# Patient Record
Sex: Female | Born: 1950 | Race: White | Hispanic: No | Marital: Married | Smoking: Never smoker
Health system: Southern US, Community
[De-identification: ages and names within clinical notes are randomized; demographics above are authoritative.]

## PROBLEM LIST (undated history)

## (undated) DIAGNOSIS — Z85828 Personal history of other malignant neoplasm of skin: Secondary | ICD-10-CM

## (undated) DIAGNOSIS — B019 Varicella without complication: Secondary | ICD-10-CM

## (undated) DIAGNOSIS — C4491 Basal cell carcinoma of skin, unspecified: Secondary | ICD-10-CM

## (undated) DIAGNOSIS — K219 Gastro-esophageal reflux disease without esophagitis: Secondary | ICD-10-CM

## (undated) DIAGNOSIS — B029 Zoster without complications: Secondary | ICD-10-CM

## (undated) DIAGNOSIS — M1990 Unspecified osteoarthritis, unspecified site: Secondary | ICD-10-CM

## (undated) HISTORY — PX: DILATION AND CURETTAGE OF UTERUS: SHX78

## (undated) HISTORY — DX: Unspecified osteoarthritis, unspecified site: M19.90

## (undated) HISTORY — DX: Gastro-esophageal reflux disease without esophagitis: K21.9

## (undated) HISTORY — DX: Basal cell carcinoma of skin, unspecified: C44.91

## (undated) HISTORY — PX: TONSILECTOMY/ADENOIDECTOMY WITH MYRINGOTOMY: SHX6125

## (undated) HISTORY — DX: Varicella without complication: B01.9

## (undated) HISTORY — DX: Zoster without complications: B02.9

## (undated) HISTORY — PX: BASAL CELL CARCINOMA EXCISION: SHX1214

---
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
## 2004-09-23 ENCOUNTER — Ambulatory Visit: Payer: Self-pay | Admitting: Obstetrics and Gynecology

## 2005-10-10 ENCOUNTER — Ambulatory Visit: Payer: Self-pay | Admitting: Obstetrics and Gynecology

## 2006-08-17 ENCOUNTER — Ambulatory Visit: Payer: Self-pay | Admitting: Obstetrics and Gynecology

## 2007-10-29 ENCOUNTER — Ambulatory Visit: Payer: Self-pay | Admitting: Obstetrics and Gynecology

## 2008-01-09 DIAGNOSIS — B029 Zoster without complications: Secondary | ICD-10-CM

## 2008-01-09 DIAGNOSIS — B019 Varicella without complication: Secondary | ICD-10-CM

## 2008-01-09 HISTORY — DX: Zoster without complications: B02.9

## 2008-01-09 HISTORY — DX: Varicella without complication: B01.9

## 2008-10-29 ENCOUNTER — Ambulatory Visit: Payer: Self-pay | Admitting: Unknown Physician Specialty

## 2009-11-02 ENCOUNTER — Ambulatory Visit: Payer: Self-pay | Admitting: Unknown Physician Specialty

## 2009-12-06 ENCOUNTER — Ambulatory Visit: Payer: Self-pay | Admitting: Unknown Physician Specialty

## 2010-10-26 ENCOUNTER — Other Ambulatory Visit: Payer: Self-pay | Admitting: Unknown Physician Specialty

## 2010-11-11 ENCOUNTER — Other Ambulatory Visit: Payer: Self-pay | Admitting: Unknown Physician Specialty

## 2010-11-24 ENCOUNTER — Ambulatory Visit: Payer: Self-pay | Admitting: Unknown Physician Specialty

## 2011-12-04 ENCOUNTER — Ambulatory Visit: Payer: Self-pay | Admitting: Internal Medicine

## 2012-03-29 ENCOUNTER — Encounter: Payer: Self-pay | Admitting: Internal Medicine

## 2012-03-29 ENCOUNTER — Ambulatory Visit (INDEPENDENT_AMBULATORY_CARE_PROVIDER_SITE_OTHER): Payer: PRIVATE HEALTH INSURANCE | Admitting: Internal Medicine

## 2012-03-29 ENCOUNTER — Other Ambulatory Visit (HOSPITAL_COMMUNITY)
Admission: RE | Admit: 2012-03-29 | Discharge: 2012-03-29 | Disposition: A | Discharge status: Home / Self Care | Payer: PRIVATE HEALTH INSURANCE | Source: Ambulatory Visit | Attending: Internal Medicine | Admitting: Internal Medicine

## 2012-03-29 VITALS — BP 125/80 | HR 54 | Temp 97.8°F | Ht 72.0 in | Wt 152.0 lb

## 2012-03-29 DIAGNOSIS — Z1151 Encounter for screening for human papillomavirus (HPV): Secondary | ICD-10-CM | POA: Insufficient documentation

## 2012-03-29 DIAGNOSIS — M1990 Unspecified osteoarthritis, unspecified site: Secondary | ICD-10-CM | POA: Insufficient documentation

## 2012-03-29 DIAGNOSIS — Z139 Encounter for screening, unspecified: Secondary | ICD-10-CM

## 2012-03-29 DIAGNOSIS — N951 Menopausal and female climacteric states: Secondary | ICD-10-CM | POA: Insufficient documentation

## 2012-03-29 DIAGNOSIS — K219 Gastro-esophageal reflux disease without esophagitis: Secondary | ICD-10-CM | POA: Insufficient documentation

## 2012-03-29 DIAGNOSIS — Z01419 Encounter for gynecological examination (general) (routine) without abnormal findings: Secondary | ICD-10-CM | POA: Insufficient documentation

## 2012-03-29 MED ORDER — CONJ ESTROG-MEDROXYPROGEST ACE 0.3-1.5 MG PO TABS: ORAL_TABLET | ORAL | Status: DC

## 2012-03-29 MED ORDER — OMEPRAZOLE 20 MG PO CPDR: 20.0000 mg | DELAYED_RELEASE_CAPSULE | Freq: Every day | ORAL | Status: DC

## 2012-03-29 NOTE — Patient Instructions (Signed)
It was nice meeting you today.  I am glad you have been doing well.  Let me know if you need anything.  

## 2012-03-30 ENCOUNTER — Encounter: Payer: Self-pay | Admitting: Internal Medicine

## 2012-03-30 NOTE — Progress Notes (Signed)
Subjective:    Patient ID: Sarah Perry, female    DOB: 06/09/1950, 61 y.o.   MRN: [id number]  HPI 61 year old female with past history of degenerative arthritis, menopausal syndrome and GERD.  She comes in today to follow up on these issues as well as for a complete physical exam.  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  Mostly involves the lateral aspect.  Walking and squatting aggravates.  Started over the summer.  Taking glucosamine.  Has intermittently taken Mobic.  She has also had problems with hemorrhoids.  No bleeding.  Some pain.  No significant constipation.  Had her colonoscopy 2012.  Had labs at work.  States cholesterol ok. On omeprazole.  Controls upper symptoms.  Discussed prempro.  Trying to taper.  Taking every third day now and having some hot flashes and it is affecting her sleep.    Past Medical History  Diagnosis Date  . Degenerative arthritis     hips, knees  . Varicella zoster 10/09  . GERD (gastroesophageal reflux disease)   . Basal cell carcinoma     Current Outpatient Prescriptions on File Prior to Visit  Medication Sig Dispense Refill  . calcium-vitamin D (CALCIUM 500+D) 500-400 MG-UNIT per tablet Take 1 tablet by mouth 2 (two) times daily.      Marland Kitchen estrogen, conjugated,-medroxyprogesterone (PREMPRO) 0.3-1.5 MG per tablet Take one tablet every other day  90 tablet  1  . omeprazole (PRILOSEC) 20 MG capsule Take 1 capsule (20 mg total) by mouth daily.  90 capsule  3    Review of Systems Patient denies any headache, lightheadedness or dizziness.  No sinus or allergy symptoms.  No chest pain, tightness or palpitations.  No increased shortness of breath, cough or congestion.  No nausea or vomiting.  Acid reflux controlled on prilosec.  No abdominal pain or cramping.  No bowel change, such as diarrhea, constipation, BRBPR or melana.  Some problems with hemorrhoids.  No urine change.   No vaginal symptoms.  Some hot  flashes.  Affecting her sleep.      Objective:   Physical Exam Filed Vitals:   03/29/12 0812  BP: 125/80  Pulse: 54  Temp: 97.8 F (88.94 C)   61 year old female in no acute distress.   HEENT:  Nares- clear.  Oropharynx - without lesions. NECK:  Supple.  Nontender.  No audible bruit.  HEART:  Appears to be regular. LUNGS:  No crackles or wheezing audible.  Respirations even and unlabored.  RADIAL PULSE:  Equal bilaterally.    BREASTS:  No nipple discharge or nipple retraction present.  Could not appreciate any distinct nodules or axillary adenopathy.  ABDOMEN:  Soft, nontender.  Bowel sounds present and normal.  No audible abdominal bruit.  GU:  Normal external genitalia.  Vaginal vault without lesions.  Cervix identified.  Pap performed. Could not appreciate any adnexal masses or tenderness.   RECTAL:  Heme negative.   EXTREMITIES:  No increased edema present.  DP pulses palpable and equal bilaterally.      MSK.  Some increased pain with resistance against flexion - right knee.  No instability of the knee joint.      Assessment & Plan:  KNEE PAIN.  Has taken mobic.  Will xray.  May need referral.  Limiting her walking.    HEMORRHOIDS.  Annusol HC suppositories.  Follow.   ESTROGEN USAGE. Discussed estrogen.  Will restart  prempro qod.  Follow.  Will try to taper off.   HEALTH MAINTENANCE.  Physical today.  Schedule mammogram.  Up to date with colonoscopy.  Had in 2012 (?).  Up to date with bone density.

## 2012-03-30 NOTE — Assessment & Plan Note (Signed)
On Prempro.  Taper off as outlined.  Follow.

## 2012-03-30 NOTE — Assessment & Plan Note (Signed)
Reflux controlled on Prilosec.  Follow.   

## 2012-03-30 NOTE — Assessment & Plan Note (Signed)
Hip is better.  Has the knee pain as outlined.  Check xray.

## 2012-04-01 ENCOUNTER — Ambulatory Visit: Payer: Self-pay | Admitting: Internal Medicine

## 2012-04-05 ENCOUNTER — Telehealth: Payer: Self-pay | Admitting: Internal Medicine

## 2012-04-05 NOTE — Telephone Encounter (Signed)
Left detailed message on patient's cell.

## 2012-04-05 NOTE — Telephone Encounter (Signed)
If she has increased pain with walking and it is limiting her walking now - I would recommend to go ahead with physical therapy.  Let me know and I can place order.  If she decides to hold off, we will follow.

## 2012-04-05 NOTE — Telephone Encounter (Signed)
Notify pt knee xray revealed no acute fracture.  Normal alignment.  No acute abnormality.  Given persistent pain, would like to refer to physical therapy for evaluation and treatment.

## 2012-04-05 NOTE — Telephone Encounter (Signed)
Called patient with results. Patient stated that walking a lot on knee hurts, however patient would like to wait until weather is warmer and she begins her regular exercise to see if she is still having pain. She wanted me to ask you if you thought it was a mistake to wait or should she go ahead and seek therapist now instead of holding off? Please advise

## 2012-04-14 ENCOUNTER — Telehealth: Payer: Self-pay | Admitting: Internal Medicine

## 2012-04-14 NOTE — Telephone Encounter (Signed)
Sarah Perry needs a follow up appt with me in 4 months.  Apparently she left without scheduling.  Thanks.

## 2012-04-23 NOTE — Telephone Encounter (Signed)
Scheduled

## 2012-04-29 ENCOUNTER — Encounter: Payer: Self-pay | Admitting: Internal Medicine

## 2012-05-09 ENCOUNTER — Ambulatory Visit: Payer: Self-pay | Admitting: Internal Medicine

## 2012-05-21 ENCOUNTER — Encounter: Payer: Self-pay | Admitting: Internal Medicine

## 2012-05-25 ENCOUNTER — Other Ambulatory Visit: Payer: Self-pay

## 2012-06-23 ENCOUNTER — Encounter: Payer: Self-pay | Admitting: Internal Medicine

## 2012-06-25 ENCOUNTER — Telehealth: Payer: Self-pay | Admitting: Internal Medicine

## 2012-06-25 DIAGNOSIS — M25561 Pain in right knee: Secondary | ICD-10-CM

## 2012-06-25 NOTE — Telephone Encounter (Signed)
Left detailed message on patient's home voicemail.  

## 2012-06-25 NOTE — Telephone Encounter (Signed)
Notify pt I have placed an order for referral to physical therapy (through the hospital) - for persistent knee pain.  Amber should be calling with an appt.

## 2012-07-02 ENCOUNTER — Encounter: Payer: Self-pay | Admitting: Internal Medicine

## 2012-07-09 ENCOUNTER — Encounter: Payer: Self-pay | Admitting: Internal Medicine

## 2012-08-26 ENCOUNTER — Ambulatory Visit (INDEPENDENT_AMBULATORY_CARE_PROVIDER_SITE_OTHER): Payer: 59 | Admitting: Internal Medicine

## 2012-08-26 ENCOUNTER — Encounter: Payer: Self-pay | Admitting: Internal Medicine

## 2012-08-26 ENCOUNTER — Encounter: Payer: Self-pay | Admitting: Emergency Medicine

## 2012-08-26 VITALS — BP 120/80 | HR 58 | Temp 98.3°F | Ht 72.0 in | Wt 155.5 lb

## 2012-08-26 DIAGNOSIS — N951 Menopausal and female climacteric states: Secondary | ICD-10-CM

## 2012-08-26 DIAGNOSIS — M1990 Unspecified osteoarthritis, unspecified site: Secondary | ICD-10-CM

## 2012-08-26 DIAGNOSIS — K219 Gastro-esophageal reflux disease without esophagitis: Secondary | ICD-10-CM

## 2012-08-26 DIAGNOSIS — N898 Other specified noninflammatory disorders of vagina: Secondary | ICD-10-CM

## 2012-08-26 MED ORDER — MELOXICAM 7.5 MG PO TABS: 7.5000 mg | ORAL_TABLET | Freq: Every day | ORAL | Status: DC | PRN

## 2012-08-26 NOTE — Progress Notes (Signed)
Subjective:    Patient ID: Sarah Perry, female    DOB: [birth year], 62 y.o.   MRN: [id number]  HPI 62 year old female with past history of degenerative arthritis, menopausal syndrome and GERD.  She comes in today for a scheduled follow up.   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  See last note for details.  Walking and squatting aggravates.   Intermittently takes Mobic.  Had xray.  Went to therapy.  She is doing the exercises as instructed.  Had her colonoscopy 2012.  On omeprazole.  Controls upper symptoms.  Discussed prempro.  Was trying to taper.  Was taking every third day and was having some hot flashes.  Affecting her sleep.  She is now taking it qod and doing well with this.  States she has a persistent vaginal discharge.  If she skips her prempro - discharge is darker.  No cramping.      Past Medical History  Diagnosis Date  . Degenerative arthritis     hips, knees  . Varicella zoster 10/09  . GERD (gastroesophageal reflux disease)   . Basal cell carcinoma     Current Outpatient Prescriptions on File Prior to Visit  Medication Sig Dispense Refill  . calcium-vitamin D (CALCIUM 500+D) 500-400 MG-UNIT per tablet Take 1 tablet by mouth 2 (two) times daily.      Marland Kitchen estrogen, conjugated,-medroxyprogesterone (PREMPRO) 0.3-1.5 MG per tablet Take one tablet every other day  90 tablet  1  . glucosamine-chondroitin 500-400 MG tablet Take 1 tablet by mouth daily.      . Multiple Vitamin (MULTIVITAMIN) tablet Take 1 tablet by mouth daily.      Marland Kitchen omeprazole (PRILOSEC) 20 MG capsule Take 1 capsule (20 mg total) by mouth daily.  90 capsule  3   No current facility-administered medications on file prior to visit.    Review of Systems Patient denies any headache, lightheadedness or dizziness.  No sinus or allergy symptoms.  No chest pain, tightness or palpitations.  No increased shortness of breath, cough or congestion.  No nausea or vomiting.   Acid reflux controlled on prilosec.  No abdominal pain or cramping.  No bowel change, such as diarrhea, constipation, BRBPR or melana.  No urine change.   Vaginal discharge as outlined.        Objective:   Physical Exam  Filed Vitals:   08/26/12 0807  BP: 120/80  Pulse: 58  Temp: 98.3 F (59.24 C)   63 year old female in no acute distress.   HEENT:  Nares- clear.  Oropharynx - without lesions. NECK:  Supple.  Nontender.  No audible bruit.  HEART:  Appears to be regular. LUNGS:  No crackles or wheezing audible.  Respirations even and unlabored.  RADIAL PULSE:  Equal bilaterally.  ABDOMEN:  Soft, nontender.  Bowel sounds present and normal.  No audible abdominal bruit.  EXTREMITIES:  No increased edema present.  DP pulses palpable and equal bilaterally.      Assessment & Plan:  KNEE PAIN.  Has taken mobic.  Refilled.  Had xray.  Referred to therapy.  Will notify me if she feels she needs something more.     HEMORRHOIDS.  Did not report as an issue today.  Follow.   GYN.  On prempro qod. Hot flashes resolved.  With the discharge as outlined.  Refer to gyn for evaluation and question of need for further w/up (ie pelvic  US, endometrial bx, etc).  Will refer to Dr Greggory Keen.    HEALTH MAINTENANCE.  Physical last visit.  Mammogram 05/09/12 - Birads II.   Up to date with colonoscopy.  Had in 2012.   Up to date with bone density.

## 2012-09-02 ENCOUNTER — Encounter: Payer: Self-pay | Admitting: Internal Medicine

## 2012-09-02 NOTE — Assessment & Plan Note (Signed)
Hip is better.  Knee pain as outlined. Xray as outlined. Has been to physical therapy.  Doing the exercises.  Follow.  Will notify me if she desires any further intervention.

## 2012-09-02 NOTE — Assessment & Plan Note (Signed)
On Prempro.  Refer to gyn as outlined.  Vaginal discharge.

## 2012-09-02 NOTE — Assessment & Plan Note (Signed)
Reflux controlled on Prilosec.  Follow.   

## 2013-02-13 ENCOUNTER — Other Ambulatory Visit: Payer: Self-pay

## 2013-03-03 ENCOUNTER — Other Ambulatory Visit: Payer: Self-pay | Admitting: *Deleted

## 2013-03-03 ENCOUNTER — Encounter: Payer: Self-pay | Admitting: Internal Medicine

## 2013-03-03 MED ORDER — CONJ ESTROG-MEDROXYPROGEST ACE 0.3-1.5 MG PO TABS: ORAL_TABLET | ORAL | Status: DC

## 2013-03-28 ENCOUNTER — Encounter: Payer: 59 | Admitting: Internal Medicine

## 2013-03-31 ENCOUNTER — Ambulatory Visit (INDEPENDENT_AMBULATORY_CARE_PROVIDER_SITE_OTHER): Payer: 59 | Admitting: Internal Medicine

## 2013-03-31 ENCOUNTER — Encounter: Payer: Self-pay | Admitting: Internal Medicine

## 2013-03-31 VITALS — BP 110/70 | HR 61 | Temp 98.4°F | Ht 71.0 in | Wt 157.5 lb

## 2013-03-31 DIAGNOSIS — Z1239 Encounter for other screening for malignant neoplasm of breast: Secondary | ICD-10-CM

## 2013-03-31 DIAGNOSIS — Z13220 Encounter for screening for lipoid disorders: Secondary | ICD-10-CM

## 2013-03-31 DIAGNOSIS — N951 Menopausal and female climacteric states: Secondary | ICD-10-CM

## 2013-03-31 DIAGNOSIS — M1990 Unspecified osteoarthritis, unspecified site: Secondary | ICD-10-CM

## 2013-03-31 DIAGNOSIS — K219 Gastro-esophageal reflux disease without esophagitis: Secondary | ICD-10-CM

## 2013-03-31 MED ORDER — OMEPRAZOLE 20 MG PO CPDR: 20.0000 mg | DELAYED_RELEASE_CAPSULE | Freq: Every day | ORAL | Status: DC

## 2013-03-31 MED ORDER — CONJ ESTROG-MEDROXYPROGEST ACE 0.3-1.5 MG PO TABS: ORAL_TABLET | ORAL | Status: DC

## 2013-03-31 NOTE — Progress Notes (Signed)
Pre-visit discussion using our clinic review tool. No additional management support is needed unless otherwise documented below in the visit note.  

## 2013-03-31 NOTE — Progress Notes (Signed)
Subjective:    Patient ID: Sarah Perry, female    DOB: 04-Jan-1951, 62 y.o.   MRN: [id number]  HPI 62 year old female with past history of degenerative arthritis, menopausal syndrome and GERD.  She comes in today to follow up on these issues as well as for a complete physical exam.   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  See last note for details.  Walking and squatting aggravates.   Intermittently takes Mobic.  Had xray.  Went to therapy.  She is doing the exercises as instructed.  Had her colonoscopy 2012.  On omeprazole.  Controls upper symptoms.  Discussed prempro.  Was trying to taper.  Was taking every third day and was having some hot flashes.  Affecting her sleep.  She is now taking it qod and doing well with this.  Controls her symptoms.  Had gyn evaluation for her vaginal discharge.  States everything checked out fine.  Discharge resolved.  No further w/up felt warranted.   Overall she feels good.      Past Medical History  Diagnosis Date  . Degenerative arthritis     hips, knees  . Varicella zoster 10/09  . GERD (gastroesophageal reflux disease)   . Basal cell carcinoma     Current Outpatient Prescriptions on File Prior to Visit  Medication Sig Dispense Refill  . calcium-vitamin D (CALCIUM 500+D) 500-400 MG-UNIT per tablet Take 1 tablet by mouth 2 (two) times daily.      Marland Kitchen estrogen, conjugated,-medroxyprogesterone (PREMPRO) 0.3-1.5 MG per tablet Take one tablet every other day  15 tablet  0  . glucosamine-chondroitin 500-400 MG tablet Take 1 tablet by mouth daily.      . meloxicam (MOBIC) 7.5 MG tablet Take 1 tablet (7.5 mg total) by mouth daily as needed for pain.  30 tablet  1  . Multiple Vitamin (MULTIVITAMIN) tablet Take 1 tablet by mouth daily.      Marland Kitchen omeprazole (PRILOSEC) 20 MG capsule Take 1 capsule (20 mg total) by mouth daily.  90 capsule  3   No current facility-administered medications on file prior to visit.     Review of Systems Patient denies any headache, lightheadedness or dizziness.  No sinus or allergy symptoms.  No chest pain, tightness or palpitations.  No increased shortness of breath, cough or congestion.  No nausea or vomiting.  Acid reflux controlled on prilosec.  No abdominal pain or cramping.  No bowel change, such as diarrhea, constipation, BRBPR or melana.  No urine change.   Vaginal discharge resolved.  Saw gyn  Felt no further w/up warranted.  Overall she feels she is doing well.       Objective:   Physical Exam  Filed Vitals:   03/31/13 0807  BP: 110/70  Pulse: 61  Temp: 98.4 F (11.74 C)   62 year old female in no acute distress.   HEENT:  Nares- clear.  Oropharynx - without lesions. NECK:  Supple.  Nontender.  No audible bruit.  HEART:  Appears to be regular. LUNGS:  No crackles or wheezing audible.  Respirations even and unlabored.  RADIAL PULSE:  Equal bilaterally.    BREASTS:  No nipple discharge or nipple retraction present.  Could not appreciate any distinct nodules or axillary adenopathy.  ABDOMEN:  Soft, nontender.  Bowel sounds present and normal.  No audible abdominal bruit.  GU:  Not performed.    EXTREMITIES:  No increased  edema present.  DP pulses palpable and equal bilaterally.          Assessment & Plan:  KNEE PAIN.  Mobic prn.  Had xray.  Referred to therapy.  Will notify me if she feels she needs something more.     GYN.  On prempro.  Wants to remain on the prempro.  No further vaginal discharge.  Saw gyn.  Felt no further w/up warranted.     HEALTH MAINTENANCE.  Physical today.   Mammogram 05/09/12 - Birads II.   Schedule a f/u mammogram when due.  Up to date with colonoscopy.  Had in 2012.   Up to date with bone density.

## 2013-04-04 ENCOUNTER — Encounter: Payer: Self-pay | Admitting: Internal Medicine

## 2013-04-04 NOTE — Assessment & Plan Note (Signed)
Reflux controlled on Prilosec.  Follow.   

## 2013-04-04 NOTE — Assessment & Plan Note (Signed)
On Prempro.  Refer to gyn as outlined.  Discharged resolved.  No further w/up felt warranted.

## 2013-04-04 NOTE — Assessment & Plan Note (Signed)
Hip is better.  Knee pain as outlined. Xray as outlined. Has been to physical therapy.  Doing the exercises.  Follow.  Will notify me if she desires any further intervention.  Stable.

## 2013-04-09 ENCOUNTER — Other Ambulatory Visit (INDEPENDENT_AMBULATORY_CARE_PROVIDER_SITE_OTHER): Payer: 59

## 2013-04-09 DIAGNOSIS — K219 Gastro-esophageal reflux disease without esophagitis: Secondary | ICD-10-CM

## 2013-04-09 DIAGNOSIS — Z13220 Encounter for screening for lipoid disorders: Secondary | ICD-10-CM

## 2013-04-09 LAB — LIPID PANEL
Cholesterol: 200 mg/dL (ref 0–200)
Triglycerides: 37 mg/dL (ref 0.0–149.0)

## 2013-04-09 LAB — COMPREHENSIVE METABOLIC PANEL
BUN: 18 mg/dL (ref 6–23)
CO2: 32 mEq/L (ref 19–32)
GFR: 78.82 mL/min (ref >60.00)
Glucose, Bld: 91 mg/dL (ref 70–99)
Sodium: 140 mEq/L (ref 135–145)
Total Bilirubin: 0.7 mg/dL (ref 0.3–1.2)
Total Protein: 6.6 g/dL (ref 6.0–8.3)

## 2013-04-09 LAB — CBC WITH DIFFERENTIAL/PLATELET
Basophils Relative: 0.7 % (ref 0.0–3.0)
Eosinophils Relative: 2.5 % (ref 0.0–5.0)
HCT: 37.3 % (ref 36.0–46.0)
Hemoglobin: 12.7 g/dL (ref 12.0–15.0)
Lymphs Abs: 1.4 10*3/uL (ref 0.7–4.0)
Monocytes Relative: 8.1 % (ref 3.0–12.0)
Neutro Abs: 2.7 10*3/uL (ref 1.4–7.7)
RBC: 4.09 Mil/uL (ref 3.87–5.11)
WBC: 4.6 10*3/uL (ref 4.5–10.5)

## 2013-04-09 LAB — TSH: TSH: 3 u[IU]/mL (ref 0.35–5.50)

## 2013-04-10 ENCOUNTER — Encounter: Payer: Self-pay | Admitting: Internal Medicine

## 2013-05-12 ENCOUNTER — Encounter: Payer: Self-pay | Admitting: Internal Medicine

## 2013-05-12 ENCOUNTER — Ambulatory Visit: Payer: Self-pay | Admitting: Internal Medicine

## 2013-05-12 LAB — HM MAMMOGRAPHY: HM MAMMO: NEGATIVE

## 2013-08-18 ENCOUNTER — Other Ambulatory Visit: Payer: Self-pay | Admitting: Internal Medicine

## 2013-10-01 ENCOUNTER — Ambulatory Visit (INDEPENDENT_AMBULATORY_CARE_PROVIDER_SITE_OTHER): Payer: 59 | Admitting: Internal Medicine

## 2013-10-01 ENCOUNTER — Encounter: Payer: Self-pay | Admitting: Internal Medicine

## 2013-10-01 VITALS — BP 110/70 | HR 63 | Temp 98.2°F | Ht 71.0 in | Wt 152.5 lb

## 2013-10-01 DIAGNOSIS — L989 Disorder of the skin and subcutaneous tissue, unspecified: Secondary | ICD-10-CM

## 2013-10-01 DIAGNOSIS — K219 Gastro-esophageal reflux disease without esophagitis: Secondary | ICD-10-CM

## 2013-10-01 DIAGNOSIS — N951 Menopausal and female climacteric states: Secondary | ICD-10-CM

## 2013-10-01 DIAGNOSIS — M25569 Pain in unspecified knee: Secondary | ICD-10-CM

## 2013-10-01 NOTE — Progress Notes (Signed)
Pre visit review using our clinic review tool, if applicable. No additional management support is needed unless otherwise documented below in the visit note. 

## 2013-10-05 ENCOUNTER — Encounter: Payer: Self-pay | Admitting: Internal Medicine

## 2013-10-05 DIAGNOSIS — M25569 Pain in unspecified knee: Secondary | ICD-10-CM | POA: Insufficient documentation

## 2013-10-05 DIAGNOSIS — L989 Disorder of the skin and subcutaneous tissue, unspecified: Secondary | ICD-10-CM | POA: Insufficient documentation

## 2013-10-05 NOTE — Assessment & Plan Note (Signed)
Had xray and went to therapy.  Takes ibuprofen prn.  Stable.  Desires no further intervention.  Follow.

## 2013-10-05 NOTE — Assessment & Plan Note (Signed)
Reflux controlled on Prilosec.  Follow.

## 2013-10-05 NOTE — Assessment & Plan Note (Signed)
On Prempro.  Referred to gyn as outlined.  Discharged resolved.  No further w/up felt warranted.

## 2013-10-05 NOTE — Progress Notes (Signed)
Subjective:    Patient ID: Sarah Perry, female    DOB: [birth year], 63 y.o.   MRN: [id number]  HPI 63 year old female with past history of degenerative arthritis, menopausal syndrome and GERD.  She comes in today for a scheduled follow up.   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  Works at the hospital.   Some intermittent knee pain.  Had xray.  Went to therapy.  She is doing the exercises as instructed. Desires no further intervention.   Had her colonoscopy 2012.  On omeprazole.  Controls upper symptoms.  Discussed prempro.  Was trying to taper.  Was taking every third day and was having some hot flashes.  Affecting her sleep.  She is now taking it qod and doing well with this.  Controls her symptoms.  Had gyn evaluation for her vaginal discharge.  States everything checked out fine.  Discharge resolved.  No further w/up felt warranted.   Overall she feels good.      Past Medical History  Diagnosis Date  . Degenerative arthritis     hips, knees  . Varicella zoster 10/09  . GERD (gastroesophageal reflux disease)   . Basal cell carcinoma     Current Outpatient Prescriptions on File Prior to Visit  Medication Sig Dispense Refill  . calcium-vitamin D (CALCIUM 500+D) 500-400 MG-UNIT per tablet Take 1 tablet by mouth 2 (two) times daily.      Marland Kitchen glucosamine-chondroitin 500-400 MG tablet Take 1 tablet by mouth daily.      . Multiple Vitamin (MULTIVITAMIN) tablet Take 1 tablet by mouth daily.      Marland Kitchen omeprazole (PRILOSEC) 20 MG capsule Take 1 capsule (20 mg total) by mouth daily.  90 capsule  3  . PREMPRO 0.3-1.5 MG per tablet TAKE ONE TABLET EVERY OTHER DAY  28 tablet  5   No current facility-administered medications on file prior to visit.    Review of Systems Patient denies any headache, lightheadedness or dizziness.  No sinus or allergy symptoms.  No chest pain, tightness or palpitations.  No increased shortness of breath, cough or congestion.  No nausea or vomiting.  Acid reflux controlled on  prilosec.  No abdominal pain or cramping.  No bowel change, such as diarrhea, constipation, BRBPR or melana.  No urine change.   Vaginal discharge resolved.  Saw gyn  Felt no further w/up warranted.  Overall she feels she is doing well.       Objective:   Physical Exam  Filed Vitals:   10/01/13 0755  BP: 110/70  Pulse: 63  Temp: 98.2 F (51.31 C)   63 year old female in no acute distress.   HEENT:  Nares- clear.  Oropharynx - without lesions. NECK:  Supple.  Nontender.  No audible bruit.  HEART:  Appears to be regular. LUNGS:  No crackles or wheezing audible.  Respirations even and unlabored.  RADIAL PULSE:  Equal bilaterally.   ABDOMEN:  Soft, nontender.  Bowel sounds present and normal.  No audible abdominal bruit.   EXTREMITIES:  No increased edema present.  DP pulses palpable and equal bilaterally.          Assessment & Plan:  GYN.  On prempro.  Wants to remain on the prempro.  No further vaginal discharge.  Saw gyn.  Felt no further w/up warranted.     HEALTH MAINTENANCE.  Physical 03/31/13.   Mammogram 05/12/13 - Birads I.   Up to date with colonoscopy.  Had in 2012.  Up to date with bone density.

## 2013-10-05 NOTE — Assessment & Plan Note (Signed)
Persistent right knee lesion and leg lesion.  Refer back to dermatology.

## 2013-10-27 ENCOUNTER — Encounter: Payer: Self-pay | Admitting: Internal Medicine

## 2014-03-27 ENCOUNTER — Encounter: Payer: Self-pay | Admitting: Internal Medicine

## 2014-03-27 ENCOUNTER — Ambulatory Visit (INDEPENDENT_AMBULATORY_CARE_PROVIDER_SITE_OTHER): Payer: 59 | Admitting: Internal Medicine

## 2014-03-27 VITALS — BP 110/70 | HR 68 | Temp 98.5°F | Ht 70.5 in | Wt 156.0 lb

## 2014-03-27 DIAGNOSIS — Z13220 Encounter for screening for lipoid disorders: Secondary | ICD-10-CM

## 2014-03-27 DIAGNOSIS — N951 Menopausal and female climacteric states: Secondary | ICD-10-CM

## 2014-03-27 DIAGNOSIS — K219 Gastro-esophageal reflux disease without esophagitis: Secondary | ICD-10-CM

## 2014-03-27 DIAGNOSIS — L989 Disorder of the skin and subcutaneous tissue, unspecified: Secondary | ICD-10-CM

## 2014-03-27 DIAGNOSIS — Z1239 Encounter for other screening for malignant neoplasm of breast: Secondary | ICD-10-CM

## 2014-03-27 DIAGNOSIS — M25569 Pain in unspecified knee: Secondary | ICD-10-CM

## 2014-03-27 NOTE — Progress Notes (Signed)
Pre visit review using our clinic review tool, if applicable. No additional management support is needed unless otherwise documented below in the visit note. 

## 2014-03-27 NOTE — Progress Notes (Signed)
Subjective:    Patient ID: Sarah Perry, female    DOB: 06-Apr-1951, 63 y.o.   MRN: [id number]  HPI 63 year old female with past history of degenerative arthritis, menopausal syndrome and GERD.  She comes in today to follow up on these issues as well as for a complete physical exam.  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  Works at the hospital.   Some intermittent knee pain.  Had xray.  Went to therapy.  She is doing the exercises as instructed. Desires no further intervention.   Had her colonoscopy 2012.  On omeprazole.  Controls upper symptoms.  Discussed prempro.  Was trying to taper.  Was taking every third day and was having some hot flashes.  Affecting her sleep.  She is now taking it qod and doing well with this.  We again disussed a slow taper.  Will try.   Had gyn evaluation for her vaginal discharge.  States everything checked out fine.  Discharge resolved.  No further w/up felt warranted.   Overall she feels good.      Past Medical History  Diagnosis Date  . Degenerative arthritis     hips, knees  . Varicella zoster 10/09  . GERD (gastroesophageal reflux disease)   . Basal cell carcinoma     Current Outpatient Prescriptions on File Prior to Visit  Medication Sig Dispense Refill  . calcium-vitamin D (CALCIUM 500+D) 500-400 MG-UNIT per tablet Take 1 tablet by mouth daily.     Marland Kitchen glucosamine-chondroitin 500-400 MG tablet Take 2 tablets by mouth daily.     Marland Kitchen ibuprofen (ADVIL,MOTRIN) 200 MG tablet Take 200 mg by mouth daily.    . Multiple Vitamin (MULTIVITAMIN) tablet Take 1 tablet by mouth daily.    Marland Kitchen omeprazole (PRILOSEC) 20 MG capsule Take 1 capsule (20 mg total) by mouth daily. 90 capsule 3  . PREMPRO 0.3-1.5 MG per tablet TAKE ONE TABLET EVERY OTHER DAY 28 tablet 5   No current facility-administered medications on file prior to visit.    Review of Systems Patient denies any headache, lightheadedness or dizziness.  No sinus or allergy symptoms.  No chest pain, tightness or  palpitations. No increased shortness of breath, cough or congestion.  No nausea or vomiting.  Acid reflux controlled on prilosec.  No abdominal pain or cramping.  No bowel change, such as diarrhea, constipation, BRBPR or melana.  No urine change.   Vaginal discharge resolved.  Saw gyn  Felt no further w/up warranted.  Overall she feels she is doing well.  Stays active.  Knee ok.       Objective:   Physical Exam  Filed Vitals:   03/27/14 0834  BP: 110/70  Pulse: 68  Temp: 98.5 F (36.9 C)   Blood pressure recheck 30/89  63 year old female in no acute distress.   HEENT:  Nares- clear.  Oropharynx - without lesions. NECK:  Supple.  Nontender.  No audible bruit.  HEART:  Appears to be regular. LUNGS:  No crackles or wheezing audible.  Respirations even and unlabored.  RADIAL PULSE:  Equal bilaterally.    BREASTS:  No nipple discharge or nipple retraction present.  Could not appreciate any distinct nodules or axillary adenopathy.  ABDOMEN:  Soft, nontender.  Bowel sounds present and normal.  No audible abdominal bruit.  GU:  Not performed.    EXTREMITIES:  No increased edema present.  DP pulses palpable and equal bilaterally.          Assessment &  Plan:  1. Breast cancer screening - MM DIGITAL SCREENING BILATERAL; Future  2. Gastroesophageal reflux disease without esophagitis Symptoms controlled.  On omeprazole.    - CBC with Differential; Future - Comprehensive metabolic panel; Future  3. Menopausal syndrome See above for slow taper instructions.  Symptoms controlled on qod dosing.   - TSH; Future  4. Skin lesions Saw Willeen Niece.  See her note for details.  Continue to f/u with dermatology.    5. Knee pain, unspecified laterality Stable.  Better.  Continue exercise.  Follow.    6. Screening cholesterol level Low cholesterol diet.   - Lipid panel; Future  7. GYN.  On prempro.  We discussed tapering the prempro gradually.  She will again attempt a slow taper.  No  further vaginal discharge.  Saw gyn.  Felt no further w/up warranted.     HEALTH MAINTENANCE.  Physical today.   Mammogram 05/12/13 - Birads I.   Schedule f/u mammogram.  Up to date with colonoscopy. Had in 2012.   States due f/u in 10 years.  Up to date with bone density.   I spent 25 minutes with the patient and more than 50% of the time was spent in consultation regarding the above.

## 2014-04-05 ENCOUNTER — Encounter: Payer: Self-pay | Admitting: Internal Medicine

## 2014-04-22 ENCOUNTER — Other Ambulatory Visit (INDEPENDENT_AMBULATORY_CARE_PROVIDER_SITE_OTHER): Payer: 59

## 2014-04-22 DIAGNOSIS — N951 Menopausal and female climacteric states: Secondary | ICD-10-CM

## 2014-04-22 DIAGNOSIS — Z13220 Encounter for screening for lipoid disorders: Secondary | ICD-10-CM

## 2014-04-22 DIAGNOSIS — K219 Gastro-esophageal reflux disease without esophagitis: Secondary | ICD-10-CM

## 2014-04-22 LAB — CBC WITH DIFFERENTIAL/PLATELET
Basophils Absolute: 0 10*3/uL (ref 0.0–0.1)
Basophils Relative: 0.9 % (ref 0.0–3.0)
Eosinophils Absolute: 0.1 10*3/uL (ref 0.0–0.7)
Eosinophils Relative: 2.2 % (ref 0.0–5.0)
HCT: 38.3 % (ref 36.0–46.0)
HEMOGLOBIN: 12.7 g/dL (ref 12.0–15.0)
LYMPHS PCT: 34.8 % (ref 12.0–46.0)
Lymphs Abs: 1.7 10*3/uL (ref 0.7–4.0)
MCHC: 33.1 g/dL (ref 30.0–36.0)
MCV: 91 fl (ref 78.0–100.0)
MONOS PCT: 8.5 % (ref 3.0–12.0)
Monocytes Absolute: 0.4 10*3/uL (ref 0.1–1.0)
NEUTROS ABS: 2.5 10*3/uL (ref 1.4–7.7)
Neutrophils Relative %: 53.6 % (ref 43.0–77.0)
PLATELETS: 231 10*3/uL (ref 150.0–400.0)
RBC: 4.21 Mil/uL (ref 3.87–5.11)
RDW: 12.4 % (ref 11.5–15.5)
WBC: 4.8 10*3/uL (ref 4.0–10.5)

## 2014-04-22 LAB — COMPREHENSIVE METABOLIC PANEL
ALT: 20 U/L (ref 0–35)
AST: 29 U/L (ref 0–37)
Albumin: 4.3 g/dL (ref 3.5–5.2)
Alkaline Phosphatase: 97 U/L (ref 39–117)
BILIRUBIN TOTAL: 0.4 mg/dL (ref 0.2–1.2)
BUN: 11 mg/dL (ref 6–23)
CO2: 29 mEq/L (ref 19–32)
Calcium: 9.6 mg/dL (ref 8.4–10.5)
Chloride: 103 mEq/L (ref 96–112)
Creatinine, Ser: 0.76 mg/dL (ref 0.40–1.20)
GFR: 82.14 mL/min (ref >60.00)
Glucose, Bld: 98 mg/dL (ref 70–99)
Potassium: 4.5 mEq/L (ref 3.5–5.1)
Sodium: 140 mEq/L (ref 135–145)
TOTAL PROTEIN: 7 g/dL (ref 6.0–8.3)

## 2014-04-22 LAB — LIPID PANEL
Cholesterol: 205 mg/dL — ABNORMAL HIGH (ref 0–200)
HDL: 91 mg/dL (ref >39.00)
LDL Cholesterol: 103 mg/dL — ABNORMAL HIGH (ref 0–99)
NONHDL: 114
Total CHOL/HDL Ratio: 2
Triglycerides: 56 mg/dL (ref 0.0–149.0)
VLDL: 11.2 mg/dL (ref 0.0–40.0)

## 2014-04-22 LAB — TSH: TSH: 3.2 u[IU]/mL (ref 0.35–4.50)

## 2014-04-23 ENCOUNTER — Encounter: Payer: Self-pay | Admitting: Internal Medicine

## 2014-04-27 NOTE — Telephone Encounter (Signed)
unread mychart message mailed to patient

## 2014-05-05 ENCOUNTER — Other Ambulatory Visit: Payer: Self-pay | Admitting: Internal Medicine

## 2014-05-13 ENCOUNTER — Ambulatory Visit: Payer: Self-pay | Admitting: Internal Medicine

## 2014-05-13 LAB — HM MAMMOGRAPHY: HM Mammogram: NEGATIVE

## 2014-09-21 ENCOUNTER — Other Ambulatory Visit: Payer: Self-pay | Admitting: Internal Medicine

## 2014-09-21 NOTE — Telephone Encounter (Signed)
ok'd refill prempro #[id number] with no refills.

## 2014-09-21 NOTE — Telephone Encounter (Signed)
Please advise refill.  03/2014 CPE appt you discussed tapering

## 2014-09-23 ENCOUNTER — Encounter: Payer: Self-pay | Admitting: Internal Medicine

## 2014-10-05 ENCOUNTER — Ambulatory Visit: Payer: 59 | Admitting: Internal Medicine

## 2014-11-29 ENCOUNTER — Other Ambulatory Visit: Payer: Self-pay | Admitting: Internal Medicine

## 2014-11-30 ENCOUNTER — Other Ambulatory Visit: Payer: Self-pay | Admitting: *Deleted

## 2014-11-30 ENCOUNTER — Other Ambulatory Visit: Payer: Self-pay | Admitting: Internal Medicine

## 2014-11-30 MED ORDER — CONJ ESTROG-MEDROXYPROGEST ACE 0.3-1.5 MG PO TABS: ORAL_TABLET | ORAL | Status: DC

## 2014-11-30 NOTE — Progress Notes (Signed)
Refilled prempro #28 with no refills.

## 2014-12-08 DIAGNOSIS — C4491 Basal cell carcinoma of skin, unspecified: Secondary | ICD-10-CM

## 2014-12-08 HISTORY — DX: Basal cell carcinoma of skin, unspecified: C44.91

## 2014-12-10 ENCOUNTER — Encounter: Payer: Self-pay | Admitting: Internal Medicine

## 2014-12-10 ENCOUNTER — Ambulatory Visit (INDEPENDENT_AMBULATORY_CARE_PROVIDER_SITE_OTHER): Payer: 59 | Admitting: Internal Medicine

## 2014-12-10 VITALS — BP 110/70 | HR 59 | Temp 98.7°F | Ht 70.5 in | Wt 157.4 lb

## 2014-12-10 DIAGNOSIS — M179 Osteoarthritis of knee, unspecified: Secondary | ICD-10-CM | POA: Diagnosis not present

## 2014-12-10 DIAGNOSIS — K219 Gastro-esophageal reflux disease without esophagitis: Secondary | ICD-10-CM

## 2014-12-10 DIAGNOSIS — R42 Dizziness and giddiness: Secondary | ICD-10-CM | POA: Diagnosis not present

## 2014-12-10 DIAGNOSIS — M1710 Unilateral primary osteoarthritis, unspecified knee: Secondary | ICD-10-CM

## 2014-12-10 NOTE — Progress Notes (Signed)
Pre-visit discussion using our clinic review tool. No additional management support is needed unless otherwise documented below in the visit note.  

## 2014-12-10 NOTE — Progress Notes (Signed)
Patient ID: Sarah Perry, female   DOB: February 12, 1951, 64 y.o.   MRN: [id number]   Subjective:    Patient ID: Sarah Perry, female    DOB: Aug 13, 1950, 64 y.o.   MRN: [id number]  HPI  Patient here for a scheduled follow up.  States she had an episode in 06/2014 - woke up - room spinning.  This is the third time in her life has occurred. Has been years since has happened previously.  Took meclizine previously.  This resolved and she has not had reoccurrence.  No headache.  No dizziness.  Stays active.  No cardiac symptoms with increased activity or exertion.  No sob.  No acid reflux.  No abdominal pain or cramping.  Bowels stable.     Past Medical History  Diagnosis Date  . Degenerative arthritis     hips, knees  . Varicella zoster 10/09  . GERD (gastroesophageal reflux disease)   . Basal cell carcinoma    Past Surgical History  Procedure Laterality Date  . Tonsilectomy/adenoidectomy with myringotomy    . Dilation and curettage of uterus    . Basal cell carcinoma excision     Family History  Problem Relation Age of Onset  . Ovarian cancer Mother   . Dementia Father   . Breast cancer Paternal Aunt   . Prostate cancer Father   . Hypercholesterolemia Mother   . Pancreatic cancer Maternal Aunt    Social History   Social History  . Marital Status: Married    Spouse Name: N/A  . Number of Children: 2  . Years of Education: N/A   Social History Main Topics  . Smoking status: Never Smoker   . Smokeless tobacco: Never Used  . Alcohol Use: 0.0 oz/week    0 Standard drinks or equivalent per week     Comment: glass of wine daily  . Drug Use: No  . Sexual Activity: Not Asked   Other Topics Concern  . None   Social History Narrative    Outpatient Encounter Prescriptions as of 12/10/2014  Medication Sig  . calcium-vitamin D (CALCIUM 500+D) 500-400 MG-UNIT per tablet Take 1 tablet by mouth daily.   Marland Kitchen estrogen, conjugated,-medroxyprogesterone (PREMPRO) 0.3-1.5 MG per tablet TAKE ONE  TABLET EVERY OTHER DAY  . glucosamine-chondroitin 500-400 MG tablet Take 2 tablets by mouth daily.   Marland Kitchen ibuprofen (ADVIL,MOTRIN) 200 MG tablet Take 200 mg by mouth daily.  . Multiple Vitamin (MULTIVITAMIN) tablet Take 1 tablet by mouth daily.  Marland Kitchen omeprazole (PRILOSEC) 20 MG capsule TAKE 1 CAPSULE BY MOUTH DAILY.   No facility-administered encounter medications on file as of 12/10/2014.    Review of Systems  Constitutional: Negative for appetite change and unexpected weight change.  HENT: Negative for congestion and sinus pressure.   Eyes: Negative for discharge and visual disturbance.  Respiratory: Negative for cough, chest tightness and shortness of breath.   Cardiovascular: Negative for chest pain, palpitations and leg swelling.  Gastrointestinal: Negative for nausea, vomiting, abdominal pain and diarrhea.  Genitourinary: Negative for dysuria and difficulty urinating.  Musculoskeletal: Negative for back pain and joint swelling.  Skin: Negative for color change and rash.  Neurological: Negative for dizziness (has resolved. ), light-headedness and headaches.  Hematological: Negative for adenopathy. Does not bruise/bleed easily.  Psychiatric/Behavioral: Negative for dysphoric mood and agitation.       Objective:    Physical Exam  Constitutional: She appears well-developed and well-nourished. No distress.  HENT:  Nose: Nose normal.  Mouth/Throat: Oropharynx is  clear and moist.  Eyes: Conjunctivae are normal. Right eye exhibits no discharge. Left eye exhibits no discharge.  Neck: Neck supple. No thyromegaly present.  Cardiovascular: Normal rate and regular rhythm.   Pulmonary/Chest: Breath sounds normal. No respiratory distress. She has no wheezes.  Abdominal: Soft. Bowel sounds are normal. There is no tenderness.  Musculoskeletal: She exhibits no edema or tenderness.  Lymphadenopathy:    She has no cervical adenopathy.  Skin: No rash noted. No erythema.  Psychiatric: She has a  normal mood and affect. Her behavior is normal.    BP 110/70 mmHg  Pulse 59  Temp(Src) 98.7 F (37.1 C) (Oral)  Ht 5' 10.5" (1.791 m)  Wt 157 lb 6 oz (71.385 kg)  BMI 22.25 kg/m2  SpO2 99% Wt Readings from Last 3 Encounters:  12/10/14 157 lb 6 oz (71.385 kg)  03/27/14 156 lb (70.761 kg)  10/01/13 152 lb 8 oz (69.174 kg)     Lab Results  Component Value Date   WBC 4.8 04/22/2014   HGB 12.7 04/22/2014   HCT 38.3 04/22/2014   PLT 231.0 04/22/2014   GLUCOSE 98 04/22/2014   CHOL 205* 04/22/2014   TRIG 56.0 04/22/2014   HDL 91.00 04/22/2014   LDLCALC 103* 04/22/2014   ALT 20 04/22/2014   AST 29 04/22/2014   NA 140 04/22/2014   K 4.5 04/22/2014   CL 103 04/22/2014   CREATININE 0.76 04/22/2014   BUN 11 04/22/2014   CO2 29 04/22/2014   TSH 3.20 04/22/2014       Assessment & Plan:   Problem List Items Addressed This Visit    Degenerative arthritis    Knee pain is stable.  Not limiting her exercise.  Follow.  She does not feel she needs any further intervention.        Dizziness    Appears to be c/w BPPV.  Resolved.  No reoccurrence.  Discussed with her at length today.  Follow.  Hold on further evaluation.        GERD (gastroesophageal reflux disease) - Primary    Symptoms controlled.  On omeprazole.  Follow.            Dale Selma, MD

## 2014-12-14 ENCOUNTER — Encounter: Payer: Self-pay | Admitting: Internal Medicine

## 2014-12-14 DIAGNOSIS — R42 Dizziness and giddiness: Secondary | ICD-10-CM | POA: Insufficient documentation

## 2014-12-14 NOTE — Assessment & Plan Note (Signed)
Knee pain is stable.  Not limiting her exercise.  Follow.  She does not feel she needs any further intervention.

## 2014-12-14 NOTE — Assessment & Plan Note (Signed)
Symptoms controlled.  On omeprazole.  Follow.

## 2014-12-14 NOTE — Assessment & Plan Note (Signed)
Appears to be c/w BPPV.  Resolved.  No reoccurrence.  Discussed with her at length today.  Follow.  Hold on further evaluation.

## 2015-03-30 ENCOUNTER — Other Ambulatory Visit (HOSPITAL_COMMUNITY)
Admission: RE | Admit: 2015-03-30 | Discharge: 2015-03-30 | Disposition: A | Discharge status: Home / Self Care | Payer: 59 | Source: Ambulatory Visit | Attending: Internal Medicine | Admitting: Internal Medicine

## 2015-03-30 ENCOUNTER — Ambulatory Visit (INDEPENDENT_AMBULATORY_CARE_PROVIDER_SITE_OTHER): Payer: 59 | Admitting: Internal Medicine

## 2015-03-30 ENCOUNTER — Encounter: Payer: Self-pay | Admitting: Internal Medicine

## 2015-03-30 VITALS — BP 128/80 | HR 64 | Temp 97.8°F | Resp 18 | Ht 70.5 in | Wt 162.4 lb

## 2015-03-30 DIAGNOSIS — K219 Gastro-esophageal reflux disease without esophagitis: Secondary | ICD-10-CM | POA: Diagnosis not present

## 2015-03-30 DIAGNOSIS — Z1239 Encounter for other screening for malignant neoplasm of breast: Secondary | ICD-10-CM

## 2015-03-30 DIAGNOSIS — Z01419 Encounter for gynecological examination (general) (routine) without abnormal findings: Secondary | ICD-10-CM | POA: Insufficient documentation

## 2015-03-30 DIAGNOSIS — N951 Menopausal and female climacteric states: Secondary | ICD-10-CM

## 2015-03-30 DIAGNOSIS — Z0000 Encounter for general adult medical examination without abnormal findings: Secondary | ICD-10-CM

## 2015-03-30 DIAGNOSIS — Z1151 Encounter for screening for human papillomavirus (HPV): Secondary | ICD-10-CM | POA: Diagnosis not present

## 2015-03-30 DIAGNOSIS — Z124 Encounter for screening for malignant neoplasm of cervix: Secondary | ICD-10-CM

## 2015-03-30 MED ORDER — CONJ ESTROG-MEDROXYPROGEST ACE 0.3-1.5 MG PO TABS: ORAL_TABLET | ORAL | Status: DC

## 2015-03-30 MED ORDER — OMEPRAZOLE 20 MG PO CPDR: 20.0000 mg | DELAYED_RELEASE_CAPSULE | Freq: Every day | ORAL | Status: DC

## 2015-03-30 NOTE — Assessment & Plan Note (Signed)
Physical today 03/30/15.  PAP today 03/30/15.  Scheduled for mammogram 05/17/15.  Colonoscopy 2012.  Recommended f/u colonoscopy in 10 years (per pt).

## 2015-03-30 NOTE — Assessment & Plan Note (Signed)
On prempro.  Taking qod.  Has tried to decrease to every third day.  Affected her sleeping.  Wants to continue current dose for now.  Will try to taper in the future.

## 2015-03-30 NOTE — Assessment & Plan Note (Signed)
Controlled on omeprazole.   

## 2015-03-30 NOTE — Progress Notes (Signed)
Patient ID: Sarah Perry, female   DOB: 08-21-1950, 64 y.o.   MRN: [id number]   Subjective:    Patient ID: Sarah Perry, female    DOB: 1950/05/30, 64 y.o.   MRN: [id number]  HPI  Patient with past history of GERD and menopausal syndrome who comes in today to follow up on these issues as well as for a complete physical exam.  States she is doing well.  Stays active.  No cardiac symptoms with increased activity or exertion.  No sob.  No acid reflux.  No abdominal pain or cramping.  Bowels stable.  Overall she feels she is doing well.  No discharge.    Past Medical History  Diagnosis Date  . Degenerative arthritis     hips, knees  . Varicella zoster 10/09  . GERD (gastroesophageal reflux disease)   . Basal cell carcinoma    Past Surgical History  Procedure Laterality Date  . Tonsilectomy/adenoidectomy with myringotomy    . Dilation and curettage of uterus    . Basal cell carcinoma excision     Family History  Problem Relation Age of Onset  . Ovarian cancer Mother   . Dementia Father   . Breast cancer Paternal Aunt   . Prostate cancer Father   . Hypercholesterolemia Mother   . Pancreatic cancer Maternal Aunt    Social History   Social History  . Marital Status: Married    Spouse Name: N/A  . Number of Children: 2  . Years of Education: N/A   Social History Main Topics  . Smoking status: Never Smoker   . Smokeless tobacco: Never Used  . Alcohol Use: 0.0 oz/week    0 Standard drinks or equivalent per week     Comment: glass of wine daily  . Drug Use: No  . Sexual Activity: Not Asked   Other Topics Concern  . None   Social History Narrative    Outpatient Encounter Prescriptions as of 03/30/2015  Medication Sig  . calcium-vitamin D (CALCIUM 500+D) 500-400 MG-UNIT per tablet Take 1 tablet by mouth daily.   Marland Kitchen estrogen, conjugated,-medroxyprogesterone (PREMPRO) 0.3-1.5 MG tablet TAKE ONE TABLET EVERY OTHER DAY  . glucosamine-chondroitin 500-400 MG tablet Take 2  tablets by mouth daily.   Marland Kitchen ibuprofen (ADVIL,MOTRIN) 200 MG tablet Take 200 mg by mouth daily.  . Multiple Vitamin (MULTIVITAMIN) tablet Take 1 tablet by mouth daily.  Marland Kitchen omeprazole (PRILOSEC) 20 MG capsule Take 1 capsule (20 mg total) by mouth daily.  . [DISCONTINUED] estrogen, conjugated,-medroxyprogesterone (PREMPRO) 0.3-1.5 MG per tablet TAKE ONE TABLET EVERY OTHER DAY  . [DISCONTINUED] omeprazole (PRILOSEC) 20 MG capsule TAKE 1 CAPSULE BY MOUTH DAILY.   No facility-administered encounter medications on file as of 03/30/2015.    Review of Systems  Constitutional: Negative for appetite change and unexpected weight change.  HENT: Negative for congestion and sinus pressure.   Eyes: Negative for pain and visual disturbance.  Respiratory: Negative for cough, chest tightness and shortness of breath.   Cardiovascular: Negative for chest pain, palpitations and leg swelling.  Gastrointestinal: Negative for nausea, vomiting, abdominal pain and diarrhea.  Genitourinary: Negative for dysuria and difficulty urinating.  Musculoskeletal: Negative for back pain and joint swelling.  Skin: Negative for color change and rash.  Neurological: Negative for dizziness and headaches.  Hematological: Negative for adenopathy. Does not bruise/bleed easily.  Psychiatric/Behavioral: Negative for dysphoric mood and agitation.       Objective:    Physical Exam  Constitutional: She is oriented to  person, place, and time. She appears well-developed and well-nourished. No distress.  HENT:  Nose: Nose normal.  Mouth/Throat: Oropharynx is clear and moist.  Eyes: Right eye exhibits no discharge. Left eye exhibits no discharge. No scleral icterus.  Neck: Neck supple. No thyromegaly present.  Cardiovascular: Normal rate and regular rhythm.   Pulmonary/Chest: Breath sounds normal. No accessory muscle usage. No tachypnea. No respiratory distress. She has no decreased breath sounds. She has no wheezes. She has no  rhonchi. Right breast exhibits no inverted nipple, no mass, no nipple discharge and no tenderness (no axillary adenopathy). Left breast exhibits no inverted nipple, no mass, no nipple discharge and no tenderness (no axilarry adenopathy).  Abdominal: Soft. Bowel sounds are normal. There is no tenderness.  Genitourinary:  Normal external genitalia.  Vaginal vault without lesions.  Cervix identified.  Pap smear performed.  Could not appreciate any adnexal masses or tenderness.    Musculoskeletal: She exhibits no edema or tenderness.  Lymphadenopathy:    She has no cervical adenopathy.  Neurological: She is alert and oriented to person, place, and time.  Skin: Skin is warm. No rash noted. No erythema.  Psychiatric: She has a normal mood and affect. Her behavior is normal.    BP 128/80 mmHg  Pulse 64  Temp(Src) 97.8 F (36.6 C) (Oral)  Resp 18  Ht 5' 10.5" (1.791 m)  Wt 162 lb 6 oz (73.653 kg)  BMI 22.96 kg/m2  SpO2 97% Wt Readings from Last 3 Encounters:  03/30/15 162 lb 6 oz (73.653 kg)  12/10/14 157 lb 6 oz (71.385 kg)  03/27/14 156 lb (70.761 kg)     Lab Results  Component Value Date   WBC 4.8 04/22/2014   HGB 12.7 04/22/2014   HCT 38.3 04/22/2014   PLT 231.0 04/22/2014   GLUCOSE 98 04/22/2014   CHOL 205* 04/22/2014   TRIG 56.0 04/22/2014   HDL 91.00 04/22/2014   LDLCALC 103* 04/22/2014   ALT 20 04/22/2014   AST 29 04/22/2014   NA 140 04/22/2014   K 4.5 04/22/2014   CL 103 04/22/2014   CREATININE 0.76 04/22/2014   BUN 11 04/22/2014   CO2 29 04/22/2014   TSH 3.20 04/22/2014       Assessment & Plan:   Problem List Items Addressed This Visit    GERD (gastroesophageal reflux disease)    Controlled on omeprazole.        Relevant Medications   omeprazole (PRILOSEC) 20 MG capsule   Health care maintenance    Physical today 03/30/15.  PAP today 03/30/15.  Scheduled for mammogram 05/17/15.  Colonoscopy 2012.  Recommended f/u colonoscopy in 10 years (per pt).         Menopausal syndrome    On prempro.  Taking qod.  Has tried to decrease to every third day.  Affected her sleeping.  Wants to continue current dose for now.  Will try to taper in the future.        Relevant Orders   CBC with Differential/Platelet   TSH   Comprehensive metabolic panel    Other Visit Diagnoses    Routine general medical examination at a health care facility    -  Primary    Relevant Orders    Lipid panel    Screening breast examination        Relevant Orders    MM DIGITAL SCREENING BILATERAL    Pap smear for cervical cancer screening        Relevant Orders  Cytology - PAP (Completed)        Dale Morrow, MD

## 2015-03-30 NOTE — Progress Notes (Signed)
Pre-visit discussion using our clinic review tool. No additional management support is needed unless otherwise documented below in the visit note.  

## 2015-03-30 NOTE — Patient Instructions (Signed)
nasacort nasal spray - 2 sprays each nostril one time per day.  Do this in the evening.    Saline nasal spray - flush nose at least 2-3x/day 

## 2015-04-01 ENCOUNTER — Encounter: Payer: Self-pay | Admitting: Internal Medicine

## 2015-04-01 LAB — CYTOLOGY - PAP

## 2015-04-05 ENCOUNTER — Encounter: Payer: Self-pay | Admitting: Internal Medicine

## 2015-04-28 ENCOUNTER — Other Ambulatory Visit (INDEPENDENT_AMBULATORY_CARE_PROVIDER_SITE_OTHER): Payer: 59

## 2015-04-28 DIAGNOSIS — N951 Menopausal and female climacteric states: Secondary | ICD-10-CM | POA: Diagnosis not present

## 2015-04-28 DIAGNOSIS — Z0000 Encounter for general adult medical examination without abnormal findings: Secondary | ICD-10-CM | POA: Diagnosis not present

## 2015-04-28 LAB — CBC WITH DIFFERENTIAL/PLATELET
BASOS ABS: 0.1 10*3/uL (ref 0.0–0.1)
BASOS PCT: 1.2 % (ref 0.0–3.0)
EOS ABS: 0.1 10*3/uL (ref 0.0–0.7)
Eosinophils Relative: 2 % (ref 0.0–5.0)
HCT: 37.3 % (ref 36.0–46.0)
Hemoglobin: 12.6 g/dL (ref 12.0–15.0)
LYMPHS ABS: 1.3 10*3/uL (ref 0.7–4.0)
LYMPHS PCT: 27.3 % (ref 12.0–46.0)
MCHC: 33.8 g/dL (ref 30.0–36.0)
MCV: 90.9 fl (ref 78.0–100.0)
MONO ABS: 0.5 10*3/uL (ref 0.1–1.0)
Monocytes Relative: 11.8 % (ref 3.0–12.0)
NEUTROS ABS: 2.7 10*3/uL (ref 1.4–7.7)
NEUTROS PCT: 57.7 % (ref 43.0–77.0)
PLATELETS: 228 10*3/uL (ref 150.0–400.0)
RBC: 4.11 Mil/uL (ref 3.87–5.11)
RDW: 12.6 % (ref 11.5–15.5)
WBC: 4.7 10*3/uL (ref 4.0–10.5)

## 2015-04-28 LAB — LIPID PANEL
CHOL/HDL RATIO: 2
CHOLESTEROL: 193 mg/dL (ref 0–200)
HDL: 88.4 mg/dL (ref >39.00)
LDL CALC: 93 mg/dL (ref 0–99)
NonHDL: 105
Triglycerides: 58 mg/dL (ref 0.0–149.0)
VLDL: 11.6 mg/dL (ref 0.0–40.0)

## 2015-04-28 LAB — COMPREHENSIVE METABOLIC PANEL
ALT: 21 U/L (ref 0–35)
AST: 30 U/L (ref 0–37)
Albumin: 4.3 g/dL (ref 3.5–5.2)
Alkaline Phosphatase: 81 U/L (ref 39–117)
BUN: 18 mg/dL (ref 6–23)
CHLORIDE: 103 meq/L (ref 96–112)
CO2: 30 mEq/L (ref 19–32)
CREATININE: 0.91 mg/dL (ref 0.40–1.20)
Calcium: 9.7 mg/dL (ref 8.4–10.5)
GFR: 66.5 mL/min (ref >60.00)
Glucose, Bld: 97 mg/dL (ref 70–99)
POTASSIUM: 4.6 meq/L (ref 3.5–5.1)
SODIUM: 141 meq/L (ref 135–145)
Total Bilirubin: 0.5 mg/dL (ref 0.2–1.2)
Total Protein: 6.9 g/dL (ref 6.0–8.3)

## 2015-04-28 LAB — TSH: TSH: 3.63 u[IU]/mL (ref 0.35–4.50)

## 2015-04-29 ENCOUNTER — Encounter: Payer: Self-pay | Admitting: Internal Medicine

## 2015-05-17 ENCOUNTER — Telehealth: Payer: 59 | Admitting: Family

## 2015-05-17 ENCOUNTER — Other Ambulatory Visit: Payer: Self-pay | Admitting: Internal Medicine

## 2015-05-17 ENCOUNTER — Ambulatory Visit
Admission: RE | Admit: 2015-05-17 | Discharge: 2015-05-17 | Disposition: A | Discharge status: Home / Self Care | Payer: 59 | Source: Ambulatory Visit | Attending: Internal Medicine | Admitting: Internal Medicine

## 2015-05-17 DIAGNOSIS — Z1239 Encounter for other screening for malignant neoplasm of breast: Secondary | ICD-10-CM

## 2015-05-17 DIAGNOSIS — B3731 Acute candidiasis of vulva and vagina: Secondary | ICD-10-CM

## 2015-05-17 DIAGNOSIS — B373 Candidiasis of vulva and vagina: Secondary | ICD-10-CM

## 2015-05-17 DIAGNOSIS — Z1231 Encounter for screening mammogram for malignant neoplasm of breast: Secondary | ICD-10-CM | POA: Diagnosis not present

## 2015-05-17 DIAGNOSIS — N3000 Acute cystitis without hematuria: Secondary | ICD-10-CM

## 2015-05-17 MED ORDER — SULFAMETHOXAZOLE-TRIMETHOPRIM 800-160 MG PO TABS: 1.0000 | ORAL_TABLET | Freq: Two times a day (BID) | ORAL | Status: DC

## 2015-05-17 MED ORDER — FLUCONAZOLE 150 MG PO TABS: 150.0000 mg | ORAL_TABLET | Freq: Once | ORAL | Status: DC

## 2015-05-17 NOTE — Progress Notes (Signed)
We are sorry that you are not feeling well.  Here is how we plan to help!  Based on what you shared with me it looks like you most likely have a simple urinary tract infection.  A UTI (Urinary Tract Infection) is a bacterial infection of the bladder.  Most cases of urinary tract infections are simple to treat but a key part of your care is to encourage you to drink plenty of fluids and watch your symptoms carefully.  I have prescribed Bactrim, a sulfa antibiotic, twice a day for 5 days.  Your symptoms should gradually improve. Call us if the burning in your urine worsens, you develop worsening fever, back pain or pelvic pain or if your symptoms do not resolve after completing the antibiotic. A single Diflucan (fluconazole) 150mg  tablet once.  I have electronically sent this prescription into the pharmacy that you have chosen.  Urinary tract infections can be prevented by drinking plenty of water to keep your body hydrated.  Also be sure when you wipe, wipe from front to back and don't hold it in!  If possible, empty your bladder every 4 hours.  Your e-visit answers were reviewed by a board certified advanced clinical practitioner to complete your personal care plan.  Depending on the condition, your plan could have included both over the counter or prescription medications.  If there is a problem please reply  once you have received a response from your provider.  Your safety is important to [place].  If you have drug allergies check your prescription carefully.    You can use MyChart to ask questions about today's visit, request a non-urgent call back, or ask for a work or school excuse for 24 hours related to this e-Visit. If it has been greater than 24 hours you will need to follow up with your provider, or enter a new e-Visit to address those concerns.   You will get an e-mail in the next two days asking about your experience.  I hope that your e-visit has been valuable and will speed your  recovery. Thank you for using e-visits.

## 2015-06-08 DIAGNOSIS — L718 Other rosacea: Secondary | ICD-10-CM | POA: Diagnosis not present

## 2015-06-08 DIAGNOSIS — L853 Xerosis cutis: Secondary | ICD-10-CM | POA: Diagnosis not present

## 2015-06-08 DIAGNOSIS — L814 Other melanin hyperpigmentation: Secondary | ICD-10-CM | POA: Diagnosis not present

## 2015-06-08 DIAGNOSIS — Z85828 Personal history of other malignant neoplasm of skin: Secondary | ICD-10-CM | POA: Diagnosis not present

## 2015-06-08 DIAGNOSIS — L905 Scar conditions and fibrosis of skin: Secondary | ICD-10-CM | POA: Diagnosis not present

## 2015-08-04 DIAGNOSIS — H40053 Ocular hypertension, bilateral: Secondary | ICD-10-CM | POA: Diagnosis not present

## 2015-10-05 ENCOUNTER — Encounter: Payer: Self-pay | Admitting: Internal Medicine

## 2015-10-06 MED ORDER — CONJ ESTROG-MEDROXYPROGEST ACE 0.3-1.5 MG PO TABS: ORAL_TABLET | ORAL | Status: DC

## 2015-10-06 NOTE — Telephone Encounter (Signed)
rx sent in for prempro #30 with 5 refills.

## 2015-10-18 ENCOUNTER — Ambulatory Visit: Payer: 59 | Admitting: Internal Medicine

## 2015-11-26 DIAGNOSIS — H40053 Ocular hypertension, bilateral: Secondary | ICD-10-CM | POA: Diagnosis not present

## 2015-12-07 DIAGNOSIS — Z85828 Personal history of other malignant neoplasm of skin: Secondary | ICD-10-CM | POA: Diagnosis not present

## 2015-12-07 DIAGNOSIS — L820 Inflamed seborrheic keratosis: Secondary | ICD-10-CM | POA: Diagnosis not present

## 2015-12-07 DIAGNOSIS — L578 Other skin changes due to chronic exposure to nonionizing radiation: Secondary | ICD-10-CM | POA: Diagnosis not present

## 2015-12-07 DIAGNOSIS — H40053 Ocular hypertension, bilateral: Secondary | ICD-10-CM | POA: Diagnosis not present

## 2015-12-07 DIAGNOSIS — D692 Other nonthrombocytopenic purpura: Secondary | ICD-10-CM | POA: Diagnosis not present

## 2015-12-07 DIAGNOSIS — Z1283 Encounter for screening for malignant neoplasm of skin: Secondary | ICD-10-CM | POA: Diagnosis not present

## 2015-12-07 DIAGNOSIS — L821 Other seborrheic keratosis: Secondary | ICD-10-CM | POA: Diagnosis not present

## 2015-12-07 DIAGNOSIS — L570 Actinic keratosis: Secondary | ICD-10-CM | POA: Diagnosis not present

## 2015-12-07 DIAGNOSIS — D1800 Hemangioma unspecified site: Secondary | ICD-10-CM | POA: Diagnosis not present

## 2015-12-07 DIAGNOSIS — L718 Other rosacea: Secondary | ICD-10-CM | POA: Diagnosis not present

## 2016-03-23 ENCOUNTER — Encounter: Payer: Self-pay | Admitting: Internal Medicine

## 2016-03-30 ENCOUNTER — Encounter: Payer: Self-pay | Admitting: Internal Medicine

## 2016-03-30 ENCOUNTER — Ambulatory Visit (INDEPENDENT_AMBULATORY_CARE_PROVIDER_SITE_OTHER): Payer: 59 | Admitting: Internal Medicine

## 2016-03-30 VITALS — BP 136/78 | HR 66 | Temp 98.3°F | Ht 71.0 in | Wt 159.2 lb

## 2016-03-30 DIAGNOSIS — Z13220 Encounter for screening for lipoid disorders: Secondary | ICD-10-CM | POA: Diagnosis not present

## 2016-03-30 DIAGNOSIS — Z0000 Encounter for general adult medical examination without abnormal findings: Secondary | ICD-10-CM | POA: Diagnosis not present

## 2016-03-30 DIAGNOSIS — K219 Gastro-esophageal reflux disease without esophagitis: Secondary | ICD-10-CM | POA: Diagnosis not present

## 2016-03-30 DIAGNOSIS — N951 Menopausal and female climacteric states: Secondary | ICD-10-CM

## 2016-03-30 LAB — CBC WITH DIFFERENTIAL/PLATELET
Basophils Absolute: 0 K/uL (ref 0.0–0.1)
Basophils Relative: 0.6 % (ref 0.0–3.0)
Eosinophils Absolute: 0.1 K/uL (ref 0.0–0.7)
Eosinophils Relative: 2.5 % (ref 0.0–5.0)
HCT: 36.8 % (ref 36.0–46.0)
Hemoglobin: 12.8 g/dL (ref 12.0–15.0)
Lymphocytes Relative: 29.8 % (ref 12.0–46.0)
Lymphs Abs: 1.4 K/uL (ref 0.7–4.0)
MCHC: 34.7 g/dL (ref 30.0–36.0)
MCV: 90.7 fl (ref 78.0–100.0)
Monocytes Absolute: 0.4 K/uL (ref 0.1–1.0)
Monocytes Relative: 9.3 % (ref 3.0–12.0)
Neutro Abs: 2.7 K/uL (ref 1.4–7.7)
Neutrophils Relative %: 57.8 % (ref 43.0–77.0)
Platelets: 232 K/uL (ref 150.0–400.0)
RBC: 4.05 Mil/uL (ref 3.87–5.11)
RDW: 12.9 % (ref 11.5–15.5)
WBC: 4.7 K/uL (ref 4.0–10.5)

## 2016-03-30 LAB — COMPREHENSIVE METABOLIC PANEL WITH GFR
ALT: 19 U/L (ref 0–35)
AST: 26 U/L (ref 0–37)
Albumin: 4.6 g/dL (ref 3.5–5.2)
Alkaline Phosphatase: 75 U/L (ref 39–117)
BUN: 17 mg/dL (ref 6–23)
CO2: 34 meq/L — ABNORMAL HIGH (ref 19–32)
Calcium: 9.8 mg/dL (ref 8.4–10.5)
Chloride: 102 meq/L (ref 96–112)
Creatinine, Ser: 0.8 mg/dL (ref 0.40–1.20)
GFR: 76.93 mL/min
Glucose, Bld: 109 mg/dL — ABNORMAL HIGH (ref 70–99)
Potassium: 4.7 meq/L (ref 3.5–5.1)
Sodium: 142 meq/L (ref 135–145)
Total Bilirubin: 0.6 mg/dL (ref 0.2–1.2)
Total Protein: 7.1 g/dL (ref 6.0–8.3)

## 2016-03-30 LAB — LIPID PANEL
Cholesterol: 208 mg/dL — ABNORMAL HIGH (ref 0–200)
HDL: 87.9 mg/dL
LDL Cholesterol: 103 mg/dL — ABNORMAL HIGH (ref 0–99)
NonHDL: 120.34
Total CHOL/HDL Ratio: 2
Triglycerides: 85 mg/dL (ref 0.0–149.0)
VLDL: 17 mg/dL (ref 0.0–40.0)

## 2016-03-30 LAB — TSH: TSH: 3.1 u[IU]/mL (ref 0.35–4.50)

## 2016-03-30 MED ORDER — OMEPRAZOLE 20 MG PO CPDR: 20.0000 mg | DELAYED_RELEASE_CAPSULE | Freq: Every day | ORAL | 3 refills | Status: DC

## 2016-03-30 NOTE — Assessment & Plan Note (Addendum)
Physical 03/30/16.  PAP 03/30/15 negative with negative HPV.  Mammogram 05/17/15 - Birads I.  Her insurance is changing in January.  Will call us when wants to schedule.  Colonoscopy 2012.  Per pt, f/u colonoscopy due 05/2020.

## 2016-03-30 NOTE — Assessment & Plan Note (Signed)
Controlled on omeprazole.   

## 2016-03-30 NOTE — Progress Notes (Signed)
Patient ID: Sarah Perry, female   DOB: 21-May-1950, 65 y.o.   MRN: [id number]

## 2016-03-30 NOTE — Progress Notes (Signed)
Patient ID: Sarah Perry, female   DOB: [birth year], 65 y.o.   MRN: [id number]   Subjective:    Patient ID: Sarah Perry, female    DOB: 1950-07-24, 65 y.o.   MRN: [id number]  HPI   Patient with past history of GERD who comes in today to follow up on this as well as her physical exam.  On omeprazole.  This controls reflux symptoms.  No chest pain.  No sob.  No acid reflux.  No abdominal pain or cramping.  Bowels stable.  Feels good.  Stays active.     Past Medical History:  Diagnosis Date  . Basal cell carcinoma   . Degenerative arthritis    hips, knees  . GERD (gastroesophageal reflux disease)   . Varicella zoster 10/09   Past Surgical History:  Procedure Laterality Date  . BASAL CELL CARCINOMA EXCISION    . DILATION AND CURETTAGE OF UTERUS    . TONSILECTOMY/ADENOIDECTOMY WITH MYRINGOTOMY     Family History  Problem Relation Age of Onset  . Ovarian cancer Mother   . Hypercholesterolemia Mother   . Dementia Father   . Prostate cancer Father   . Breast cancer Paternal Aunt   . Pancreatic cancer Maternal Aunt    Social History   Social History  . Marital status: Married    Spouse name: N/A  . Number of children: 2  . Years of education: N/A   Social History Main Topics  . Smoking status: Never Smoker  . Smokeless tobacco: Never Used  . Alcohol use 0.0 oz/week     Comment: glass of wine daily  . Drug use: No  . Sexual activity: Not Asked   Other Topics Concern  . None   Social History Narrative  . None    Outpatient Encounter Prescriptions as of 03/30/2016  Medication Sig  . calcium-vitamin D (CALCIUM 500+D) 500-400 MG-UNIT per tablet Take 1 tablet by mouth daily.   Marland Kitchen estrogen, conjugated,-medroxyprogesterone (PREMPRO) 0.3-1.5 MG tablet TAKE ONE TABLET Every day  . glucosamine-chondroitin 500-400 MG tablet Take 2 tablets by mouth daily.   Marland Kitchen ibuprofen (ADVIL,MOTRIN) 200 MG tablet Take 200 mg by mouth daily.  . Multiple Vitamin (MULTIVITAMIN) tablet Take 1  tablet by mouth daily.  Marland Kitchen omeprazole (PRILOSEC) 20 MG capsule Take 1 capsule (20 mg total) by mouth daily.  . [DISCONTINUED] fluconazole (DIFLUCAN) 150 MG tablet Take 1 tablet (150 mg total) by mouth once.  . [DISCONTINUED] omeprazole (PRILOSEC) 20 MG capsule Take 1 capsule (20 mg total) by mouth daily.  . [DISCONTINUED] sulfamethoxazole-trimethoprim (BACTRIM DS) 800-160 MG tablet Take 1 tablet by mouth 2 (two) times daily.   No facility-administered encounter medications on file as of 03/30/2016.     Review of Systems  Constitutional: Negative for appetite change and unexpected weight change.  HENT: Negative for congestion and sinus pressure.   Eyes: Negative for pain and visual disturbance.  Respiratory: Negative for cough, chest tightness and shortness of breath.   Cardiovascular: Negative for chest pain, palpitations and leg swelling.  Gastrointestinal: Negative for abdominal pain, diarrhea, nausea and vomiting.  Genitourinary: Negative for difficulty urinating and dysuria.  Musculoskeletal: Negative for back pain and joint swelling.  Skin: Negative for color change and rash.  Neurological: Negative for dizziness, light-headedness and headaches.  Hematological: Negative for adenopathy. Does not bruise/bleed easily.  Psychiatric/Behavioral: Negative for agitation and dysphoric mood.       Objective:     Blood pressure rechecked by me:  122/78  Physical Exam  Constitutional: She is oriented to person, place, and time. She appears well-developed and well-nourished. No distress.  HENT:  Nose: Nose normal.  Mouth/Throat: Oropharynx is clear and moist.  Eyes: Right eye exhibits no discharge. Left eye exhibits no discharge. No scleral icterus.  Neck: Neck supple. No thyromegaly present.  Cardiovascular: Normal rate and regular rhythm.   Pulmonary/Chest: Breath sounds normal. No accessory muscle usage. No tachypnea. No respiratory distress. She has no decreased breath sounds. She has  no wheezes. She has no rhonchi. Right breast exhibits no inverted nipple, no mass, no nipple discharge and no tenderness (no axillary adenopathy). Left breast exhibits no inverted nipple, no mass, no nipple discharge and no tenderness (no axilarry adenopathy).  Abdominal: Soft. Bowel sounds are normal. There is no tenderness.  Musculoskeletal: She exhibits no edema or tenderness.  Lymphadenopathy:    She has no cervical adenopathy.  Neurological: She is alert and oriented to person, place, and time.  Skin: Skin is warm. No rash noted. No erythema.  Psychiatric: She has a normal mood and affect. Her behavior is normal.    BP 136/78   Pulse 66   Temp 98.3 F (36.8 C) (Oral)   Ht 5\' 11"  (1.803 m)   Wt 159 lb 3.2 oz (72.2 kg)   SpO2 98%   BMI 22.20 kg/m  Wt Readings from Last 3 Encounters:  03/30/16 159 lb 3.2 oz (72.2 kg)  03/30/15 162 lb 6 oz (73.7 kg)  12/10/14 157 lb 6 oz (71.4 kg)     Lab Results  Component Value Date   WBC 4.7 03/30/2016   HGB 12.8 03/30/2016   HCT 36.8 03/30/2016   PLT 232.0 03/30/2016   GLUCOSE 109 (H) 03/30/2016   CHOL 208 (H) 03/30/2016   TRIG 85.0 03/30/2016   HDL 87.90 03/30/2016   LDLCALC 103 (H) 03/30/2016   ALT 19 03/30/2016   AST 26 03/30/2016   NA 142 03/30/2016   K 4.7 03/30/2016   CL 102 03/30/2016   CREATININE 0.80 03/30/2016   BUN 17 03/30/2016   CO2 34 (H) 03/30/2016   TSH 3.10 03/30/2016    Mm Screening Breast Tomo Bilateral  Result Date: 05/17/2015 CLINICAL DATA:  Screening. EXAM: DIGITAL SCREENING BILATERAL MAMMOGRAM WITH 3D TOMO WITH CAD COMPARISON:  Previous exam(s). ACR Breast Density Category c: The breast tissue is heterogeneously dense, which may obscure small masses. FINDINGS: There are no findings suspicious for malignancy. Images were processed with CAD. IMPRESSION: No mammographic evidence of malignancy. A result letter of this screening mammogram will be mailed directly to the patient. RECOMMENDATION: Screening  mammogram in one year. (Code:SM-B-01Y) BI-RADS CATEGORY  1: Negative. Electronically Signed   By: Ted Mcalpine M.D.   On: 05/17/2015 10:23       Assessment & Plan:   Problem List Items Addressed This Visit    GERD (gastroesophageal reflux disease)    Controlled on omeprazole.        Relevant Medications   omeprazole (PRILOSEC) 20 MG capsule   Other Relevant Orders   CBC with Differential/Platelet (Completed)   Comprehensive metabolic panel (Completed)   TSH (Completed)   Health care maintenance    Physical 03/30/16.  PAP 03/30/15 negative with negative HPV.  Mammogram 05/17/15 - [facility] I.  Her insurance is changing in January.  Will call us when wants to schedule.  Colonoscopy 2012.  Per pt, f/u colonoscopy due 05/2020.        Menopausal syndrome    On  prempro.  Wants to remain on the medication.  Follow.        Other Visit Diagnoses    Screening cholesterol level    -  Primary   Relevant Orders   Lipid panel (Completed)       Dale Hanover, MD

## 2016-03-30 NOTE — Assessment & Plan Note (Signed)
On prempro.  Wants to remain on the medication.  Follow.

## 2016-03-30 NOTE — Progress Notes (Signed)
Pre visit review using our clinic review tool, if applicable. No additional management support is needed unless otherwise documented below in the visit note. 

## 2016-03-31 ENCOUNTER — Other Ambulatory Visit: Payer: Self-pay | Admitting: Internal Medicine

## 2016-03-31 DIAGNOSIS — R739 Hyperglycemia, unspecified: Secondary | ICD-10-CM

## 2016-03-31 NOTE — Progress Notes (Signed)
Order placed for f/u fasting glucose and a1c labs.

## 2016-04-04 ENCOUNTER — Telehealth: Payer: Self-pay | Admitting: Internal Medicine

## 2016-04-04 NOTE — Telephone Encounter (Signed)
see result note

## 2016-04-04 NOTE — Telephone Encounter (Signed)
Pt called returning your call in regards to labs. Thank you!  Call pt @ [phone]

## 2016-04-05 ENCOUNTER — Encounter: Payer: Self-pay | Admitting: Internal Medicine

## 2016-04-25 ENCOUNTER — Encounter: Payer: Self-pay | Admitting: Internal Medicine

## 2016-04-25 ENCOUNTER — Other Ambulatory Visit: Payer: Self-pay | Admitting: Surgical

## 2016-04-25 DIAGNOSIS — Z1231 Encounter for screening mammogram for malignant neoplasm of breast: Secondary | ICD-10-CM

## 2016-04-25 DIAGNOSIS — Z1159 Encounter for screening for other viral diseases: Secondary | ICD-10-CM

## 2016-04-25 NOTE — Telephone Encounter (Signed)
Order placed for hepatitis C testing.

## 2016-05-02 ENCOUNTER — Other Ambulatory Visit (INDEPENDENT_AMBULATORY_CARE_PROVIDER_SITE_OTHER): Payer: BLUE CROSS/BLUE SHIELD

## 2016-05-02 DIAGNOSIS — R739 Hyperglycemia, unspecified: Secondary | ICD-10-CM | POA: Diagnosis not present

## 2016-05-02 DIAGNOSIS — Z1159 Encounter for screening for other viral diseases: Secondary | ICD-10-CM

## 2016-05-02 LAB — HEMOGLOBIN A1C: Hgb A1c MFr Bld: 5.7 % (ref 4.6–6.5)

## 2016-05-03 LAB — HEPATITIS C ANTIBODY: HCV Ab: NEGATIVE

## 2016-05-03 NOTE — Addendum Note (Signed)
Addended by: Leeanne Rio on: 05/03/2016 11:07 AM   Modules accepted: Orders

## 2016-05-04 ENCOUNTER — Encounter: Payer: Self-pay | Admitting: Internal Medicine

## 2016-05-06 LAB — GLUCOSE, FASTING: GLUCOSE, PLASMA: 87 mg/dL (ref 65–99)

## 2016-05-17 ENCOUNTER — Encounter: Payer: Self-pay | Admitting: Internal Medicine

## 2016-05-17 NOTE — Telephone Encounter (Signed)
If persistent increased cough and fever, she does need to be seen.  Need to confirm etiology and then best treatment.

## 2016-05-17 NOTE — Telephone Encounter (Signed)
Spoke to pt she was transferred to reception to make app.

## 2016-05-18 ENCOUNTER — Ambulatory Visit (INDEPENDENT_AMBULATORY_CARE_PROVIDER_SITE_OTHER): Payer: BLUE CROSS/BLUE SHIELD | Admitting: Family Medicine

## 2016-05-18 ENCOUNTER — Encounter: Payer: Self-pay | Admitting: Family Medicine

## 2016-05-18 ENCOUNTER — Ambulatory Visit (INDEPENDENT_AMBULATORY_CARE_PROVIDER_SITE_OTHER): Payer: BLUE CROSS/BLUE SHIELD

## 2016-05-18 VITALS — BP 109/71 | HR 73 | Temp 98.6°F | Wt 160.6 lb

## 2016-05-18 DIAGNOSIS — R059 Cough, unspecified: Secondary | ICD-10-CM

## 2016-05-18 DIAGNOSIS — R05 Cough: Secondary | ICD-10-CM | POA: Diagnosis not present

## 2016-05-18 DIAGNOSIS — U071 COVID-19: Secondary | ICD-10-CM | POA: Insufficient documentation

## 2016-05-18 MED ORDER — HYDROCOD POLST-CPM POLST ER 10-8 MG/5ML PO SUER: 5.0000 mL | Freq: Two times a day (BID) | ORAL | 0 refills | Status: DC | PRN

## 2016-05-18 NOTE — Progress Notes (Signed)
Pre visit review using our clinic review tool, if applicable. No additional management support is needed unless otherwise documented below in the visit note. 

## 2016-05-18 NOTE — Progress Notes (Signed)
Subjective:  Patient ID: Sarah Perry, female    DOB: 09-29-1950  Age: 66 y.o. MRN: [id number]  CC: Cough, fever  HPI:  66 year old female presents with the above complaints.  Patient suffered a syncopal episode Thursday night. She was seen in the ER. Found to be orthostatic. Was given IV fluids with improvement. She had a negative workup. She was discharged home in stable condition.  Patient states that on Friday she developed a low-grade fever and associated cough. Her fever has not been over 100. She's been taking Tylenol, Mucinex, DayQuil, and some expired cough syrup for her symptoms. Cough is her predominant symptom is he keeps her up at night. She's also had some associated sore throat. No associated shortness of breath. No chest pain. No other complaints or concerns at this time.  Social Hx   Social History   Social History  . Marital status: Married    Spouse name: N/A  . Number of children: 2  . Years of education: N/A   Social History Main Topics  . Smoking status: Never Smoker  . Smokeless tobacco: Never Used  . Alcohol use 0.0 oz/week     Comment: glass of wine daily  . Drug use: No  . Sexual activity: Not Asked   Other Topics Concern  . None   Social History Narrative  . None    Review of Systems  Constitutional: Positive for fever.  HENT: Positive for sore throat.   Respiratory: Positive for cough.   Neurological: Positive for syncope.   Objective:  BP 109/71   Pulse 73   Temp 98.6 F (37 C) (Oral)   Wt 160 lb 9.6 oz (72.8 kg)   SpO2 99%   BMI 22.40 kg/m   BP/Weight 05/18/2016 03/30/2016 03/30/2015  Systolic BP 109 136 128  Diastolic BP 71 78 80  Wt. (Lbs) 160.6 159.2 162.38  BMI 22.4 22.2 22.96   Physical Exam  Constitutional: She is oriented to person, place, and time.  Well-appearing female in no acute distress.  HENT:  Mouth/Throat: Oropharynx is clear and moist.  Cardiovascular: Normal rate and regular rhythm.   Pulmonary/Chest:  Effort normal and breath sounds normal.  Neurological: She is alert and oriented to person, place, and time.  Psychiatric: She has a normal mood and affect.  Vitals reviewed.   Lab Results  Component Value Date   WBC 4.7 03/30/2016   HGB 12.8 03/30/2016   HCT 36.8 03/30/2016   PLT 232.0 03/30/2016   GLUCOSE 109 (H) 03/30/2016   CHOL 208 (H) 03/30/2016   TRIG 85.0 03/30/2016   HDL 87.90 03/30/2016   LDLCALC 103 (H) 03/30/2016   ALT 19 03/30/2016   AST 26 03/30/2016   NA 142 03/30/2016   K 4.7 03/30/2016   CL 102 03/30/2016   CREATININE 0.80 03/30/2016   BUN 17 03/30/2016   CO2 34 (H) 03/30/2016   TSH 3.10 03/30/2016   HGBA1C 5.7 05/02/2016    Assessment & Plan:   Problem List Items Addressed This Visit    Cough - Primary    New problem. Recent ER visit reviewed and summarized in history of present illness.  Given associated elevated temperature as well as recent syncopal episode, obtaining further workup withchest x-ray. Treating with Tussionex.       Relevant Orders   DG Chest 2 View     Meds ordered this encounter  Medications  . chlorpheniramine-HYDROcodone (TUSSIONEX PENNKINETIC ER) 10-8 MG/5ML SUER    Sig: Take 5  mLs by mouth every 12 (twelve) hours as needed.    Dispense:  115 mL    Refill:  0   Follow-up: PRN  Everlene Other DO [facility]

## 2016-05-18 NOTE — Assessment & Plan Note (Signed)
New problem. Recent ER visit reviewed and summarized in history of present illness.  Given associated elevated temperature as well as recent syncopal episode, obtaining further workup withchest x-ray. Treating with Tussionex.

## 2016-05-18 NOTE — Patient Instructions (Signed)
This is likely viral.  Use the cough syrup as needed, particularly at night.  We will call with your chest x-ray results.  Feel better  Dr. Lacinda Axon

## 2016-05-30 ENCOUNTER — Other Ambulatory Visit: Payer: Self-pay | Admitting: Internal Medicine

## 2016-05-30 ENCOUNTER — Ambulatory Visit
Admission: RE | Admit: 2016-05-30 | Discharge: 2016-05-30 | Disposition: A | Discharge status: Home / Self Care | Payer: BLUE CROSS/BLUE SHIELD | Source: Ambulatory Visit | Attending: Internal Medicine | Admitting: Internal Medicine

## 2016-05-30 DIAGNOSIS — Z1231 Encounter for screening mammogram for malignant neoplasm of breast: Secondary | ICD-10-CM | POA: Diagnosis not present

## 2016-09-15 ENCOUNTER — Encounter: Payer: Self-pay | Admitting: Internal Medicine

## 2016-09-15 NOTE — Telephone Encounter (Signed)
If having pain in ear, I would recommend evaluating pt prior to applying anything in the ear.  Needs evaluation today if 6/10 pain.

## 2016-09-18 ENCOUNTER — Ambulatory Visit: Payer: BLUE CROSS/BLUE SHIELD | Admitting: Family Medicine

## 2016-09-29 ENCOUNTER — Ambulatory Visit: Payer: 59 | Admitting: Internal Medicine

## 2016-10-03 ENCOUNTER — Ambulatory Visit (INDEPENDENT_AMBULATORY_CARE_PROVIDER_SITE_OTHER): Payer: BLUE CROSS/BLUE SHIELD | Admitting: Internal Medicine

## 2016-10-03 ENCOUNTER — Encounter: Payer: Self-pay | Admitting: Internal Medicine

## 2016-10-03 VITALS — BP 110/68 | HR 67 | Temp 98.5°F | Resp 12 | Ht 71.0 in | Wt 160.4 lb

## 2016-10-03 DIAGNOSIS — K219 Gastro-esophageal reflux disease without esophagitis: Secondary | ICD-10-CM | POA: Diagnosis not present

## 2016-10-03 DIAGNOSIS — N951 Menopausal and female climacteric states: Secondary | ICD-10-CM | POA: Diagnosis not present

## 2016-10-03 DIAGNOSIS — Z87898 Personal history of other specified conditions: Secondary | ICD-10-CM

## 2016-10-03 DIAGNOSIS — D649 Anemia, unspecified: Secondary | ICD-10-CM

## 2016-10-03 LAB — CBC WITH DIFFERENTIAL/PLATELET
BASOS ABS: 0.1 10*3/uL (ref 0.0–0.1)
BASOS PCT: 1 % (ref 0.0–3.0)
EOS ABS: 0.1 10*3/uL (ref 0.0–0.7)
Eosinophils Relative: 1.5 % (ref 0.0–5.0)
HEMATOCRIT: 37.9 % (ref 36.0–46.0)
Hemoglobin: 12.8 g/dL (ref 12.0–15.0)
LYMPHS PCT: 22 % (ref 12.0–46.0)
Lymphs Abs: 1.1 10*3/uL (ref 0.7–4.0)
MCHC: 33.9 g/dL (ref 30.0–36.0)
MCV: 90.8 fl (ref 78.0–100.0)
MONO ABS: 0.4 10*3/uL (ref 0.1–1.0)
Monocytes Relative: 8.1 % (ref 3.0–12.0)
NEUTROS ABS: 3.5 10*3/uL (ref 1.4–7.7)
Neutrophils Relative %: 67.4 % (ref 43.0–77.0)
PLATELETS: 229 10*3/uL (ref 150.0–400.0)
RBC: 4.17 Mil/uL (ref 3.87–5.11)
RDW: 13.4 % (ref 11.5–15.5)
WBC: 5.2 10*3/uL (ref 4.0–10.5)

## 2016-10-03 MED ORDER — CONJ ESTROG-MEDROXYPROGEST ACE 0.3-1.5 MG PO TABS: ORAL_TABLET | ORAL | 5 refills | Status: DC

## 2016-10-03 NOTE — Progress Notes (Signed)
Pre-visit discussion using our clinic review tool. No additional management support is needed unless otherwise documented below in the visit note.  

## 2016-10-03 NOTE — Progress Notes (Signed)
Patient ID: Sarah Perry, female   DOB: 11-28-1950, 66 y.o.   MRN: [id number]   Subjective:    Patient ID: Sarah Perry, female    DOB: Feb 01, 1951, 66 y.o.   MRN: [id number]  HPI  Patient here for scheduled follow up.  Back in 05/2016, she was seen in the ER after syncopal episode.  She had not slept much prior to the episode.  Was keeping her grandchild.  Felt feverish.  Went to a musical.  Did not eat prior.  Sat down and when got up to go into the show, passed out.  Lowered to ground.  No chest pain.  No sob.  EMS called.  To ER.  Initial blood pressure documented - 89/60.  Was given fluid bolus.  ER note reviewed.  Discharged home.  Was reevaluated her by Dr Adriana Simas on 05/18/16 with fever and cough.  cxr unrevealing.  Treated cough with tussionex.  Has had no reoccurring episodes.  Stays active.  No cough.  No sob.  No acid reflux.  No abdominal pain.  Bowels moving.  No urine change.  Feels good.     Past Medical History:  Diagnosis Date  . Basal cell carcinoma   . Degenerative arthritis    hips, knees  . GERD (gastroesophageal reflux disease)   . Varicella zoster 10/09   Past Surgical History:  Procedure Laterality Date  . BASAL CELL CARCINOMA EXCISION    . DILATION AND CURETTAGE OF UTERUS    . TONSILECTOMY/ADENOIDECTOMY WITH MYRINGOTOMY     Family History  Problem Relation Age of Onset  . Ovarian cancer Mother   . Hypercholesterolemia Mother   . Dementia Father   . Prostate cancer Father   . Breast cancer Paternal Aunt   . Pancreatic cancer Maternal Aunt    Social History   Social History  . Marital status: Married    Spouse name: N/A  . Number of children: 2  . Years of education: N/A   Social History Main Topics  . Smoking status: Never Smoker  . Smokeless tobacco: Never Used  . Alcohol use 0.0 oz/week     Comment: glass of wine daily  . Drug use: No  . Sexual activity: Not Asked   Other Topics Concern  . None   Social History Narrative  . None    Outpatient  Encounter Prescriptions as of 10/03/2016  Medication Sig  . calcium-vitamin D (CALCIUM 500+D) 500-400 MG-UNIT per tablet Take 1 tablet by mouth daily.   Marland Kitchen estrogen, conjugated,-medroxyprogesterone (PREMPRO) 0.3-1.5 MG tablet TAKE ONE TABLET Every day  . glucosamine-chondroitin 500-400 MG tablet Take 2 tablets by mouth daily.   Marland Kitchen ibuprofen (ADVIL,MOTRIN) 200 MG tablet Take 200 mg by mouth daily.  . Multiple Vitamin (MULTIVITAMIN) tablet Take 1 tablet by mouth daily.  Marland Kitchen omeprazole (PRILOSEC) 20 MG capsule Take 1 capsule (20 mg total) by mouth daily.  . [DISCONTINUED] estrogen, conjugated,-medroxyprogesterone (PREMPRO) 0.3-1.5 MG tablet TAKE ONE TABLET Every day  . latanoprost (XALATAN) 0.005 % ophthalmic solution   . [DISCONTINUED] chlorpheniramine-HYDROcodone (TUSSIONEX PENNKINETIC ER) 10-8 MG/5ML SUER Take 5 mLs by mouth every 12 (twelve) hours as needed.   No facility-administered encounter medications on file as of 10/03/2016.     Review of Systems  Constitutional: Negative for appetite change and unexpected weight change.  HENT: Negative for congestion and sinus pressure.   Respiratory: Negative for cough, chest tightness and shortness of breath.   Cardiovascular: Negative for chest pain, palpitations and leg swelling.  Gastrointestinal: Negative for abdominal pain, diarrhea, nausea and vomiting.  Genitourinary: Negative for difficulty urinating and dysuria.  Musculoskeletal: Negative for back pain and joint swelling.  Skin: Negative for color change and rash.  Neurological: Negative for dizziness, light-headedness and headaches.  Psychiatric/Behavioral: Negative for agitation and dysphoric mood.       Objective:    Physical Exam  Constitutional: She appears well-developed and well-nourished. No distress.  HENT:  Nose: Nose normal.  Mouth/Throat: Oropharynx is clear and moist.  Neck: Neck supple. No thyromegaly present.  Cardiovascular: Normal rate and regular rhythm.     Pulmonary/Chest: Breath sounds normal. No respiratory distress. She has no wheezes.  Abdominal: Soft. Bowel sounds are normal. There is no tenderness.  Musculoskeletal: She exhibits no edema or tenderness.  Lymphadenopathy:    She has no cervical adenopathy.  Skin: No rash noted. No erythema.  Psychiatric: She has a normal mood and affect. Her behavior is normal.    BP 110/68 (BP Location: Left Arm, Patient Position: Sitting, Cuff Size: Normal)   Pulse 67   Temp 98.5 F (36.9 C) (Oral)   Resp 12   Ht 5\' 11"  (1.803 m)   Wt 160 lb 6.4 oz (72.8 kg)   SpO2 98%   BMI 22.37 kg/m  Wt Readings from Last 3 Encounters:  10/03/16 160 lb 6.4 oz (72.8 kg)  05/18/16 160 lb 9.6 oz (72.8 kg)  03/30/16 159 lb 3.2 oz (72.2 kg)     Lab Results  Component Value Date   WBC 5.2 10/03/2016   HGB 12.8 10/03/2016   HCT 37.9 10/03/2016   PLT 229.0 10/03/2016   GLUCOSE 109 (H) 03/30/2016   CHOL 208 (H) 03/30/2016   TRIG 85.0 03/30/2016   HDL 87.90 03/30/2016   LDLCALC 103 (H) 03/30/2016   ALT 19 03/30/2016   AST 26 03/30/2016   NA 142 03/30/2016   K 4.7 03/30/2016   CL 102 03/30/2016   CREATININE 0.80 03/30/2016   BUN 17 03/30/2016   CO2 34 (H) 03/30/2016   TSH 3.10 03/30/2016   HGBA1C 5.7 05/02/2016    Mm Screening Breast Tomo Bilateral  Result Date: 05/30/2016 CLINICAL DATA:  Screening. EXAM: 2D DIGITAL SCREENING BILATERAL MAMMOGRAM WITH CAD AND ADJUNCT TOMO COMPARISON:  Previous exam(s). ACR Breast Density Category b: There are scattered areas of fibroglandular density. FINDINGS: There are no findings suspicious for malignancy. Images were processed with CAD. IMPRESSION: No mammographic evidence of malignancy. A result letter of this screening mammogram will be mailed directly to the patient. RECOMMENDATION: Screening mammogram in one year. (Code:SM-B-01Y) BI-RADS CATEGORY  1: Negative. Electronically Signed   By: Frederico Hamman M.D.   On: 05/30/2016 16:06       Assessment & Plan:    Problem List Items Addressed This Visit    GERD (gastroesophageal reflux disease)    Controlled on omeprazole.        History of syncope    Had a syncopal episode as outlined.  Evaluated in ER.  W/up and evaluation reviewed.  No reoccurring episodes.  Doing well.  Follow.        Menopausal syndrome    On prempro.  Discussed desire to get her off the medication.  With hot flashes.  Discussed other treatment options.  Wants to remain on for now.  Aware of risk.         Other Visit Diagnoses    Anemia, unspecified type    -  Primary   Found on ER labs.  recheck cbc today.    Relevant Orders   CBC with Differential/Platelet (Completed)       Dale [place], MD

## 2016-10-05 ENCOUNTER — Encounter: Payer: Self-pay | Admitting: Internal Medicine

## 2016-10-05 DIAGNOSIS — Z87898 Personal history of other specified conditions: Secondary | ICD-10-CM | POA: Insufficient documentation

## 2016-10-05 NOTE — Assessment & Plan Note (Signed)
On prempro.  Discussed desire to get her off the medication.  With hot flashes.  Discussed other treatment options.  Wants to remain on for now.  Aware of risk.

## 2016-10-05 NOTE — Assessment & Plan Note (Signed)
Controlled on omeprazole.   

## 2016-10-05 NOTE — Assessment & Plan Note (Signed)
Had a syncopal episode as outlined.  Evaluated in ER.  W/up and evaluation reviewed.  No reoccurring episodes.  Doing well.  Follow.

## 2017-04-09 ENCOUNTER — Encounter: Payer: BLUE CROSS/BLUE SHIELD | Admitting: Internal Medicine

## 2017-06-26 ENCOUNTER — Other Ambulatory Visit (HOSPITAL_COMMUNITY)
Admission: RE | Admit: 2017-06-26 | Discharge: 2017-06-26 | Disposition: A | Discharge status: Home / Self Care | Payer: BLUE CROSS/BLUE SHIELD | Source: Ambulatory Visit | Attending: Internal Medicine | Admitting: Internal Medicine

## 2017-06-26 ENCOUNTER — Ambulatory Visit (INDEPENDENT_AMBULATORY_CARE_PROVIDER_SITE_OTHER): Payer: BLUE CROSS/BLUE SHIELD | Admitting: Internal Medicine

## 2017-06-26 ENCOUNTER — Encounter: Payer: Self-pay | Admitting: Internal Medicine

## 2017-06-26 VITALS — BP 122/74 | HR 70 | Temp 98.8°F | Resp 18 | Wt 161.6 lb

## 2017-06-26 DIAGNOSIS — Z0000 Encounter for general adult medical examination without abnormal findings: Secondary | ICD-10-CM

## 2017-06-26 DIAGNOSIS — K219 Gastro-esophageal reflux disease without esophagitis: Secondary | ICD-10-CM | POA: Diagnosis not present

## 2017-06-26 DIAGNOSIS — Z124 Encounter for screening for malignant neoplasm of cervix: Secondary | ICD-10-CM | POA: Diagnosis not present

## 2017-06-26 DIAGNOSIS — J069 Acute upper respiratory infection, unspecified: Secondary | ICD-10-CM

## 2017-06-26 DIAGNOSIS — Z13220 Encounter for screening for lipoid disorders: Secondary | ICD-10-CM | POA: Diagnosis not present

## 2017-06-26 DIAGNOSIS — Z1231 Encounter for screening mammogram for malignant neoplasm of breast: Secondary | ICD-10-CM | POA: Diagnosis not present

## 2017-06-26 DIAGNOSIS — R739 Hyperglycemia, unspecified: Secondary | ICD-10-CM

## 2017-06-26 DIAGNOSIS — Z1239 Encounter for other screening for malignant neoplasm of breast: Secondary | ICD-10-CM

## 2017-06-26 MED ORDER — OMEPRAZOLE 20 MG PO CPDR: 20.0000 mg | DELAYED_RELEASE_CAPSULE | Freq: Every day | ORAL | 3 refills | Status: DC

## 2017-06-26 MED ORDER — ACYCLOVIR 5 % EX OINT: TOPICAL_OINTMENT | CUTANEOUS | 0 refills | Status: DC

## 2017-06-26 MED ORDER — BENZONATATE 100 MG PO CAPS: 100.0000 mg | ORAL_CAPSULE | Freq: Three times a day (TID) | ORAL | 0 refills | Status: DC | PRN

## 2017-06-26 NOTE — Progress Notes (Signed)
Patient ID: Sarah Perry, female   DOB: Nov 08, 1950, 67 y.o.   MRN: [id number]   Subjective:    Patient ID: Sarah Perry, female    DOB: May 26, 1950, 67 y.o.   MRN: [id number]  HPI  Patient here for her physical exam.  She reports she has been doing relatively well.  She has had previous sore throat and chest congestion.  Previous low grade temp - 99-100.  Started last weekend.  No sinus pressure.  Taking mucinex DM and nyquil.  She is better.  No headache.  No increased cough.  No chest pain or sob.  No acid reflux.  No abdominal pain.  Bowels moving.  No vaginal problem.     Past Medical History:  Diagnosis Date  . Basal cell carcinoma   . Degenerative arthritis    hips, knees  . GERD (gastroesophageal reflux disease)   . Varicella zoster 10/09   Past Surgical History:  Procedure Laterality Date  . BASAL CELL CARCINOMA EXCISION    . DILATION AND CURETTAGE OF UTERUS    . TONSILECTOMY/ADENOIDECTOMY WITH MYRINGOTOMY     Family History  Problem Relation Age of Onset  . Ovarian cancer Mother   . Hypercholesterolemia Mother   . Dementia Father   . Prostate cancer Father   . Breast cancer Paternal Aunt   . Pancreatic cancer Maternal Aunt    Social History   Socioeconomic History  . Marital status: Married    Spouse name: Not on file  . Number of children: 2  . Years of education: Not on file  . Highest education level: Not on file  Occupational History  . Not on file  Social Needs  . Financial resource strain: Not on file  . Food insecurity:    Worry: Not on file    Inability: Not on file  . Transportation needs:    Medical: Not on file    Non-medical: Not on file  Tobacco Use  . Smoking status: Never Smoker  . Smokeless tobacco: Never Used  Substance and Sexual Activity  . Alcohol use: Yes    Alcohol/week: 0.0 oz    Comment: glass of wine daily  . Drug use: No  . Sexual activity: Not on file  Lifestyle  . Physical activity:    Days per week: Not on file   Minutes per session: Not on file  . Stress: Not on file  Relationships  . Social connections:    Talks on phone: Not on file    Gets together: Not on file    Attends religious service: Not on file    Active member of club or organization: Not on file    Attends meetings of clubs or organizations: Not on file    Relationship status: Not on file  Other Topics Concern  . Not on file  Social History Narrative  . Not on file    Outpatient Encounter Medications as of 06/26/2017  Medication Sig  . acyclovir ointment (ZOVIRAX) 5 % Apply to affected area on lip qid.  . benzonatate (TESSALON) 100 MG capsule Take 1 capsule (100 mg total) by mouth 3 (three) times daily as needed for cough.  . calcium-vitamin D (CALCIUM 500+D) 500-400 MG-UNIT per tablet Take 1 tablet by mouth daily.   Marland Kitchen estrogen, conjugated,-medroxyprogesterone (PREMPRO) 0.3-1.5 MG tablet TAKE ONE TABLET Every day  . glucosamine-chondroitin 500-400 MG tablet Take 2 tablets by mouth daily.   Marland Kitchen ibuprofen (ADVIL,MOTRIN) 200 MG tablet Take 200 mg by mouth  daily.  . latanoprost (XALATAN) 0.005 % ophthalmic solution   . Multiple Vitamin (MULTIVITAMIN) tablet Take 1 tablet by mouth daily.  Marland Kitchen omeprazole (PRILOSEC) 20 MG capsule Take 1 capsule (20 mg total) by mouth daily.  . [DISCONTINUED] omeprazole (PRILOSEC) 20 MG capsule Take 1 capsule (20 mg total) by mouth daily.   No facility-administered encounter medications on file as of 06/26/2017.     Review of Systems  Constitutional: Negative for appetite change and unexpected weight change.  HENT: Positive for congestion and sore throat. Negative for sinus pressure.   Eyes: Negative for pain and visual disturbance.  Respiratory: Negative for chest tightness and shortness of breath.        Chest congestion.   Cardiovascular: Negative for chest pain, palpitations and leg swelling.  Gastrointestinal: Negative for abdominal pain, diarrhea, nausea and vomiting.  Genitourinary: Negative for  difficulty urinating and dysuria.  Musculoskeletal: Negative for joint swelling and myalgias.  Skin: Negative for color change and rash.  Neurological: Negative for dizziness, light-headedness and headaches.  Hematological: Negative for adenopathy. Does not bruise/bleed easily.  Psychiatric/Behavioral: Negative for agitation and dysphoric mood.       Objective:    Physical Exam  Constitutional: She is oriented to person, place, and time. She appears well-developed and well-nourished. No distress.  HENT:  Nose: Nose normal.  Mouth/Throat: Oropharynx is clear and moist.  Eyes: Right eye exhibits no discharge. Left eye exhibits no discharge. No scleral icterus.  Neck: Neck supple. No thyromegaly present.  Cardiovascular: Normal rate and regular rhythm.  Pulmonary/Chest: Breath sounds normal. No accessory muscle usage. No tachypnea. No respiratory distress. She has no decreased breath sounds. She has no wheezes. She has no rhonchi. Right breast exhibits no inverted nipple, no mass, no nipple discharge and no tenderness (no axillary adenopathy). Left breast exhibits no inverted nipple, no mass, no nipple discharge and no tenderness (no axilarry adenopathy).  Abdominal: Soft. Bowel sounds are normal. There is no tenderness.  Genitourinary:  Genitourinary Comments: Normal external genitalia.  Vaginal vault without lesions.  Cervix identified.  Pap smear performed.  Could not appreciate any adnexal masses or tenderness.    Musculoskeletal: She exhibits no edema or tenderness.  Lymphadenopathy:    She has no cervical adenopathy.  Neurological: She is alert and oriented to person, place, and time.  Skin: Skin is warm. No rash noted. No erythema.  Psychiatric: She has a normal mood and affect. Her behavior is normal.    BP 122/74 (BP Location: Left Arm, Patient Position: Sitting, Cuff Size: Normal)   Pulse 70   Temp 98.8 F (37.1 C) (Oral)   Resp 18   Wt 161 lb 9.6 oz (73.3 kg)   SpO2 98%    BMI 22.54 kg/m  Wt Readings from Last 3 Encounters:  06/26/17 161 lb 9.6 oz (73.3 kg)  10/03/16 160 lb 6.4 oz (72.8 kg)  05/18/16 160 lb 9.6 oz (72.8 kg)     Lab Results  Component Value Date   WBC 5.2 10/03/2016   HGB 12.8 10/03/2016   HCT 37.9 10/03/2016   PLT 229.0 10/03/2016   GLUCOSE 109 (H) 03/30/2016   CHOL 208 (H) 03/30/2016   TRIG 85.0 03/30/2016   HDL 87.90 03/30/2016   LDLCALC 103 (H) 03/30/2016   ALT 19 03/30/2016   AST 26 03/30/2016   NA 142 03/30/2016   K 4.7 03/30/2016   CL 102 03/30/2016   CREATININE 0.80 03/30/2016   BUN 17 03/30/2016   CO2 34 (  H) 03/30/2016   TSH 3.10 03/30/2016   HGBA1C 5.7 05/02/2016    Mm Screening Breast Tomo Bilateral  Result Date: 05/30/2016 CLINICAL DATA:  Screening. EXAM: 2D DIGITAL SCREENING BILATERAL MAMMOGRAM WITH CAD AND ADJUNCT TOMO COMPARISON:  Previous exam(s). ACR Breast Density Category b: There are scattered areas of fibroglandular density. FINDINGS: There are no findings suspicious for malignancy. Images were processed with CAD. IMPRESSION: No mammographic evidence of malignancy. A result letter of this screening mammogram will be mailed directly to the patient. RECOMMENDATION: Screening mammogram in one year. (Code:SM-B-01Y) BI-RADS CATEGORY  1: Negative. Electronically Signed   By: Frederico Hamman M.D.   On: 05/30/2016 16:06       Assessment & Plan:   Problem List Items Addressed This Visit    GERD (gastroesophageal reflux disease)    Controlled on omeprazole.        Relevant Medications   omeprazole (PRILOSEC) 20 MG capsule   Health care maintenance    Physical today 06/26/17.  PAP 06/26/17.  mammmogram 05/30/16 - [facility] I.  Schedule f/u mammogram.  Colonoscopy 2012.  Per pt, recommended f/u in 2022.        Hyperglycemia    Low carb diet and exercise.  Follow met b and a1c.        Relevant Orders   CBC with Differential/Platelet   Hemoglobin A1c   Hepatic function panel   TSH   Basic metabolic panel      Other Visit Diagnoses    Routine general medical examination at a health care facility    -  Primary   Breast cancer screening       Relevant Orders   MM Digital Screening   Screening for cervical cancer       Relevant Orders   Cytology - PAP (Completed)   Upper respiratory tract infection, unspecified type       Symptoms improved.  Mucinex DM and nasacort nasal spray as directed.  Notify me if symptoms worsen or do not resolve.    Relevant Medications   acyclovir ointment (ZOVIRAX) 5 %   Screening cholesterol level       Relevant Orders   Lipid panel       Dale Harper, MD

## 2017-06-26 NOTE — Patient Instructions (Signed)
nasacort nasal spray - 2 sprays each nostril one time per day.  Do this in the evening.    mucinex DM in the am and robitussin DM in the pm.

## 2017-06-26 NOTE — Assessment & Plan Note (Signed)
Physical today 06/26/17.  PAP 06/26/17.  mammmogram 05/30/16 - [facility] I.  Schedule f/u mammogram.  Colonoscopy 2012.  Per pt, recommended f/u in 2022.

## 2017-06-28 ENCOUNTER — Encounter: Payer: Self-pay | Admitting: Internal Medicine

## 2017-06-28 LAB — CYTOLOGY - PAP
Diagnosis: NEGATIVE
HPV: NOT DETECTED

## 2017-07-01 ENCOUNTER — Encounter: Payer: Self-pay | Admitting: Internal Medicine

## 2017-07-01 DIAGNOSIS — R739 Hyperglycemia, unspecified: Secondary | ICD-10-CM | POA: Insufficient documentation

## 2017-07-01 NOTE — Assessment & Plan Note (Signed)
Low carb diet and exercise.  Follow met b and a1c.   

## 2017-07-01 NOTE — Assessment & Plan Note (Signed)
Controlled on omeprazole.   

## 2017-07-04 ENCOUNTER — Other Ambulatory Visit (INDEPENDENT_AMBULATORY_CARE_PROVIDER_SITE_OTHER): Payer: BLUE CROSS/BLUE SHIELD

## 2017-07-04 DIAGNOSIS — R739 Hyperglycemia, unspecified: Secondary | ICD-10-CM | POA: Diagnosis not present

## 2017-07-04 DIAGNOSIS — Z13220 Encounter for screening for lipoid disorders: Secondary | ICD-10-CM

## 2017-07-04 LAB — CBC WITH DIFFERENTIAL/PLATELET
BASOS ABS: 0.1 10*3/uL (ref 0.0–0.1)
Basophils Relative: 1.5 % (ref 0.0–3.0)
EOS PCT: 1.9 % (ref 0.0–5.0)
Eosinophils Absolute: 0.1 10*3/uL (ref 0.0–0.7)
HCT: 33 % — ABNORMAL LOW (ref 36.0–46.0)
Hemoglobin: 11.4 g/dL — ABNORMAL LOW (ref 12.0–15.0)
LYMPHS ABS: 1.3 10*3/uL (ref 0.7–4.0)
LYMPHS PCT: 28.7 % (ref 12.0–46.0)
MCHC: 34.6 g/dL (ref 30.0–36.0)
MCV: 91.3 fl (ref 78.0–100.0)
MONOS PCT: 8.8 % (ref 3.0–12.0)
Monocytes Absolute: 0.4 10*3/uL (ref 0.1–1.0)
NEUTROS ABS: 2.6 10*3/uL (ref 1.4–7.7)
Neutrophils Relative %: 59.1 % (ref 43.0–77.0)
PLATELETS: 332 10*3/uL (ref 150.0–400.0)
RBC: 3.61 Mil/uL — ABNORMAL LOW (ref 3.87–5.11)
RDW: 12.9 % (ref 11.5–15.5)
WBC: 4.4 10*3/uL (ref 4.0–10.5)

## 2017-07-04 LAB — HEPATIC FUNCTION PANEL
ALK PHOS: 83 U/L (ref 39–117)
ALT: 15 U/L (ref 0–35)
AST: 22 U/L (ref 0–37)
Albumin: 4.1 g/dL (ref 3.5–5.2)
BILIRUBIN DIRECT: 0.1 mg/dL (ref 0.0–0.3)
BILIRUBIN TOTAL: 0.4 mg/dL (ref 0.2–1.2)
Total Protein: 7.1 g/dL (ref 6.0–8.3)

## 2017-07-04 LAB — LIPID PANEL
CHOL/HDL RATIO: 2
Cholesterol: 177 mg/dL (ref 0–200)
HDL: 76.7 mg/dL (ref >39.00)
LDL Cholesterol: 87 mg/dL (ref 0–99)
NONHDL: 100.59
Triglycerides: 69 mg/dL (ref 0.0–149.0)
VLDL: 13.8 mg/dL (ref 0.0–40.0)

## 2017-07-04 LAB — BASIC METABOLIC PANEL
BUN: 16 mg/dL (ref 6–23)
CALCIUM: 9.6 mg/dL (ref 8.4–10.5)
CO2: 31 mEq/L (ref 19–32)
Chloride: 100 mEq/L (ref 96–112)
Creatinine, Ser: 0.75 mg/dL (ref 0.40–1.20)
GFR: 82.55 mL/min (ref >60.00)
GLUCOSE: 110 mg/dL — AB (ref 70–99)
Potassium: 4.3 mEq/L (ref 3.5–5.1)
Sodium: 139 mEq/L (ref 135–145)

## 2017-07-04 LAB — HEMOGLOBIN A1C: Hgb A1c MFr Bld: 5.7 % (ref 4.6–6.5)

## 2017-07-04 LAB — TSH: TSH: 3.47 u[IU]/mL (ref 0.35–4.50)

## 2017-07-05 ENCOUNTER — Other Ambulatory Visit: Payer: Self-pay | Admitting: Internal Medicine

## 2017-07-05 DIAGNOSIS — D649 Anemia, unspecified: Secondary | ICD-10-CM

## 2017-07-05 NOTE — Progress Notes (Signed)
Order placed for f/u labs.  

## 2017-07-13 ENCOUNTER — Ambulatory Visit
Admission: RE | Admit: 2017-07-13 | Discharge: 2017-07-13 | Disposition: A | Discharge status: Home / Self Care | Payer: BLUE CROSS/BLUE SHIELD | Source: Ambulatory Visit | Attending: Internal Medicine | Admitting: Internal Medicine

## 2017-07-13 DIAGNOSIS — Z1239 Encounter for other screening for malignant neoplasm of breast: Secondary | ICD-10-CM

## 2017-07-13 DIAGNOSIS — Z1231 Encounter for screening mammogram for malignant neoplasm of breast: Secondary | ICD-10-CM | POA: Insufficient documentation

## 2017-07-17 ENCOUNTER — Other Ambulatory Visit (INDEPENDENT_AMBULATORY_CARE_PROVIDER_SITE_OTHER): Payer: BLUE CROSS/BLUE SHIELD

## 2017-07-17 DIAGNOSIS — D649 Anemia, unspecified: Secondary | ICD-10-CM

## 2017-07-17 LAB — IBC PANEL
IRON: 53 ug/dL (ref 42–145)
SATURATION RATIOS: 15.8 % — AB (ref 20.0–50.0)
TRANSFERRIN: 239 mg/dL (ref 212.0–360.0)

## 2017-07-17 LAB — CBC WITH DIFFERENTIAL/PLATELET
Basophils Absolute: 0.1 10*3/uL (ref 0.0–0.1)
Basophils Relative: 1.5 % (ref 0.0–3.0)
Eosinophils Absolute: 0.1 10*3/uL (ref 0.0–0.7)
Eosinophils Relative: 1.5 % (ref 0.0–5.0)
HCT: 33.1 % — ABNORMAL LOW (ref 36.0–46.0)
HEMOGLOBIN: 11.5 g/dL — AB (ref 12.0–15.0)
LYMPHS ABS: 1.5 10*3/uL (ref 0.7–4.0)
Lymphocytes Relative: 33.4 % (ref 12.0–46.0)
MCHC: 34.6 g/dL (ref 30.0–36.0)
MCV: 89.9 fl (ref 78.0–100.0)
MONO ABS: 0.5 10*3/uL (ref 0.1–1.0)
Monocytes Relative: 10.8 % (ref 3.0–12.0)
NEUTROS PCT: 52.8 % (ref 43.0–77.0)
Neutro Abs: 2.3 10*3/uL (ref 1.4–7.7)
Platelets: 279 10*3/uL (ref 150.0–400.0)
RBC: 3.68 Mil/uL — AB (ref 3.87–5.11)
RDW: 13.5 % (ref 11.5–15.5)
WBC: 4.4 10*3/uL (ref 4.0–10.5)

## 2017-07-17 LAB — VITAMIN B12: Vitamin B-12: 723 pg/mL (ref 211–911)

## 2017-07-17 LAB — FERRITIN: FERRITIN: 16.3 ng/mL (ref 10.0–291.0)

## 2017-07-18 ENCOUNTER — Other Ambulatory Visit: Payer: Self-pay | Admitting: Internal Medicine

## 2017-07-18 DIAGNOSIS — D649 Anemia, unspecified: Secondary | ICD-10-CM

## 2017-07-18 NOTE — Progress Notes (Signed)
Order placed for f/u lab.   

## 2017-07-19 ENCOUNTER — Other Ambulatory Visit: Payer: Self-pay | Admitting: Internal Medicine

## 2017-07-19 DIAGNOSIS — D509 Iron deficiency anemia, unspecified: Secondary | ICD-10-CM

## 2017-07-19 NOTE — Progress Notes (Signed)
Order placed for GI referral.   

## 2017-08-29 ENCOUNTER — Other Ambulatory Visit (INDEPENDENT_AMBULATORY_CARE_PROVIDER_SITE_OTHER): Payer: BLUE CROSS/BLUE SHIELD

## 2017-08-29 DIAGNOSIS — D649 Anemia, unspecified: Secondary | ICD-10-CM

## 2017-08-30 LAB — CBC WITH DIFFERENTIAL/PLATELET
Basophils Absolute: 0 10*3/uL (ref 0.0–0.1)
Basophils Relative: 0.5 % (ref 0.0–3.0)
EOS PCT: 1.2 % (ref 0.0–5.0)
Eosinophils Absolute: 0.1 10*3/uL (ref 0.0–0.7)
HCT: 35.2 % — ABNORMAL LOW (ref 36.0–46.0)
Hemoglobin: 11.8 g/dL — ABNORMAL LOW (ref 12.0–15.0)
LYMPHS ABS: 2 10*3/uL (ref 0.7–4.0)
Lymphocytes Relative: 28.4 % (ref 12.0–46.0)
MCHC: 33.5 g/dL (ref 30.0–36.0)
MCV: 89.1 fl (ref 78.0–100.0)
MONO ABS: 0.5 10*3/uL (ref 0.1–1.0)
Monocytes Relative: 7.8 % (ref 3.0–12.0)
NEUTROS ABS: 4.3 10*3/uL (ref 1.4–7.7)
NEUTROS PCT: 62.1 % (ref 43.0–77.0)
PLATELETS: 268 10*3/uL (ref 150.0–400.0)
RBC: 3.96 Mil/uL (ref 3.87–5.11)
RDW: 13.7 % (ref 11.5–15.5)
WBC: 7 10*3/uL (ref 4.0–10.5)

## 2017-08-30 LAB — FERRITIN: Ferritin: 22.4 ng/mL (ref 10.0–291.0)

## 2017-12-12 ENCOUNTER — Ambulatory Visit (INDEPENDENT_AMBULATORY_CARE_PROVIDER_SITE_OTHER): Payer: Medicare HMO | Admitting: *Deleted

## 2017-12-12 DIAGNOSIS — Z23 Encounter for immunization: Secondary | ICD-10-CM | POA: Diagnosis not present

## 2017-12-27 ENCOUNTER — Ambulatory Visit: Payer: BLUE CROSS/BLUE SHIELD | Admitting: Internal Medicine

## 2017-12-31 ENCOUNTER — Ambulatory Visit (INDEPENDENT_AMBULATORY_CARE_PROVIDER_SITE_OTHER): Payer: Medicare HMO | Admitting: Internal Medicine

## 2017-12-31 ENCOUNTER — Encounter: Payer: Self-pay | Admitting: Internal Medicine

## 2017-12-31 VITALS — BP 118/70 | HR 60 | Temp 98.1°F | Resp 18 | Wt 162.0 lb

## 2017-12-31 DIAGNOSIS — D509 Iron deficiency anemia, unspecified: Secondary | ICD-10-CM

## 2017-12-31 DIAGNOSIS — N951 Menopausal and female climacteric states: Secondary | ICD-10-CM | POA: Diagnosis not present

## 2017-12-31 DIAGNOSIS — K219 Gastro-esophageal reflux disease without esophagitis: Secondary | ICD-10-CM

## 2017-12-31 DIAGNOSIS — R739 Hyperglycemia, unspecified: Secondary | ICD-10-CM | POA: Diagnosis not present

## 2017-12-31 DIAGNOSIS — Z23 Encounter for immunization: Secondary | ICD-10-CM | POA: Diagnosis not present

## 2017-12-31 DIAGNOSIS — D649 Anemia, unspecified: Secondary | ICD-10-CM | POA: Insufficient documentation

## 2017-12-31 LAB — CBC WITH DIFFERENTIAL/PLATELET
BASOS PCT: 1.2 % (ref 0.0–3.0)
Basophils Absolute: 0.1 10*3/uL (ref 0.0–0.1)
EOS PCT: 1.6 % (ref 0.0–5.0)
Eosinophils Absolute: 0.1 10*3/uL (ref 0.0–0.7)
HCT: 35.4 % — ABNORMAL LOW (ref 36.0–46.0)
Hemoglobin: 12.2 g/dL (ref 12.0–15.0)
LYMPHS ABS: 1.2 10*3/uL (ref 0.7–4.0)
Lymphocytes Relative: 25.7 % (ref 12.0–46.0)
MCHC: 34.4 g/dL (ref 30.0–36.0)
MCV: 90.7 fl (ref 78.0–100.0)
MONOS PCT: 10 % (ref 3.0–12.0)
Monocytes Absolute: 0.5 10*3/uL (ref 0.1–1.0)
NEUTROS ABS: 3 10*3/uL (ref 1.4–7.7)
NEUTROS PCT: 61.5 % (ref 43.0–77.0)
PLATELETS: 234 10*3/uL (ref 150.0–400.0)
RBC: 3.9 Mil/uL (ref 3.87–5.11)
RDW: 13.9 % (ref 11.5–15.5)
WBC: 4.9 10*3/uL (ref 4.0–10.5)

## 2017-12-31 LAB — HEMOGLOBIN A1C: Hgb A1c MFr Bld: 6 % (ref 4.6–6.5)

## 2017-12-31 LAB — BASIC METABOLIC PANEL
BUN: 21 mg/dL (ref 6–23)
CALCIUM: 9.6 mg/dL (ref 8.4–10.5)
CO2: 31 mEq/L (ref 19–32)
Chloride: 102 mEq/L (ref 96–112)
Creatinine, Ser: 0.77 mg/dL (ref 0.40–1.20)
GFR: 79.95 mL/min (ref >60.00)
GLUCOSE: 97 mg/dL (ref 70–99)
Potassium: 4.9 mEq/L (ref 3.5–5.1)
Sodium: 139 mEq/L (ref 135–145)

## 2017-12-31 LAB — FERRITIN: Ferritin: 25.8 ng/mL (ref 10.0–291.0)

## 2017-12-31 MED ORDER — VENLAFAXINE HCL ER 37.5 MG PO CP24: 37.5000 mg | ORAL_CAPSULE | Freq: Every day | ORAL | 2 refills | Status: DC

## 2017-12-31 NOTE — Progress Notes (Signed)
Patient ID: Sarah Perry, female   DOB: [birth year], 67 y.o.   MRN: [id number]   Subjective:    Patient ID: Sarah Perry, female    DOB: 1950-08-18, 67 y.o.   MRN: [id number]  HPI  Patient here for a scheduled follow up.  She reports she is doing relatively well.  Does report increased problems with hot flashes.  Off estrogen.  Tapered off.  Affecting her sleep.  Discussed treatment options.  She is interested in starting effexor.  Tries to stay active.  No chest pain.  No sob.  No acid reflux.  No abdominal pain.  Bowels moving.  Had her flu shot.     Past Medical History:  Diagnosis Date  . Basal cell carcinoma   . Degenerative arthritis    hips, knees  . GERD (gastroesophageal reflux disease)   . Varicella zoster 10/09   Past Surgical History:  Procedure Laterality Date  . BASAL CELL CARCINOMA EXCISION    . DILATION AND CURETTAGE OF UTERUS    . TONSILECTOMY/ADENOIDECTOMY WITH MYRINGOTOMY     Family History  Problem Relation Age of Onset  . Ovarian cancer Mother   . Hypercholesterolemia Mother   . Dementia Father   . Prostate cancer Father   . Breast cancer Paternal Aunt   . Pancreatic cancer Maternal Aunt    Social History   Socioeconomic History  . Marital status: Married    Spouse name: Not on file  . Number of children: 2  . Years of education: Not on file  . Highest education level: Not on file  Occupational History  . Not on file  Social Needs  . Financial resource strain: Not on file  . Food insecurity:    Worry: Not on file    Inability: Not on file  . Transportation needs:    Medical: Not on file    Non-medical: Not on file  Tobacco Use  . Smoking status: Never Smoker  . Smokeless tobacco: Never Used  Substance and Sexual Activity  . Alcohol use: Yes    Alcohol/week: 0.0 standard drinks    Comment: glass of wine daily  . Drug use: No  . Sexual activity: Not on file  Lifestyle  . Physical activity:    Days per week: Not on file    Minutes per  session: Not on file  . Stress: Not on file  Relationships  . Social connections:    Talks on phone: Not on file    Gets together: Not on file    Attends religious service: Not on file    Active member of club or organization: Not on file    Attends meetings of clubs or organizations: Not on file    Relationship status: Not on file  Other Topics Concern  . Not on file  Social History Narrative  . Not on file    Outpatient Encounter Medications as of 12/31/2017  Medication Sig  . acyclovir ointment (ZOVIRAX) 5 % Apply to affected area on lip qid.  . calcium-vitamin D (CALCIUM 500+D) 500-400 MG-UNIT per tablet Take 1 tablet by mouth daily.   Marland Kitchen glucosamine-chondroitin 500-400 MG tablet Take 2 tablets by mouth daily.   Marland Kitchen latanoprost (XALATAN) 0.005 % ophthalmic solution   . Multiple Vitamin (MULTIVITAMIN) tablet Take 1 tablet by mouth daily.  Marland Kitchen omeprazole (PRILOSEC) 20 MG capsule Take 1 capsule (20 mg total) by mouth daily.  Marland Kitchen venlafaxine XR (EFFEXOR XR) 37.5 MG 24 hr capsule Take 1 capsule (  37.5 mg total) by mouth daily with breakfast.  . [DISCONTINUED] benzonatate (TESSALON) 100 MG capsule Take 1 capsule (100 mg total) by mouth 3 (three) times daily as needed for cough.  . [DISCONTINUED] estrogen, conjugated,-medroxyprogesterone (PREMPRO) 0.3-1.5 MG tablet TAKE ONE TABLET Every day  . [DISCONTINUED] ibuprofen (ADVIL,MOTRIN) 200 MG tablet Take 200 mg by mouth daily.   No facility-administered encounter medications on file as of 12/31/2017.     Review of Systems  Constitutional: Negative for appetite change and unexpected weight change.  HENT: Negative for congestion and sinus pressure.   Respiratory: Negative for cough, chest tightness and shortness of breath.   Cardiovascular: Negative for chest pain, palpitations and leg swelling.  Gastrointestinal: Negative for abdominal pain, diarrhea, nausea and vomiting.  Endocrine:       Hot flashes as outlined.    Genitourinary: Negative  for difficulty urinating and dysuria.  Musculoskeletal: Negative for joint swelling and myalgias.  Skin: Negative for color change and rash.  Neurological: Negative for dizziness, light-headedness and headaches.  Psychiatric/Behavioral: Negative for agitation and dysphoric mood.       Objective:     Blood pressure rechecked by me:  118/78  Physical Exam  Constitutional: She appears well-developed and well-nourished. No distress.  HENT:  Nose: Nose normal.  Mouth/Throat: Oropharynx is clear and moist.  Neck: Neck supple. No thyromegaly present.  Cardiovascular: Normal rate and regular rhythm.  Pulmonary/Chest: Breath sounds normal. No respiratory distress. She has no wheezes.  Abdominal: Soft. Bowel sounds are normal. There is no tenderness.  Musculoskeletal: She exhibits no edema or tenderness.  Lymphadenopathy:    She has no cervical adenopathy.  Skin: No rash noted. No erythema.  Psychiatric: She has a normal mood and affect. Her behavior is normal.    BP 118/70 (BP Location: Left Arm, Patient Position: Sitting, Cuff Size: Normal)   Pulse 60   Temp 98.1 F (36.7 C) (Oral)   Resp 18   Wt 162 lb (73.5 kg)   SpO2 97%   BMI 22.59 kg/m  Wt Readings from Last 3 Encounters:  12/31/17 162 lb (73.5 kg)  06/26/17 161 lb 9.6 oz (73.3 kg)  10/03/16 160 lb 6.4 oz (72.8 kg)     Lab Results  Component Value Date   WBC 4.9 12/31/2017   HGB 12.2 12/31/2017   HCT 35.4 (L) 12/31/2017   PLT 234.0 12/31/2017   GLUCOSE 97 12/31/2017   CHOL 177 07/04/2017   TRIG 69.0 07/04/2017   HDL 76.70 07/04/2017   LDLCALC 87 07/04/2017   ALT 15 07/04/2017   AST 22 07/04/2017   NA 139 12/31/2017   K 4.9 12/31/2017   CL 102 12/31/2017   CREATININE 0.77 12/31/2017   BUN 21 12/31/2017   CO2 31 12/31/2017   TSH 3.47 07/04/2017   HGBA1C 6.0 12/31/2017    Mm Screening Breast Tomo Bilateral  Result Date: 07/13/2017 CLINICAL DATA:  Screening. EXAM: DIGITAL SCREENING BILATERAL MAMMOGRAM WITH  TOMO AND CAD COMPARISON:  Previous exam(s). ACR Breast Density Category c: The breast tissue is heterogeneously dense, which may obscure small masses. FINDINGS: There are no findings suspicious for malignancy. Images were processed with CAD. IMPRESSION: No mammographic evidence of malignancy. A result letter of this screening mammogram will be mailed directly to the patient. RECOMMENDATION: Screening mammogram in one year. (Code:SM-B-01Y) BI-RADS CATEGORY  1: Negative. Electronically Signed   By: Sherian Rein M.D.   On: 07/13/2017 11:07       Assessment & Plan:  Problem List Items Addressed This Visit    Anemia    Saw GI.  Elected to wait for f/u cbc and iron check.  Note reviewed.  Recheck cbc and ferritin today.        Relevant Orders   CBC with Differential/Platelet (Completed)   Ferritin (Completed)   GERD (gastroesophageal reflux disease)    Controlled on current regimen.        Hyperglycemia - Primary    Low carb diet and exercise.  Follow met b and a1c.        Relevant Orders   Hemoglobin A1c (Completed)   Basic metabolic panel (Completed)   Menopausal syndrome    Previously on prempro.  Tapered off.  With increased hot flashes now.  Discussed treatment options.  Start effexor.  Follow.         Other Visit Diagnoses    Need for vaccination with 13-polyvalent pneumococcal conjugate vaccine       Relevant Orders   Pneumococcal conjugate vaccine 13-valent (Completed)       Dale [place], MD

## 2018-01-06 ENCOUNTER — Encounter: Payer: Self-pay | Admitting: Internal Medicine

## 2018-01-06 NOTE — Assessment & Plan Note (Signed)
Saw GI.  Elected to wait for f/u cbc and iron check.  Note reviewed.  Recheck cbc and ferritin today.

## 2018-01-06 NOTE — Assessment & Plan Note (Signed)
Low carb diet and exercise.  Follow met b and a1c.   

## 2018-01-06 NOTE — Assessment & Plan Note (Signed)
Previously on prempro.  Tapered off.  With increased hot flashes now.  Discussed treatment options.  Start effexor.  Follow.

## 2018-01-06 NOTE — Assessment & Plan Note (Signed)
Controlled on current regimen.   

## 2018-02-08 DIAGNOSIS — H40053 Ocular hypertension, bilateral: Secondary | ICD-10-CM | POA: Diagnosis not present

## 2018-02-11 DIAGNOSIS — Z1283 Encounter for screening for malignant neoplasm of skin: Secondary | ICD-10-CM | POA: Diagnosis not present

## 2018-02-11 DIAGNOSIS — L820 Inflamed seborrheic keratosis: Secondary | ICD-10-CM | POA: Diagnosis not present

## 2018-02-11 DIAGNOSIS — L812 Freckles: Secondary | ICD-10-CM | POA: Diagnosis not present

## 2018-02-11 DIAGNOSIS — C44719 Basal cell carcinoma of skin of left lower limb, including hip: Secondary | ICD-10-CM | POA: Diagnosis not present

## 2018-02-11 DIAGNOSIS — C44712 Basal cell carcinoma of skin of right lower limb, including hip: Secondary | ICD-10-CM | POA: Diagnosis not present

## 2018-02-11 DIAGNOSIS — D485 Neoplasm of uncertain behavior of skin: Secondary | ICD-10-CM | POA: Diagnosis not present

## 2018-02-11 DIAGNOSIS — D1800 Hemangioma unspecified site: Secondary | ICD-10-CM | POA: Diagnosis not present

## 2018-02-11 DIAGNOSIS — L578 Other skin changes due to chronic exposure to nonionizing radiation: Secondary | ICD-10-CM | POA: Diagnosis not present

## 2018-02-11 DIAGNOSIS — L821 Other seborrheic keratosis: Secondary | ICD-10-CM | POA: Diagnosis not present

## 2018-03-01 ENCOUNTER — Encounter: Payer: Self-pay | Admitting: Internal Medicine

## 2018-03-01 ENCOUNTER — Ambulatory Visit (INDEPENDENT_AMBULATORY_CARE_PROVIDER_SITE_OTHER): Payer: Medicare HMO | Admitting: Internal Medicine

## 2018-03-01 DIAGNOSIS — K219 Gastro-esophageal reflux disease without esophagitis: Secondary | ICD-10-CM | POA: Diagnosis not present

## 2018-03-01 DIAGNOSIS — D509 Iron deficiency anemia, unspecified: Secondary | ICD-10-CM

## 2018-03-01 DIAGNOSIS — N951 Menopausal and female climacteric states: Secondary | ICD-10-CM | POA: Diagnosis not present

## 2018-03-01 MED ORDER — VENLAFAXINE HCL ER 37.5 MG PO CP24: 37.5000 mg | ORAL_CAPSULE | Freq: Every day | ORAL | 5 refills | Status: DC

## 2018-03-01 NOTE — Progress Notes (Signed)
Patient ID: Sarah Perry, female   DOB: 12-May-1950, 67 y.o.   MRN: [id number]   Subjective:    Patient ID: Sarah Perry, female    DOB: Sep 14, 1950, 67 y.o.   MRN: [id number]  HPI  Patient here for a scheduled follow up.  Last visit, she was started on effexor for hot flashes.  On low dose.  Doing well on the medication.  Feels better.  Hot flashes better.  Stays active. No chest pain. No sob.  No acid reflux.  No abdominal pain. Bowels moving.  Overall feels good.     Past Medical History:  Diagnosis Date  . Basal cell carcinoma   . Degenerative arthritis    hips, knees  . GERD (gastroesophageal reflux disease)   . Varicella zoster 10/09   Past Surgical History:  Procedure Laterality Date  . BASAL CELL CARCINOMA EXCISION    . DILATION AND CURETTAGE OF UTERUS    . TONSILECTOMY/ADENOIDECTOMY WITH MYRINGOTOMY     Family History  Problem Relation Age of Onset  . Ovarian cancer Mother   . Hypercholesterolemia Mother   . Dementia Father   . Prostate cancer Father   . Breast cancer Paternal Aunt   . Pancreatic cancer Maternal Aunt    Social History   Socioeconomic History  . Marital status: Married    Spouse name: Not on file  . Number of children: 2  . Years of education: Not on file  . Highest education level: Not on file  Occupational History  . Not on file  Social Needs  . Financial resource strain: Not on file  . Food insecurity:    Worry: Not on file    Inability: Not on file  . Transportation needs:    Medical: Not on file    Non-medical: Not on file  Tobacco Use  . Smoking status: Never Smoker  . Smokeless tobacco: Never Used  Substance and Sexual Activity  . Alcohol use: Yes    Alcohol/week: 0.0 standard drinks    Comment: glass of wine daily  . Drug use: No  . Sexual activity: Not on file  Lifestyle  . Physical activity:    Days per week: Not on file    Minutes per session: Not on file  . Stress: Not on file  Relationships  . Social connections:    Talks on phone: Not on file    Gets together: Not on file    Attends religious service: Not on file    Active member of club or organization: Not on file    Attends meetings of clubs or organizations: Not on file    Relationship status: Not on file  Other Topics Concern  . Not on file  Social History Narrative  . Not on file    Outpatient Encounter Medications as of 03/01/2018  Medication Sig  . acyclovir ointment (ZOVIRAX) 5 % Apply to affected area on lip qid.  . calcium-vitamin D (CALCIUM 500+D) 500-400 MG-UNIT per tablet Take 1 tablet by mouth daily.   Marland Kitchen glucosamine-chondroitin 500-400 MG tablet Take 2 tablets by mouth daily.   Marland Kitchen latanoprost (XALATAN) 0.005 % ophthalmic solution   . Multiple Vitamin (MULTIVITAMIN) tablet Take 1 tablet by mouth daily.  Marland Kitchen omeprazole (PRILOSEC) 20 MG capsule Take 1 capsule (20 mg total) by mouth daily.  Marland Kitchen venlafaxine XR (EFFEXOR XR) 37.5 MG 24 hr capsule Take 1 capsule (37.5 mg total) by mouth daily with breakfast.  . [DISCONTINUED] venlafaxine XR (EFFEXOR XR) 37.5  MG 24 hr capsule Take 1 capsule (37.5 mg total) by mouth daily with breakfast.   No facility-administered encounter medications on file as of 03/01/2018.     Review of Systems  Constitutional: Negative for appetite change and unexpected weight change.  HENT: Negative for congestion and sinus pressure.   Respiratory: Negative for cough, chest tightness and shortness of breath.   Cardiovascular: Negative for chest pain, palpitations and leg swelling.  Gastrointestinal: Negative for abdominal pain, diarrhea, nausea and vomiting.  Genitourinary: Negative for difficulty urinating and dysuria.  Musculoskeletal: Negative for joint swelling and myalgias.  Skin: Negative for color change and rash.  Neurological: Negative for dizziness, light-headedness and headaches.  Psychiatric/Behavioral: Negative for agitation and dysphoric mood.       Objective:    Physical Exam  Constitutional:  She appears well-developed and well-nourished. No distress.  HENT:  Nose: Nose normal.  Mouth/Throat: Oropharynx is clear and moist.  Neck: Neck supple. No thyromegaly present.  Cardiovascular: Normal rate and regular rhythm.  Pulmonary/Chest: Breath sounds normal. No respiratory distress. She has no wheezes.  Abdominal: Soft. Bowel sounds are normal. There is no tenderness.  Musculoskeletal: She exhibits no edema or tenderness.  Lymphadenopathy:    She has no cervical adenopathy.  Skin: No rash noted. No erythema.  Psychiatric: She has a normal mood and affect. Her behavior is normal.    BP 118/76 (BP Location: Left Arm, Patient Position: Sitting, Cuff Size: Normal)   Pulse 65   Temp 98.2 F (36.8 C) (Oral)   Resp 18   Wt 157 lb 9.6 oz (71.5 kg)   SpO2 98%   BMI 21.98 kg/m  Wt Readings from Last 3 Encounters:  03/01/18 157 lb 9.6 oz (71.5 kg)  12/31/17 162 lb (73.5 kg)  06/26/17 161 lb 9.6 oz (73.3 kg)     Lab Results  Component Value Date   WBC 4.9 12/31/2017   HGB 12.2 12/31/2017   HCT 35.4 (L) 12/31/2017   PLT 234.0 12/31/2017   GLUCOSE 97 12/31/2017   CHOL 177 07/04/2017   TRIG 69.0 07/04/2017   HDL 76.70 07/04/2017   LDLCALC 87 07/04/2017   ALT 15 07/04/2017   AST 22 07/04/2017   NA 139 12/31/2017   K 4.9 12/31/2017   CL 102 12/31/2017   CREATININE 0.77 12/31/2017   BUN 21 12/31/2017   CO2 31 12/31/2017   TSH 3.47 07/04/2017   HGBA1C 6.0 12/31/2017    Mm Screening Breast Tomo Bilateral  Result Date: 07/13/2017 CLINICAL DATA:  Screening. EXAM: DIGITAL SCREENING BILATERAL MAMMOGRAM WITH TOMO AND CAD COMPARISON:  Previous exam(s). ACR Breast Density Category c: The breast tissue is heterogeneously dense, which may obscure small masses. FINDINGS: There are no findings suspicious for malignancy. Images were processed with CAD. IMPRESSION: No mammographic evidence of malignancy. A result letter of this screening mammogram will be mailed directly to the patient.  RECOMMENDATION: Screening mammogram in one year. (Code:SM-B-01Y) BI-RADS CATEGORY  1: Negative. Electronically Signed   By: Sherian Rein M.D.   On: 07/13/2017 11:07       Assessment & Plan:   Problem List Items Addressed This Visit    Anemia    Saw GI.  Most recent hgb wnl.  Spoke with GI.  Recommended to continue to follow.        GERD (gastroesophageal reflux disease)    Controlled on current regimen.  Follow.        Menopausal syndrome    Off prempro.  On  effexor now.  Doing well on low dose.  Hot flashes improved.  Follow.            Dale [place], MD

## 2018-03-10 ENCOUNTER — Encounter: Payer: Self-pay | Admitting: Internal Medicine

## 2018-03-10 NOTE — Assessment & Plan Note (Signed)
Controlled on current regimen.  Follow.  

## 2018-03-10 NOTE — Assessment & Plan Note (Signed)
Off prempro.  On effexor now.  Doing well on low dose.  Hot flashes improved.  Follow.

## 2018-03-10 NOTE — Assessment & Plan Note (Signed)
Saw GI.  Most recent hgb wnl.  Spoke with GI.  Recommended to continue to follow.

## 2018-03-15 DIAGNOSIS — C44712 Basal cell carcinoma of skin of right lower limb, including hip: Secondary | ICD-10-CM | POA: Diagnosis not present

## 2018-03-15 DIAGNOSIS — L578 Other skin changes due to chronic exposure to nonionizing radiation: Secondary | ICD-10-CM | POA: Diagnosis not present

## 2018-03-15 DIAGNOSIS — L820 Inflamed seborrheic keratosis: Secondary | ICD-10-CM | POA: Diagnosis not present

## 2018-03-15 DIAGNOSIS — C44719 Basal cell carcinoma of skin of left lower limb, including hip: Secondary | ICD-10-CM | POA: Diagnosis not present

## 2018-06-29 ENCOUNTER — Encounter: Payer: Self-pay | Admitting: Internal Medicine

## 2018-07-01 ENCOUNTER — Other Ambulatory Visit: Payer: Self-pay

## 2018-07-01 MED ORDER — OMEPRAZOLE 20 MG PO CPDR: 20.0000 mg | DELAYED_RELEASE_CAPSULE | Freq: Every day | ORAL | 3 refills | Status: DC

## 2018-08-28 ENCOUNTER — Other Ambulatory Visit: Payer: Self-pay | Admitting: Internal Medicine

## 2018-08-28 DIAGNOSIS — Z1231 Encounter for screening mammogram for malignant neoplasm of breast: Secondary | ICD-10-CM

## 2018-09-12 ENCOUNTER — Other Ambulatory Visit: Payer: Self-pay

## 2018-09-12 ENCOUNTER — Encounter: Payer: Self-pay | Admitting: Internal Medicine

## 2018-09-12 ENCOUNTER — Ambulatory Visit (INDEPENDENT_AMBULATORY_CARE_PROVIDER_SITE_OTHER): Payer: Medicare HMO | Admitting: Internal Medicine

## 2018-09-12 DIAGNOSIS — D509 Iron deficiency anemia, unspecified: Secondary | ICD-10-CM | POA: Diagnosis not present

## 2018-09-12 DIAGNOSIS — K219 Gastro-esophageal reflux disease without esophagitis: Secondary | ICD-10-CM | POA: Diagnosis not present

## 2018-09-12 DIAGNOSIS — H40053 Ocular hypertension, bilateral: Secondary | ICD-10-CM | POA: Diagnosis not present

## 2018-09-12 DIAGNOSIS — R739 Hyperglycemia, unspecified: Secondary | ICD-10-CM | POA: Diagnosis not present

## 2018-09-12 DIAGNOSIS — N951 Menopausal and female climacteric states: Secondary | ICD-10-CM | POA: Diagnosis not present

## 2018-09-12 MED ORDER — VENLAFAXINE HCL ER 37.5 MG PO CP24: 37.5000 mg | ORAL_CAPSULE | Freq: Every day | ORAL | 1 refills | Status: DC

## 2018-09-12 MED ORDER — VENLAFAXINE HCL ER 75 MG PO CP24: 75.0000 mg | ORAL_CAPSULE | Freq: Every day | ORAL | 1 refills | Status: DC

## 2018-09-12 NOTE — Progress Notes (Signed)
Patient ID: Sarah Perry, female   DOB: [birth year], 68 y.o.   MRN: [id number]   Virtual Visit via video Note  This visit type was conducted due to national recommendations for restrictions regarding the COVID-19 pandemic (e.g. social distancing).  This format is felt to be most appropriate for this patient at this time.  All issues noted in this document were discussed and addressed.  No physical exam was performed (except for noted visual exam findings with Video Visits).   I connected with Sarah Perry by a video enabled telemedicine application and verified that I am speaking with the correct person using two identifiers. Location patient: home Location provider: work  Persons participating in the virtual visit: patient, provider  I discussed the limitations, risks, security and privacy concerns of performing an evaluation and management service by video and the availability of in person appointments. The patient expressed understanding and agreed to proceed.   Reason for visit: scheduled follow up.   HPI: She reports she is doing relatively well.  On effexor.  Feel has helped, but still having hot flashes.  Would like to increase the dose.  Tries to stay active.  No chest pain.  No sob.  No acid reflux.  No abdominal pain.  Bowels moving.  Scheduled for mammogram tomorrow.  Was evaluated by GI for anemia.  Recommended following.  Last hgb wnl.     ROS: See pertinent positives and negatives per HPI.  Past Medical History:  Diagnosis Date  . Basal cell carcinoma   . Degenerative arthritis    hips, knees  . GERD (gastroesophageal reflux disease)   . History of basal cell carcinoma (BCC)    Multiple BCC's  . Varicella zoster 10/09    Past Surgical History:  Procedure Laterality Date  . BASAL CELL CARCINOMA EXCISION    . DILATION AND CURETTAGE OF UTERUS    . TONSILECTOMY/ADENOIDECTOMY WITH MYRINGOTOMY      Family History  Problem Relation Age of Onset  . Ovarian cancer Mother    . Hypercholesterolemia Mother   . Dementia Father   . Prostate cancer Father   . Breast cancer Paternal Aunt   . Pancreatic cancer Maternal Aunt     SOCIAL HX: reviewed.    Current Outpatient Medications:  .  acyclovir ointment (ZOVIRAX) 5 %, Apply to affected area on lip qid., Disp: 30 g, Rfl: 0 .  calcium-vitamin D (CALCIUM 500+D) 500-400 MG-UNIT per tablet, Take 1 tablet by mouth daily. , Disp: , Rfl:  .  latanoprost (XALATAN) 0.005 % ophthalmic solution, , Disp: , Rfl: 3 .  Multiple Vitamin (MULTIVITAMIN) tablet, Take 1 tablet by mouth daily., Disp: , Rfl:  .  omeprazole (PRILOSEC) 20 MG capsule, Take 1 capsule (20 mg total) by mouth daily., Disp: 90 capsule, Rfl: 3 .  venlafaxine XR (EFFEXOR XR) 75 MG 24 hr capsule, Take 1 capsule (75 mg total) by mouth daily., Disp: 90 capsule, Rfl: 1  EXAM:  GENERAL: alert, oriented, appears well and in no acute distress  HEENT: atraumatic, conjunttiva clear, no obvious abnormalities on inspection of external nose and ears  NECK: normal movements of the head and neck  LUNGS: on inspection no signs of respiratory distress, breathing rate appears normal, no obvious gross SOB, gasping or wheezing  CV: no obvious cyanosis  PSYCH/NEURO: pleasant and cooperative, no obvious depression or anxiety, speech and thought processing grossly intact  ASSESSMENT AND PLAN:  Discussed the following assessment and plan:  Iron deficiency anemia, unspecified  iron deficiency anemia type - Plan: Ferritin, CBC with Differential/Platelet  Gastroesophageal reflux disease without esophagitis  Hyperglycemia - Plan: Hemoglobin A1c, Lipid panel, Hepatic function panel, Basic metabolic panel  Menopausal syndrome - Plan: TSH  Anemia Saw GI.  Most recent hgb wnl.  Spoke with GI.  Recommended continuing to follow.    GERD (gastroesophageal reflux disease) Controlled on current regimen.  Follow.    Hyperglycemia Low carb diet and exercise.  Follow met b and  a1c.    Menopausal syndrome On effexor 37.5mg  q day.  Has helped with hot flashes.  Still with hot flashes.  Increase effexor to 75mg  q day.  Follow.      I discussed the assessment and treatment plan with the patient. The patient was provided an opportunity to ask questions and all were answered. The patient agreed with the plan and demonstrated an understanding of the instructions.   The patient was advised to call back or seek an in-person evaluation if the symptoms worsen or if the condition fails to improve as anticipated.    Dale [place], MD

## 2018-09-13 ENCOUNTER — Encounter: Payer: Self-pay | Admitting: Internal Medicine

## 2018-09-13 ENCOUNTER — Ambulatory Visit
Admission: RE | Admit: 2018-09-13 | Discharge: 2018-09-13 | Disposition: A | Discharge status: Home / Self Care | Payer: Medicare HMO | Source: Ambulatory Visit | Attending: Internal Medicine | Admitting: Internal Medicine

## 2018-09-13 DIAGNOSIS — Z1231 Encounter for screening mammogram for malignant neoplasm of breast: Secondary | ICD-10-CM | POA: Diagnosis not present

## 2018-09-15 ENCOUNTER — Encounter: Payer: Self-pay | Admitting: Internal Medicine

## 2018-09-15 NOTE — Assessment & Plan Note (Signed)
Controlled on current regimen.  Follow.  

## 2018-09-15 NOTE — Assessment & Plan Note (Signed)
Low carb diet and exercise.  Follow met b and a1c.   

## 2018-09-15 NOTE — Assessment & Plan Note (Signed)
Saw GI.  Most recent hgb wnl.  Spoke with GI.  Recommended continuing to follow.

## 2018-09-15 NOTE — Assessment & Plan Note (Signed)
On effexor 37.5mg  q day.  Has helped with hot flashes.  Still with hot flashes.  Increase effexor to 75mg  q day.  Follow.

## 2018-09-17 DIAGNOSIS — C44612 Basal cell carcinoma of skin of right upper limb, including shoulder: Secondary | ICD-10-CM | POA: Diagnosis not present

## 2018-09-17 DIAGNOSIS — Z85828 Personal history of other malignant neoplasm of skin: Secondary | ICD-10-CM | POA: Diagnosis not present

## 2018-09-17 DIAGNOSIS — C44719 Basal cell carcinoma of skin of left lower limb, including hip: Secondary | ICD-10-CM | POA: Diagnosis not present

## 2018-10-14 DIAGNOSIS — R69 Illness, unspecified: Secondary | ICD-10-CM | POA: Diagnosis not present

## 2018-10-24 ENCOUNTER — Emergency Department: Payer: Medicare HMO

## 2018-10-24 ENCOUNTER — Emergency Department
Admission: EM | Admit: 2018-10-24 | Discharge: 2018-10-24 | Disposition: A | Discharge status: Home / Self Care | Payer: Medicare HMO | Attending: Emergency Medicine | Admitting: Emergency Medicine

## 2018-10-24 ENCOUNTER — Other Ambulatory Visit: Payer: Self-pay

## 2018-10-24 ENCOUNTER — Encounter: Payer: Self-pay | Admitting: Internal Medicine

## 2018-10-24 ENCOUNTER — Encounter: Payer: Self-pay | Admitting: Emergency Medicine

## 2018-10-24 DIAGNOSIS — R29810 Facial weakness: Secondary | ICD-10-CM | POA: Diagnosis not present

## 2018-10-24 DIAGNOSIS — Z85828 Personal history of other malignant neoplasm of skin: Secondary | ICD-10-CM | POA: Insufficient documentation

## 2018-10-24 DIAGNOSIS — Z79899 Other long term (current) drug therapy: Secondary | ICD-10-CM | POA: Insufficient documentation

## 2018-10-24 DIAGNOSIS — G9389 Other specified disorders of brain: Secondary | ICD-10-CM | POA: Diagnosis not present

## 2018-10-24 DIAGNOSIS — R51 Headache: Secondary | ICD-10-CM | POA: Diagnosis not present

## 2018-10-24 DIAGNOSIS — G510 Bell's palsy: Secondary | ICD-10-CM | POA: Diagnosis not present

## 2018-10-24 DIAGNOSIS — R531 Weakness: Secondary | ICD-10-CM | POA: Diagnosis not present

## 2018-10-24 LAB — DIFFERENTIAL
Abs Immature Granulocytes: 0.01 10*3/uL (ref 0.00–0.07)
Basophils Absolute: 0 10*3/uL (ref 0.0–0.1)
Basophils Relative: 1 %
Eosinophils Absolute: 0.1 10*3/uL (ref 0.0–0.5)
Eosinophils Relative: 2 %
Immature Granulocytes: 0 %
Lymphocytes Relative: 25 %
Lymphs Abs: 1.4 10*3/uL (ref 0.7–4.0)
Monocytes Absolute: 0.4 10*3/uL (ref 0.1–1.0)
Monocytes Relative: 7 %
Neutro Abs: 3.8 10*3/uL (ref 1.7–7.7)
Neutrophils Relative %: 65 %

## 2018-10-24 LAB — CBC
HCT: 38 % (ref 36.0–46.0)
Hemoglobin: 12.6 g/dL (ref 12.0–15.0)
MCH: 30.7 pg (ref 26.0–34.0)
MCHC: 33.2 g/dL (ref 30.0–36.0)
MCV: 92.7 fL (ref 80.0–100.0)
Platelets: 231 10*3/uL (ref 150–400)
RBC: 4.1 MIL/uL (ref 3.87–5.11)
RDW: 12.4 % (ref 11.5–15.5)
WBC: 5.8 10*3/uL (ref 4.0–10.5)
nRBC: 0 % (ref 0.0–0.2)

## 2018-10-24 LAB — COMPREHENSIVE METABOLIC PANEL
ALT: 32 U/L (ref 0–44)
AST: 38 U/L (ref 15–41)
Albumin: 4.6 g/dL (ref 3.5–5.0)
Alkaline Phosphatase: 110 U/L (ref 38–126)
Anion gap: 10 (ref 5–15)
BUN: 18 mg/dL (ref 8–23)
CO2: 29 mmol/L (ref 22–32)
Calcium: 9.7 mg/dL (ref 8.9–10.3)
Chloride: 98 mmol/L (ref 98–111)
Creatinine, Ser: 0.81 mg/dL (ref 0.44–1.00)
GFR calc Af Amer: >60 mL/min (ref >60)
GFR calc non Af Amer: >60 mL/min (ref >60)
Glucose, Bld: 108 mg/dL — ABNORMAL HIGH (ref 70–99)
Potassium: 4 mmol/L (ref 3.5–5.1)
Sodium: 137 mmol/L (ref 135–145)
Total Bilirubin: 0.6 mg/dL (ref 0.3–1.2)
Total Protein: 7.4 g/dL (ref 6.5–8.1)

## 2018-10-24 LAB — PROTIME-INR
INR: 0.9 (ref 0.8–1.2)
Prothrombin Time: 12.3 seconds (ref 11.4–15.2)

## 2018-10-24 LAB — GLUCOSE, CAPILLARY: Glucose-Capillary: 96 mg/dL (ref 70–99)

## 2018-10-24 LAB — APTT: aPTT: 27 seconds (ref 24–36)

## 2018-10-24 MED ORDER — PREDNISONE 20 MG PO TABS: 60.0000 mg | ORAL_TABLET | Freq: Every day | ORAL | 0 refills | Status: AC

## 2018-10-24 MED ORDER — VALACYCLOVIR HCL 500 MG PO TABS: 1000.0000 mg | ORAL_TABLET | Freq: Three times a day (TID) | ORAL | 0 refills | Status: AC

## 2018-10-24 NOTE — ED Notes (Signed)
MD at bedside. 

## 2018-10-24 NOTE — ED Notes (Signed)
Sent rainbow to lab. 

## 2018-10-24 NOTE — ED Triage Notes (Signed)
Patient presents to the ED with left sided facial droop/muscle weakness since Monday.  Patient also reports slight occasional headache.  Denies any other symptoms.  Patient's grip strength is strong, no arm drift.  Walking with steady gait.  Speech is clear at this time.

## 2018-10-24 NOTE — Discharge Instructions (Addendum)
You need to follow up with your primary doctor. You will need MRI with contrast and then need to follow up with Neurosurgery.   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  Return to Ed, vomiting, fevers, vision changes or other concerns.   Infiltrating extra-axial mass involving the sella, right cavernous sinus, and clivus. Favor meningioma. Differential diagnosis includes metastatic disease and lymphoma. Pituitary adenoma not considered likely. Mild mass-effect on the right cavernous sinus.   Recommend follow-up MRI with contrast which will be needed to accurately delineate tumor margins.

## 2018-10-24 NOTE — Telephone Encounter (Signed)
Called pt. Confirmed no left sided weakness in extremities. Pt is going to go to ED or urgent care for evaluation.

## 2018-10-24 NOTE — ED Provider Notes (Signed)
[facility] Emergency Department Provider Note  ____________________________________________   First MD Initiated Contact with Patient 10/24/18 1412     (approximate)  I have reviewed the triage vital signs and the nursing notes.   HISTORY  Chief Complaint Facial Droop    HPI Sarah Perry is a 68 y.o. female otherwise healthy who presents for facial droop.  Patient says for the past 4 days she has had weakness on the left side of her face.  Patient says she can close her left eye but feels like it is not closing completely and her smile is off.  This is been constant, nothing makes it better, nothing makes it worse.  Denies having this previously.  Is otherwise been totally fine.  Denies any arm weakness.  Denies any leg weakness or difficulties ambulating. Denies vision changes.           Past Medical History:  Diagnosis Date   Basal cell carcinoma    Degenerative arthritis    hips, knees   GERD (gastroesophageal reflux disease)    History of basal cell carcinoma (BCC)    Multiple BCC's   Varicella zoster 10/09    Patient Active Problem List   Diagnosis Date Noted   Anemia 12/31/2017   Hyperglycemia 07/01/2017   History of syncope 10/05/2016   Cough 05/18/2016   Health care maintenance 03/30/2015   GERD (gastroesophageal reflux disease) 03/29/2012   Menopausal syndrome 03/29/2012   Degenerative arthritis 03/29/2012    Past Surgical History:  Procedure Laterality Date   BASAL CELL CARCINOMA EXCISION     DILATION AND CURETTAGE OF UTERUS     TONSILECTOMY/ADENOIDECTOMY WITH MYRINGOTOMY      Prior to Admission medications   Medication Sig Start Date End Date Taking? Authorizing Provider  acyclovir ointment (ZOVIRAX) 5 % Apply to affected area on lip qid. 06/26/17   Einar Pheasant, MD  calcium-vitamin D (CALCIUM 500+D) 500-400 MG-UNIT per tablet Take 1 tablet by mouth daily.     [provider]  latanoprost  (XALATAN) 0.005 % ophthalmic solution  06/29/16   [provider]  Multiple Vitamin (MULTIVITAMIN) tablet Take 1 tablet by mouth daily.    [provider]  omeprazole (PRILOSEC) 20 MG capsule Take 1 capsule (20 mg total) by mouth daily. 07/01/18   Einar Pheasant, MD  venlafaxine XR (EFFEXOR XR) 75 MG 24 hr capsule Take 1 capsule (75 mg total) by mouth daily. 09/12/18   Einar Pheasant, MD    Allergies Penicillins  Family History  Problem Relation Age of Onset   Ovarian cancer Mother    Hypercholesterolemia Mother    Dementia Father    Prostate cancer Father    Breast cancer Paternal Aunt    Pancreatic cancer Maternal Aunt     Social History Social History   Tobacco Use   Smoking status: Never Smoker   Smokeless tobacco: Never Used  Substance Use Topics   Alcohol use: Yes    Alcohol/week: 0.0 standard drinks    Comment: glass of wine daily   Drug use: No      Review of Systems Constitutional: No fever/chills Eyes: droopy eye lid ENT: No sore throat. Facial droop  Cardiovascular: Denies chest pain. Respiratory: Denies shortness of breath. Gastrointestinal: No abdominal pain.  No nausea, no vomiting.  No diarrhea.  No constipation. Genitourinary: Negative for dysuria. Musculoskeletal: Negative for back pain. Skin: Negative for rash. Neurological: positive Facial asymmetry. All other ROS negative ____________________________________________   PHYSICAL EXAM:  VITAL SIGNS: Blood pressure (!) 155/90, pulse 62, resp. rate 15, height 6' (1.829 m), weight 71.2 kg, SpO2 98 %.  Constitutional: Alert and oriented. Well appearing and in no acute distress. Eyes: Conjunctivae are normal. EOMI. Pupils reactive.  Head: Atraumatic. Nose: No congestion/rhinnorhea. Mouth/Throat: Mucous membranes are moist.   Neck: No stridor. Trachea Midline. FROM Cardiovascular: Normal rate, regular rhythm. Grossly normal heart sounds.  Good peripheral  circulation. Respiratory: Normal respiratory effort.  No retractions. Lungs CTAB. Gastrointestinal: Soft and nontender. No distention. No abdominal bruits.  Musculoskeletal: No lower extremity tenderness nor edema.  No joint effusions. Neurologic: Left facial droop, inability to fully close the left eye is tight.  Forehead muscles intact bilaterally. Skin:  Skin is warm, dry and intact. No rash noted. Psychiatric: Mood and affect are normal. Speech and behavior are normal. GU: Deferred   ____________________________________________   LABS (all labs ordered are listed, but only abnormal results are displayed)  Labs Reviewed  COMPREHENSIVE METABOLIC PANEL - Abnormal; Notable for the following components:      Result Value   Glucose, Bld 108 (*)    All other components within normal limits  PROTIME-INR  APTT  CBC  DIFFERENTIAL  GLUCOSE, CAPILLARY  CBG MONITORING, ED   ____________________________________________   ED ECG REPORT I, Vanessa Sharon, the attending physician, personally viewed and interpreted this ECG.  EKG is normal sinus rate of 60, no ST elevation, no T wave inversion, normal intervals, normal axis ____________________________________________  RADIOLOGY   Official radiology report(s): Ct Head Wo Contrast  Result Date: 10/24/2018 CLINICAL DATA:  Left-sided facial droop EXAM: CT HEAD WITHOUT CONTRAST TECHNIQUE: Contiguous axial images were obtained from the base of the skull through the vertex without intravenous contrast. COMPARISON:  None. FINDINGS: Brain: The ventricles and sulci are within normal limits for age. Left lateral ventricle is slightly larger than the right lateral ventricle, a likely anatomic variant. There is no intracranial mass, hemorrhage, extra-axial fluid collection, or midline shift. There is evidence of a prior small lacunar type infarct in the inferior left cerebellar hemisphere. Elsewhere brain parenchyma appears unremarkable. No acute  infarct is evident. Vascular: There is no hyperdense vessel. There is calcification in the right carotid siphon region. Skull: The bony calvarium appears intact. Sinuses/Orbits: Visualized paranasal sinuses are clear. Visualized orbits appear symmetric bilaterally. Other: Visualized mastoid air cells are clear. 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 No acute infarct evident. No mass or hemorrhage. Right carotid siphon region arterial vascular calcification noted. Electronically Signed   By: Lowella Grip III M.D.   On: 10/24/2018 14:53   Mr Brain Wo Contrast  Result Date: 10/24/2018 CLINICAL DATA:  Left facial droop and weakness.  Headache. EXAM: MRI HEAD WITHOUT CONTRAST TECHNIQUE: Multiplanar, multiecho pulse sequences of the brain and surrounding structures were obtained without intravenous contrast. COMPARISON:  CT head 10/24/2018 FINDINGS: Brain: Negative for acute infarct. Several tiny white matter hyperintensities may be due to chronic ischemia. Negative for hemorrhage Mass lesion in the sella and right cavernous sinus. The mass appears to surround the right cavernous carotid which remains patent. The mass is difficult to separate from the pituitary but appears to infiltrate the sella. Mass extends along the clivus. The sella is not enlarged. Mild up lifting of the optic chiasm and optic nerve on the right with mild compression. Ventricle size normal. Vascular: Normal arterial flow voids Skull and upper cervical spine: Negative Sinuses/Orbits: Mild mucosal edema paranasal sinuses.  Normal orbit  Other: None IMPRESSION: Infiltrating extra-axial mass involving the sella, right cavernous sinus, and clivus. Favor meningioma. Differential diagnosis includes metastatic disease and lymphoma. Pituitary adenoma not considered likely. Mild mass-effect on the right cavernous sinus. Recommend follow-up MRI with contrast which will be needed to accurately delineate  tumor margins. Negative for acute infarct. These results were called by telephone at the time of interpretation on 10/24/2018 at 6:03 pm to Dr. Marjean Donna , who verbally acknowledged these results. Electronically Signed   By: Franchot Gallo M.D.   On: 10/24/2018 18:04    ____________________________________________   PROCEDURES  Procedure(s) performed (including Critical Care):  Procedures   ____________________________________________   INITIAL IMPRESSION / ASSESSMENT AND PLAN / ED COURSE  Zarina Pe Ghrist was evaluated in Emergency Department on 10/24/2018 for the symptoms described in the history of present illness. She was evaluated in the context of the global COVID-19 pandemic, which necessitated consideration that the patient might be at risk for infection with the SARS-CoV-2 virus that causes COVID-19. Institutional protocols and algorithms that pertain to the evaluation of patients at risk for COVID-19 are in a state of rapid change based on information released by regulatory bodies including the CDC and federal and state organizations. These policies and algorithms were followed during the patient's care in the ED.    Patient exam is concerning for stroke versus Bell's palsy.  Patient does have intact forehead muscles which makes a central process more concerning.  However patient denies any other symptoms.  Patient was out of the time window for TPA or stroke code.  Will get CT scan to evaluate for mass, stroke, bleed.  If negative more likely bells palsy.   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 No acute infarct evident. No mass or hemorrhage.  Right carotid siphon region arterial vascular calcification noted.  Patient does have an old stroke will get MRI.  If MRI is positive for new stroke will admit patient.  Patient prefers to manage this outpatient if possible.  Mri concerning for tumor in the cavernous sinus on the R.  They  recommended MRI with contrast.   Offered pt MRI with contrast here but she preferred to go home and f/u outpatient. She denies any vision loss to suggest symptoms from this possible tumor. She understands she needs MRI with contrast and given NS number to follow up with. She understands if she develops symptoms to return to ED. Given this does not explain patients symptoms and her exam is concerning for bells palsy will rx as such with prednisone and valac. Given she is around a stage 3-4 bells palsy.  Pt given instruction to use eye shield and artificial tears to keep eye moist. Pt will call PCP tomorrow to make f/u.  I discussed the provisional nature of ED diagnosis, the treatment so far, the ongoing plan of care, follow up appointments and return precautions with the patient and any family or support people present. They expressed understanding and agreed with the plan, discharged home.     ____________________________________________   FINAL CLINICAL IMPRESSION(S) / ED DIAGNOSES   Final diagnoses:  Bell's palsy      MEDICATIONS GIVEN DURING THIS VISIT:  Medications - No data to display   ED Discharge Orders         Ordered    predniSONE (DELTASONE) 20 MG tablet  Daily with breakfast     10/24/18 1828    valACYclovir (VALTREX) 500 MG tablet  3 times daily     10/24/18 1828           Note:  This document was prepared using Dragon voice recognition software and may include unintentional dictation errors.   Vanessa [place], MD 10/24/18 1850

## 2018-10-24 NOTE — ED Notes (Signed)
PT is in MRI

## 2018-10-24 NOTE — ED Notes (Signed)
PT back to room. Provided warm blanket.

## 2018-10-29 ENCOUNTER — Ambulatory Visit
Admission: RE | Admit: 2018-10-29 | Discharge: 2018-10-29 | Disposition: A | Discharge status: Home / Self Care | Payer: Medicare HMO | Source: Ambulatory Visit | Attending: Internal Medicine | Admitting: Internal Medicine

## 2018-10-29 ENCOUNTER — Telehealth: Payer: Self-pay | Admitting: Internal Medicine

## 2018-10-29 ENCOUNTER — Other Ambulatory Visit: Payer: Self-pay

## 2018-10-29 ENCOUNTER — Encounter: Payer: Self-pay | Admitting: Internal Medicine

## 2018-10-29 ENCOUNTER — Telehealth (INDEPENDENT_AMBULATORY_CARE_PROVIDER_SITE_OTHER): Payer: Medicare HMO | Admitting: Internal Medicine

## 2018-10-29 DIAGNOSIS — G9389 Other specified disorders of brain: Secondary | ICD-10-CM

## 2018-10-29 DIAGNOSIS — R29810 Facial weakness: Secondary | ICD-10-CM | POA: Diagnosis not present

## 2018-10-29 DIAGNOSIS — I6381 Other cerebral infarction due to occlusion or stenosis of small artery: Secondary | ICD-10-CM

## 2018-10-29 DIAGNOSIS — R930 Abnormal findings on diagnostic imaging of skull and head, not elsewhere classified: Secondary | ICD-10-CM

## 2018-10-29 DIAGNOSIS — G510 Bell's palsy: Secondary | ICD-10-CM | POA: Insufficient documentation

## 2018-10-29 DIAGNOSIS — D329 Benign neoplasm of meninges, unspecified: Secondary | ICD-10-CM | POA: Insufficient documentation

## 2018-10-29 DIAGNOSIS — D320 Benign neoplasm of cerebral meninges: Secondary | ICD-10-CM | POA: Diagnosis not present

## 2018-10-29 MED ORDER — GADOBUTROL 1 MMOL/ML IV SOLN
7.0000 mL | Freq: Once | INTRAVENOUS | Status: AC | PRN
  Administered 2018-10-29: 7 mL via INTRAVENOUS

## 2018-10-29 NOTE — Assessment & Plan Note (Signed)
Found on MRI - prior infarct.  Discussed low dose baby aspirin.

## 2018-10-29 NOTE — Telephone Encounter (Signed)
Sarah Perry, with Dr. Nicki Reaper & Dr. Cari Car[facility] returning a missed call regarding a referral placed for the patient. Plese call back and also clarify which doctor patient is being referred to.

## 2018-10-29 NOTE — Assessment & Plan Note (Signed)
Discussed the need for further evaluation given extension of mass.  Schedule MRI with contrast.  Discussed with neurology and refer to neurosurgery.  Pt agreeable with follow up.

## 2018-10-29 NOTE — Telephone Encounter (Signed)
I think this should have been routed to you

## 2018-10-29 NOTE — Progress Notes (Signed)
Patient ID: Sarah Perry, female   DOB: February 05, 1951, 68 y.o.   MRN: [id number]   Virtual Visit via video Note  This visit type was conducted due to national recommendations for restrictions regarding the COVID-19 pandemic (e.g. social distancing).  This format is felt to be most appropriate for this patient at this time.  All issues noted in this document were discussed and addressed.  No physical exam was performed (except for noted visual exam findings with Video Visits).   I connected with [name] by a video enabled telemedicine application and verified that I am speaking with the correct person using two identifiers. Location patient: home Location provider: work  Persons participating in the virtual visit: patient, provider  I discussed the limitations, risks, security and privacy concerns of performing an evaluation and management service by video and the availability of in person appointments.  The patient expressed understanding and agreed to proceed.   Reason for visit: ER follow up.   HPI: She was seen in ER 10/24/18 with "facial droop".  Started experiencing symptoms 10/21/18.  Pt described her face feeling "puffy" and "just not looking right".  Eye burning and could not close her eye fully and could not open her mouth on the left side.  CT head prior small lacunar infarct in the inferior left cerebellar hemisphere.  No acute infarct evident.  No mass or hemorrhage. MRI revealed infiltrating extra axial mass involving the sella, right cavernous sinus and clivus.  Mild mass effect on the right cavernous sinus.  Recommended f/u MRI with contrast.  She elected to discharge home and f/u as outpatient.  They prescribed prednisone and valtrex.  She reports she is doing some better.  Eye is closing better.  Face does not feel as puffy.  Smile "improved".  No headache. No sinus congestion.  Eating and drinking well.  Swallowing ok.  Had questions about her CT.  Discussed further w/up and  evaluation.     ROS: See pertinent positives and negatives per HPI.  Past Medical History:  Diagnosis Date   Basal cell carcinoma    Degenerative arthritis    hips, knees   GERD (gastroesophageal reflux disease)    History of basal cell carcinoma (BCC)    Multiple BCC's   Varicella zoster 10/09    Past Surgical History:  Procedure Laterality Date   BASAL CELL CARCINOMA EXCISION     DILATION AND CURETTAGE OF UTERUS     TONSILECTOMY/ADENOIDECTOMY WITH MYRINGOTOMY      Family History  Problem Relation Age of Onset   Ovarian cancer Mother    Hypercholesterolemia Mother    Dementia Father    Prostate cancer Father    Breast cancer Paternal Aunt    Pancreatic cancer Maternal Aunt     SOCIAL HX: reviewed.    Current Outpatient Medications:    acyclovir ointment (ZOVIRAX) 5 %, Apply to affected area on lip qid., Disp: 30 g, Rfl: 0   calcium-vitamin D (CALCIUM 500+D) 500-400 MG-UNIT per tablet, Take 1 tablet by mouth daily. , Disp: , Rfl:    latanoprost (XALATAN) 0.005 % ophthalmic solution, , Disp: , Rfl: 3   Multiple Vitamin (MULTIVITAMIN) tablet, Take 1 tablet by mouth daily., Disp: , Rfl:    omeprazole (PRILOSEC) 20 MG capsule, Take 1 capsule (20 mg total) by mouth daily., Disp: 90 capsule, Rfl: 3   predniSONE (DELTASONE) 20 MG tablet, Take 3 tablets (60 mg total) by mouth daily with breakfast for 7 days., Disp: 21  tablet, Rfl: 0   valACYclovir (VALTREX) 500 MG tablet, Take 2 tablets (1,000 mg total) by mouth 3 (three) times daily for 7 days., Disp: 42 tablet, Rfl: 0   venlafaxine XR (EFFEXOR XR) 75 MG 24 hr capsule, Take 1 capsule (75 mg total) by mouth daily., Disp: 90 capsule, Rfl: 1  EXAM:  GENERAL: alert, oriented, appears well and in no acute distress  HEENT: atraumatic, conjunttiva clear, no obvious abnormalities on inspection of external nose and ears.  Able to raise both eye brows.  Appears to have some drooping left face.  Able to fully  close eyelids.    NECK: normal movements of the head and neck  LUNGS: on inspection no signs of respiratory distress, breathing rate appears normal, no obvious gross SOB, gasping or wheezing  CV: no obvious cyanosis  MS: moves all visible extremities without noticeable abnormality  PSYCH/NEURO: pleasant and cooperative, no obvious depression or anxiety, speech and thought processing grossly intact  ASSESSMENT AND PLAN:  Discussed the following assessment and plan:  Facial droop Was evaluated in ER with scans as outlined.  Treated for Bell's Palsy.  Discussed CT and MRI results.  Can complete prednisone and valtrex.  Discussed the need for f/u MRI with contrast.  Discussed with neurology and refer to neurosurgery.    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  Discussed low dose baby aspirin.    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  Schedule MRI with contrast.  Discussed with neurology and refer to neurosurgery.  Pt agreeable with follow up.      I discussed the assessment and treatment plan with the patient. The patient was provided an opportunity to ask questions and all were answered. The patient agreed with the plan and demonstrated an understanding of the instructions.   The patient was advised to call back or seek an in-person evaluation if the symptoms worsen or if the condition fails to improve as anticipated.   Dale Juncal, MD

## 2018-10-29 NOTE — Assessment & Plan Note (Signed)
Was evaluated in ER with scans as outlined.  Treated for Bell's Palsy.  Discussed CT and MRI results.  Can complete prednisone and valtrex.  Discussed the need for f/u MRI with contrast.  Discussed with neurology and refer to neurosurgery.

## 2018-10-30 ENCOUNTER — Encounter: Payer: Self-pay | Admitting: Internal Medicine

## 2018-10-30 NOTE — Telephone Encounter (Signed)
Called pt and discussed

## 2018-10-31 ENCOUNTER — Telehealth: Payer: Self-pay | Admitting: Internal Medicine

## 2018-10-31 NOTE — Telephone Encounter (Signed)
-----   Message from Ermalene Searing sent at 10/30/2018 12:27 PM EDT ----- Regarding: RE: referral I called her and she said she leaves town this Saturday and doesn't come back until 11/15/18. Dr Lacinda Axon is in surgery the rest of this week and Dr Izora Ribas is out of the office this week. Dr Lacinda Axon reviewed her chart and said it would be ok for her to wait until after her trip if she is not having any symptoms. I told her to call me if her plans change.  Thanks! Ophelia Shoulder, RN ----- Message ----- From: Einar Pheasant, MD Sent: 10/30/2018   8:03 AM EDT To: Ermalene Searing Subject: RE: referral                                   Thank you for the quick response and f/u with me.  I spoke to her this morning.  She informed me that she would be willing to go to [facility] for an appointment if that helped with the scheduling.  I also asked her to see if she could rearrange her schedule for next week.  Thank you again.    Dr Nicki Reaper ----- Message ----- From: Ermalene Searing Sent: 10/29/2018   5:51 PM EDT To: Einar Pheasant, MD Subject: referral                                       Hi Dr Nicki Reaper,  I wanted to let you know that we offered her an appointment for next Tuesday. However, she stated she would be out of town until 11/16/18, so we have her scheduled with Dr Lacinda Axon on 11/19/18.  Thank you for the referral!  Take care,  -Ophelia Shoulder, [place] [facility]

## 2018-10-31 NOTE — Telephone Encounter (Signed)
This has been done. The patient has been scheduled with  Dr. Lacinda Axon.

## 2018-11-01 DIAGNOSIS — D329 Benign neoplasm of meninges, unspecified: Secondary | ICD-10-CM | POA: Diagnosis not present

## 2018-11-01 DIAGNOSIS — G510 Bell's palsy: Secondary | ICD-10-CM | POA: Diagnosis not present

## 2018-11-19 DIAGNOSIS — G939 Disorder of brain, unspecified: Secondary | ICD-10-CM | POA: Diagnosis not present

## 2018-11-19 DIAGNOSIS — D329 Benign neoplasm of meninges, unspecified: Secondary | ICD-10-CM | POA: Diagnosis not present

## 2018-11-28 ENCOUNTER — Encounter: Payer: Self-pay | Admitting: Internal Medicine

## 2018-11-28 DIAGNOSIS — R69 Illness, unspecified: Secondary | ICD-10-CM | POA: Diagnosis not present

## 2018-11-28 NOTE — Telephone Encounter (Signed)
See if she is agreeable to schedule a doxy appt tomorrow.

## 2018-11-28 NOTE — Telephone Encounter (Signed)
Spoke with pt and she has been scheduled for a virtual appt tomorrow at 2pm.

## 2018-11-29 ENCOUNTER — Other Ambulatory Visit: Payer: Self-pay

## 2018-11-29 ENCOUNTER — Ambulatory Visit (INDEPENDENT_AMBULATORY_CARE_PROVIDER_SITE_OTHER): Payer: Medicare HMO | Admitting: Internal Medicine

## 2018-11-29 DIAGNOSIS — R930 Abnormal findings on diagnostic imaging of skull and head, not elsewhere classified: Secondary | ICD-10-CM

## 2018-11-29 NOTE — Progress Notes (Signed)
Virtual Visit via virtual Note  This visit type was conducted due to national recommendations for restrictions regarding the COVID-19 pandemic (e.g. social distancing).  This format is felt to be most appropriate for this patient at this time.  All issues noted in this document were discussed and addressed.  No physical exam was performed (except for noted visual exam findings with Video Visits).   I connected with Sarah Perry by a video enabled telemedicine application or telephone and verified that I am speaking with the correct person using two identifiers. Location patient: home Location provider: work  Persons participating in the virtual visit: patient, provider  I discussed the limitations, risks, security and privacy concerns of performing an evaluation and management service by video and the availability of in person appointments.  The patient expressed understanding and agreed to proceed.  Interactive audio and video telecommunications were attempted between this provider and patient however failed, due to technical difficulties  We continued and completed visit with audio only.   Reason for visit: work in appt.   HPI: Here to discuss recent diagnosis of presumed meningioma.  Has seen neurology and neurosurgery.  Notes reviewed.  She recently met with Dr Lacinda Axon and discussed surveillance imaging versus biopsy and debulking surgery.  She had questions regarding these options.  She currently has no symptoms.  Specifically denies any change in vision.  No double vision.  No weakness, numbness, speech problems.  Overall feels good.  Trying to stay active.  No chest pain.  No sob.  Eating and drinking well.  States her sister has a meningioma - being followed.  She did have labs through [facility].  Increased prolactin.  Has a f/u scheduled with endocrinology.     ROS: See pertinent positives and negatives per HPI.  Past Medical History:  Diagnosis Date  . Basal cell carcinoma   .  Degenerative arthritis    hips, knees  . GERD (gastroesophageal reflux disease)   . History of basal cell carcinoma (BCC)    Multiple BCC's  . Varicella zoster 10/09    Past Surgical History:  Procedure Laterality Date  . BASAL CELL CARCINOMA EXCISION    . DILATION AND CURETTAGE OF UTERUS    . TONSILECTOMY/ADENOIDECTOMY WITH MYRINGOTOMY      Family History  Problem Relation Age of Onset  . Ovarian cancer Mother   . Hypercholesterolemia Mother   . Dementia Father   . Prostate cancer Father   . Breast cancer Paternal Aunt   . Pancreatic cancer Maternal Aunt     SOCIAL HX: reviewed.    Current Outpatient Medications:  .  acyclovir ointment (ZOVIRAX) 5 %, Apply to affected area on lip qid., Disp: 30 g, Rfl: 0 .  calcium-vitamin D (CALCIUM 500+D) 500-400 MG-UNIT per tablet, Take 1 tablet by mouth daily. , Disp: , Rfl:  .  latanoprost (XALATAN) 0.005 % ophthalmic solution, , Disp: , Rfl: 3 .  Multiple Vitamin (MULTIVITAMIN) tablet, Take 1 tablet by mouth daily., Disp: , Rfl:  .  omeprazole (PRILOSEC) 20 MG capsule, Take 1 capsule (20 mg total) by mouth daily., Disp: 90 capsule, Rfl: 3 .  venlafaxine XR (EFFEXOR XR) 75 MG 24 hr capsule, Take 1 capsule (75 mg total) by mouth daily., Disp: 90 capsule, Rfl: 1  EXAM:  GENERAL: alert. Answering questions appropriately.  Sounds to be in no acute distress.    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  Presumed meningioma.  Currently without symptoms.  Discussed options with Dr Lacinda Axon.  He discussed surveillance imaging versus biopsy and debulking surgery.  She elects to repeat MRI in 6 months.  Discussed the need to monitor symptoms.  Any change, she is to be evaluated immediately.  Pt  comfortable with this plan.      I discussed the assessment and treatment plan with the patient. The patient was provided an opportunity to ask questions and all were answered. The patient agreed with the plan and demonstrated an understanding of the instructions.   The patient was advised to call back or seek an in-person evaluation if the symptoms worsen or if the condition fails to improve as anticipated.  I provided 19 minutes of non-face-to-face time during this encounter.   Einar Pheasant, MD

## 2018-12-03 ENCOUNTER — Encounter: Payer: Self-pay | Admitting: Internal Medicine

## 2018-12-03 NOTE — Assessment & Plan Note (Signed)
Has recently seen neurology and [facility].  Presumed meningioma.  Currently without symptoms.  Discussed options with Dr Lacinda Axon.  He discussed surveillance imaging versus biopsy and debulking surgery.  She elects to repeat MRI in 6 months.  Discussed the need to monitor symptoms.  Any change, she is to be evaluated immediately.  Pt comfortable with this plan.

## 2018-12-13 DIAGNOSIS — D352 Benign neoplasm of pituitary gland: Secondary | ICD-10-CM | POA: Diagnosis not present

## 2018-12-13 DIAGNOSIS — R7989 Other specified abnormal findings of blood chemistry: Secondary | ICD-10-CM | POA: Diagnosis not present

## 2018-12-17 DIAGNOSIS — D352 Benign neoplasm of pituitary gland: Secondary | ICD-10-CM | POA: Diagnosis not present

## 2019-01-30 ENCOUNTER — Other Ambulatory Visit (INDEPENDENT_AMBULATORY_CARE_PROVIDER_SITE_OTHER): Payer: Medicare HMO

## 2019-01-30 ENCOUNTER — Other Ambulatory Visit: Payer: Self-pay

## 2019-01-30 DIAGNOSIS — N951 Menopausal and female climacteric states: Secondary | ICD-10-CM | POA: Diagnosis not present

## 2019-01-30 DIAGNOSIS — R739 Hyperglycemia, unspecified: Secondary | ICD-10-CM

## 2019-01-30 DIAGNOSIS — D509 Iron deficiency anemia, unspecified: Secondary | ICD-10-CM

## 2019-01-30 LAB — BASIC METABOLIC PANEL
BUN: 17 mg/dL (ref 6–23)
CO2: 32 mEq/L (ref 19–32)
Calcium: 9.3 mg/dL (ref 8.4–10.5)
Chloride: 98 mEq/L (ref 96–112)
Creatinine, Ser: 0.85 mg/dL (ref 0.40–1.20)
GFR: 66.89 mL/min (ref >60.00)
Glucose, Bld: 94 mg/dL (ref 70–99)
Potassium: 4.1 mEq/L (ref 3.5–5.1)
Sodium: 137 mEq/L (ref 135–145)

## 2019-01-30 LAB — HEPATIC FUNCTION PANEL
ALT: 22 U/L (ref 0–35)
AST: 29 U/L (ref 0–37)
Albumin: 4.4 g/dL (ref 3.5–5.2)
Alkaline Phosphatase: 96 U/L (ref 39–117)
Bilirubin, Direct: 0.1 mg/dL (ref 0.0–0.3)
Total Bilirubin: 0.6 mg/dL (ref 0.2–1.2)
Total Protein: 6.9 g/dL (ref 6.0–8.3)

## 2019-01-30 LAB — CBC WITH DIFFERENTIAL/PLATELET
Basophils Absolute: 0 10*3/uL (ref 0.0–0.1)
Basophils Relative: 0.9 % (ref 0.0–3.0)
Eosinophils Absolute: 0.1 10*3/uL (ref 0.0–0.7)
Eosinophils Relative: 1.4 % (ref 0.0–5.0)
HCT: 35.1 % — ABNORMAL LOW (ref 36.0–46.0)
Hemoglobin: 12.1 g/dL (ref 12.0–15.0)
Lymphocytes Relative: 23 % (ref 12.0–46.0)
Lymphs Abs: 1.3 10*3/uL (ref 0.7–4.0)
MCHC: 34.5 g/dL (ref 30.0–36.0)
MCV: 91.1 fl (ref 78.0–100.0)
Monocytes Absolute: 0.6 10*3/uL (ref 0.1–1.0)
Monocytes Relative: 11.4 % (ref 3.0–12.0)
Neutro Abs: 3.5 10*3/uL (ref 1.4–7.7)
Neutrophils Relative %: 63.3 % (ref 43.0–77.0)
Platelets: 230 10*3/uL (ref 150.0–400.0)
RBC: 3.85 Mil/uL — ABNORMAL LOW (ref 3.87–5.11)
RDW: 13.4 % (ref 11.5–15.5)
WBC: 5.6 10*3/uL (ref 4.0–10.5)

## 2019-01-30 LAB — TSH: TSH: 2.98 u[IU]/mL (ref 0.35–4.50)

## 2019-01-30 LAB — LIPID PANEL
Cholesterol: 197 mg/dL (ref 0–200)
HDL: 81.5 mg/dL (ref >39.00)
LDL Cholesterol: 102 mg/dL — ABNORMAL HIGH (ref 0–99)
NonHDL: 115.5
Total CHOL/HDL Ratio: 2
Triglycerides: 67 mg/dL (ref 0.0–149.0)
VLDL: 13.4 mg/dL (ref 0.0–40.0)

## 2019-01-30 LAB — HEMOGLOBIN A1C: Hgb A1c MFr Bld: 5.7 % (ref 4.6–6.5)

## 2019-01-30 LAB — FERRITIN: Ferritin: 78.7 ng/mL (ref 10.0–291.0)

## 2019-02-01 ENCOUNTER — Encounter: Payer: Self-pay | Admitting: Internal Medicine

## 2019-02-03 ENCOUNTER — Other Ambulatory Visit: Payer: Self-pay

## 2019-02-03 ENCOUNTER — Encounter: Payer: Self-pay | Admitting: Internal Medicine

## 2019-02-03 ENCOUNTER — Ambulatory Visit (INDEPENDENT_AMBULATORY_CARE_PROVIDER_SITE_OTHER): Payer: Medicare HMO | Admitting: Internal Medicine

## 2019-02-03 VITALS — BP 118/72 | HR 74 | Temp 96.4°F | Resp 16 | Ht 71.0 in | Wt 154.0 lb

## 2019-02-03 DIAGNOSIS — Z23 Encounter for immunization: Secondary | ICD-10-CM

## 2019-02-03 DIAGNOSIS — K219 Gastro-esophageal reflux disease without esophagitis: Secondary | ICD-10-CM

## 2019-02-03 DIAGNOSIS — G510 Bell's palsy: Secondary | ICD-10-CM

## 2019-02-03 DIAGNOSIS — I6381 Other cerebral infarction due to occlusion or stenosis of small artery: Secondary | ICD-10-CM | POA: Diagnosis not present

## 2019-02-03 DIAGNOSIS — D329 Benign neoplasm of meninges, unspecified: Secondary | ICD-10-CM

## 2019-02-03 DIAGNOSIS — R739 Hyperglycemia, unspecified: Secondary | ICD-10-CM | POA: Diagnosis not present

## 2019-02-03 DIAGNOSIS — Z0000 Encounter for general adult medical examination without abnormal findings: Secondary | ICD-10-CM | POA: Diagnosis not present

## 2019-02-03 DIAGNOSIS — D509 Iron deficiency anemia, unspecified: Secondary | ICD-10-CM | POA: Diagnosis not present

## 2019-02-03 NOTE — Progress Notes (Signed)
Patient ID: Sarah Perry, female   DOB: 1950/10/01, 68 y.o.   MRN: [id number]   Subjective:    Patient ID: Sarah Perry, female    DOB: 1950/07/20, 68 y.o.   MRN: [id number]  HPI  Patient here for her physical exam.  She reports she is doing relatively well.  Recently evaluated in ER 10/24/18 with left facial droop. Diagnosed with Bell's Palsy.  Work up included MRI and she was found to have inflitrating extra-axial meningioma involving the sella, right cavernous sinus and clivus and minimal white matter microvascular ischemic changes.  Was treated with prednisone and valtrex.  Bell's palsy - facial droop - resolved.  No facial droop now.  Feels back to nnormal.  Saw neurosurgery.  After discussion, elected to repeat MRI in 6 months.  States she is doing well.  Denies any vision change.  No sensation change - face.  No headache.  No dizziness or light headedness.  No chest pain.  Stays active.  No sob.  No acid reflux.  No abdominal pain. Bowels moving.  Overall handling stress well.  Discussed labs.     Past Medical History:  Diagnosis Date   Basal cell carcinoma    Degenerative arthritis    hips, knees   GERD (gastroesophageal reflux disease)    History of basal cell carcinoma (BCC)    Multiple BCC's   Varicella zoster 10/09   Past Surgical History:  Procedure Laterality Date   BASAL CELL CARCINOMA EXCISION     DILATION AND CURETTAGE OF UTERUS     TONSILECTOMY/ADENOIDECTOMY WITH MYRINGOTOMY     Family History  Problem Relation Age of Onset   Ovarian cancer Mother    Hypercholesterolemia Mother    Dementia Father    Prostate cancer Father    Breast cancer Paternal Aunt    Pancreatic cancer Maternal Aunt    Social History   Socioeconomic History   Marital status: Married    Spouse name: Not on file   Number of children: 2   Years of education: Not on file   Highest education level: Not on file  Occupational History   Not on file  Social Needs    Financial resource strain: Not on file   Food insecurity    Worry: Not on file    Inability: Not on file   Transportation needs    Medical: Not on file    Non-medical: Not on file  Tobacco Use   Smoking status: Never Smoker   Smokeless tobacco: Never Used  Substance and Sexual Activity   Alcohol use: Yes    Alcohol/week: 0.0 standard drinks    Comment: glass of wine daily   Drug use: No   Sexual activity: Not on file  Lifestyle   Physical activity    Days per week: Not on file    Minutes per session: Not on file   Stress: Not on file  Relationships   Social connections    Talks on phone: Not on file    Gets together: Not on file    Attends religious service: Not on file    Active member of club or organization: Not on file    Attends meetings of clubs or organizations: Not on file    Relationship status: Not on file  Other Topics Concern   Not on file  Social History Narrative   Not on file    Outpatient Encounter Medications as of 02/03/2019  Medication Sig   acyclovir ointment (ZOVIRAX)  5 % Apply to affected area on lip qid.   aspirin 81 MG chewable tablet Chew 1 tablet by mouth daily.   calcium-vitamin D (CALCIUM 500+D) 500-400 MG-UNIT per tablet Take 1 tablet by mouth daily.    latanoprost (XALATAN) 0.005 % ophthalmic solution    Multiple Vitamin (MULTIVITAMIN) tablet Take 1 tablet by mouth daily.   omeprazole (PRILOSEC) 20 MG capsule Take 1 capsule (20 mg total) by mouth daily.   venlafaxine XR (EFFEXOR XR) 75 MG 24 hr capsule Take 1 capsule (75 mg total) by mouth daily.   No facility-administered encounter medications on file as of 02/03/2019.    Review of Systems  Constitutional: Negative for appetite change and unexpected weight change.  HENT: Negative for congestion and sinus pressure.   Eyes: Negative for pain and visual disturbance.  Respiratory: Negative for cough, chest tightness and shortness of breath.   Cardiovascular: Negative  for chest pain, palpitations and leg swelling.  Gastrointestinal: Negative for abdominal pain, diarrhea, nausea and vomiting.  Genitourinary: Negative for difficulty urinating and dysuria.  Musculoskeletal: Negative for joint swelling and myalgias.  Skin: Negative for color change and rash.  Neurological: Negative for dizziness, light-headedness and headaches.  Hematological: Negative for adenopathy. Does not bruise/bleed easily.  Psychiatric/Behavioral: Negative for agitation and dysphoric mood.       Objective:    Physical Exam Constitutional:      General: She is not in acute distress.    Appearance: Normal appearance. She is well-developed.  HENT:     Right Ear: External ear normal.     Left Ear: External ear normal.  Eyes:     General: No scleral icterus.       Right eye: No discharge.        Left eye: No discharge.     Conjunctiva/sclera: Conjunctivae normal.  Neck:     Musculoskeletal: Neck supple. No muscular tenderness.     Thyroid: No thyromegaly.  Cardiovascular:     Rate and Rhythm: Normal rate and regular rhythm.  Pulmonary:     Effort: No tachypnea, accessory muscle usage or respiratory distress.     Breath sounds: Normal breath sounds. No decreased breath sounds or wheezing.  Chest:     Breasts:        Right: No inverted nipple, mass, nipple discharge or tenderness (no axillary adenopathy).        Left: No inverted nipple, mass, nipple discharge or tenderness (no axilarry adenopathy).  Abdominal:     General: Bowel sounds are normal.     Palpations: Abdomen is soft.     Tenderness: There is no abdominal tenderness.  Musculoskeletal:        General: No swelling or tenderness.  Lymphadenopathy:     Cervical: No cervical adenopathy.  Skin:    Findings: No erythema or rash.  Neurological:     Mental Status: She is alert and oriented to person, place, and time.  Psychiatric:        Mood and Affect: Mood normal.        Behavior: Behavior normal.     BP  118/72    Pulse 74    Temp (!) 96.4 F (35.8 C)    Resp 16    Ht 5\' 11"  (1.803 m)    Wt 154 lb (69.9 kg)    SpO2 99%    BMI 21.48 kg/m  Wt Readings from Last 3 Encounters:  02/03/19 154 lb (69.9 kg)  10/24/18 157 lb (71.2 kg)  03/01/18 157 lb 9.6 oz (71.5 kg)     Lab Results  Component Value Date   WBC 5.6 01/30/2019   HGB 12.1 01/30/2019   HCT 35.1 (L) 01/30/2019   PLT 230.0 01/30/2019   GLUCOSE 94 01/30/2019   CHOL 197 01/30/2019   TRIG 67.0 01/30/2019   HDL 81.50 01/30/2019   LDLCALC 102 (H) 01/30/2019   ALT 22 01/30/2019   AST 29 01/30/2019   NA 137 01/30/2019   K 4.1 01/30/2019   CL 98 01/30/2019   CREATININE 0.85 01/30/2019   BUN 17 01/30/2019   CO2 32 01/30/2019   TSH 2.98 01/30/2019   INR 0.9 10/24/2018   HGBA1C 5.7 01/30/2019    Mr Brain W Contrast  Result Date: 10/29/2018 CLINICAL DATA:  Follow-up sellar, suprasellar and infiltrating skull base mass. EXAM: MRI HEAD WITH CONTRAST TECHNIQUE: Multiplanar, multiecho pulse sequences of the brain and surrounding structures were obtained with intravenous contrast. CONTRAST:  7 cc Gadavist COMPARISON:  10/24/2018 FINDINGS: Extensive infiltrating and spreading meningioma with the epicenter in the sellar and suprasellar region. The lesion measures approximately 2 x 3.5 x 3.5 cm in total. Enhancing tissue fills the sella, without specifically visible pituitary tissue being identifiable. Suprasellar extension, in direct contact with the under surface of the optic chiasm to the right of midline. Cavernous sinus infiltration on the right with complete encasement of the carotid artery. Cavernous sinus involvement to a lesser extent on the left with partial encasement of the cavernous carotid. Tumor is present at both orbital apices, more extensive on the right than the left. Dural tails are present along the planum sphenoidale, along the dorsal surface of the clivus, along the medial aspect of the medial fossa on the right and along  the right tentorium. Nodular focus of meningioma tumor along the planum sphenoidale to the right of midline measuring 9 mm in size. Tumor shows infiltration of the bone at the skull base and there is a component of meningioma tumor bulging into the right division the sphenoid sinus. IMPRESSION: Extensive infiltrating and spreading meningioma of the skull base region with the epicenter in the sellar and suprasellar region with full discussion as above. Electronically Signed   By: Paulina Fusi M.D.   On: 10/29/2018 17:46       Assessment & Plan:   Problem List Items Addressed This Visit    Anemia    hgb normal 01/30/19.  Follow cbc.       Bell's palsy    Resolved.  No facial droop.        GERD (gastroesophageal reflux disease)    Controlled on current regimen.  Follow.        Health care maintenance    Physical today 02/03/19.  PAP 06/26/17 - negative with negative HPV.  Mammogram 09/13/18 - Birads I.  Colonoscopy 2012. Pt reports f/u due in 10 years after colonoscopy.  Hemoccult cards given.        Hyperglycemia    Low carb diet and exercise.  a1c 5.7.  Follow met b and a1c.        Lacunar infarction (HCC)    Found on MRI - previous infarct.  On 81mg  aspirin.  Follow.        Meningioma [facility])    Recently evaluated by neurosurgery as outlined.  No facial droop or sensation change now.  After discussion, elected 6 month f/u MRI.  Continue f/u with neurosurgery.         Other Visit Diagnoses  Routine general medical examination at a health care facility    -  Primary   Need for 23-polyvalent pneumococcal polysaccharide vaccine       Relevant Orders   Pneumococcal polysaccharide vaccine 23-valent greater than or equal to 2yo subcutaneous/IM (Completed)       Dale [place], MD

## 2019-02-03 NOTE — Assessment & Plan Note (Signed)
Physical today 02/03/19.  PAP 06/26/17 - negative with negative HPV.  Mammogram 09/13/18 - Birads I.  Colonoscopy 2012. Pt reports f/u due in 10 years after colonoscopy.  Hemoccult cards given.

## 2019-02-09 ENCOUNTER — Encounter: Payer: Self-pay | Admitting: Internal Medicine

## 2019-02-09 NOTE — Assessment & Plan Note (Signed)
Resolved.  No facial droop.

## 2019-02-09 NOTE — Assessment & Plan Note (Signed)
hgb normal 01/30/19.  Follow cbc.

## 2019-02-09 NOTE — Assessment & Plan Note (Signed)
Found on MRI - previous infarct.  On 81mg  aspirin.  Follow.

## 2019-02-09 NOTE — Assessment & Plan Note (Signed)
Recently evaluated by neurosurgery as outlined.  No facial droop or sensation change now.  After discussion, elected 6 month f/u MRI.  Continue f/u with neurosurgery.

## 2019-02-09 NOTE — Assessment & Plan Note (Signed)
Low carb diet and exercise.  a1c 5.7.  Follow met b and a1c.

## 2019-02-09 NOTE — Assessment & Plan Note (Signed)
Controlled on current regimen.  Follow.  

## 2019-02-18 DIAGNOSIS — L578 Other skin changes due to chronic exposure to nonionizing radiation: Secondary | ICD-10-CM | POA: Diagnosis not present

## 2019-02-18 DIAGNOSIS — L821 Other seborrheic keratosis: Secondary | ICD-10-CM | POA: Diagnosis not present

## 2019-02-18 DIAGNOSIS — D485 Neoplasm of uncertain behavior of skin: Secondary | ICD-10-CM | POA: Diagnosis not present

## 2019-02-18 DIAGNOSIS — L738 Other specified follicular disorders: Secondary | ICD-10-CM | POA: Diagnosis not present

## 2019-02-18 DIAGNOSIS — Z1283 Encounter for screening for malignant neoplasm of skin: Secondary | ICD-10-CM | POA: Diagnosis not present

## 2019-02-18 DIAGNOSIS — L814 Other melanin hyperpigmentation: Secondary | ICD-10-CM | POA: Diagnosis not present

## 2019-02-18 DIAGNOSIS — D1800 Hemangioma unspecified site: Secondary | ICD-10-CM | POA: Diagnosis not present

## 2019-02-18 DIAGNOSIS — C4441 Basal cell carcinoma of skin of scalp and neck: Secondary | ICD-10-CM | POA: Diagnosis not present

## 2019-02-28 DIAGNOSIS — D485 Neoplasm of uncertain behavior of skin: Secondary | ICD-10-CM | POA: Diagnosis not present

## 2019-02-28 DIAGNOSIS — C44311 Basal cell carcinoma of skin of nose: Secondary | ICD-10-CM | POA: Diagnosis not present

## 2019-02-28 DIAGNOSIS — C4441 Basal cell carcinoma of skin of scalp and neck: Secondary | ICD-10-CM | POA: Diagnosis not present

## 2019-02-28 DIAGNOSIS — I781 Nevus, non-neoplastic: Secondary | ICD-10-CM | POA: Diagnosis not present

## 2019-03-11 ENCOUNTER — Other Ambulatory Visit: Payer: Self-pay

## 2019-03-11 ENCOUNTER — Ambulatory Visit (INDEPENDENT_AMBULATORY_CARE_PROVIDER_SITE_OTHER): Payer: Medicare HMO

## 2019-03-11 DIAGNOSIS — Z0000 Encounter for general adult medical examination without abnormal findings: Secondary | ICD-10-CM

## 2019-03-11 DIAGNOSIS — Z760 Encounter for issue of repeat prescription: Secondary | ICD-10-CM | POA: Diagnosis not present

## 2019-03-11 NOTE — Progress Notes (Signed)
Subjective:   Sarah Perry is a 68 y.o. female who presents for an Initial Medicare Annual Wellness Visit.  Review of Systems    No ROS.  Medicare Wellness Virtual Visit.  Visual/audio telehealth visit, UTA vital signs.   See social history for additional risk factors.    Cardiac Risk Factors include: advanced age (>16mn, >>48women)     Objective:    Today's Vitals   There is no height or weight on file to calculate BMI.  Advanced Directives 03/11/2019 10/24/2018  Does Patient Have a Medical Advance Directive? No No  Would patient like information on creating a medical advance directive? No - Patient declined No - Patient declined    Current Medications (verified) Outpatient Encounter Medications as of 03/11/2019  Medication Sig  . acyclovir ointment (ZOVIRAX) 5 % Apply to affected area on lip qid.  .Marland Kitchenaspirin 81 MG chewable tablet Chew 1 tablet by mouth daily.  . calcium-vitamin D (CALCIUM 500+D) 500-400 MG-UNIT per tablet Take 1 tablet by mouth daily.   .Marland Kitchenlatanoprost (XALATAN) 0.005 % ophthalmic solution   . Multiple Vitamin (MULTIVITAMIN) tablet Take 1 tablet by mouth daily.  .Marland Kitchenomeprazole (PRILOSEC) 20 MG capsule Take 1 capsule (20 mg total) by mouth daily.  .Marland Kitchenvenlafaxine XR (EFFEXOR XR) 75 MG 24 hr capsule Take 1 capsule (75 mg total) by mouth daily.   No facility-administered encounter medications on file as of 03/11/2019.     Allergies (verified) Penicillins   History: Past Medical History:  Diagnosis Date  . Basal cell carcinoma   . Degenerative arthritis    hips, knees  . GERD (gastroesophageal reflux disease)   . History of basal cell carcinoma (BCC)    Multiple BCC's  . Varicella zoster 10/09   Past Surgical History:  Procedure Laterality Date  . BASAL CELL CARCINOMA EXCISION    . DILATION AND CURETTAGE OF UTERUS    . TONSILECTOMY/ADENOIDECTOMY WITH MYRINGOTOMY     Family History  Problem Relation Age of Onset  . Ovarian cancer Mother   .  Hypercholesterolemia Mother   . Dementia Father   . Prostate cancer Father   . Breast cancer Paternal Aunt   . Pancreatic cancer Maternal Aunt    Social History   Socioeconomic History  . Marital status: Married    Spouse name: Not on file  . Number of children: 2  . Years of education: Not on file  . Highest education level: Not on file  Occupational History  . Not on file  Social Needs  . Financial resource strain: Not hard at all  . Food insecurity    Worry: Never true    Inability: Never true  . Transportation needs    Medical: No    Non-medical: No  Tobacco Use  . Smoking status: Never Smoker  . Smokeless tobacco: Never Used  Substance and Sexual Activity  . Alcohol use: Yes    Alcohol/week: 0.0 standard drinks    Comment: glass of wine daily  . Drug use: No  . Sexual activity: Not on file  Lifestyle  . Physical activity    Days per week: 5 days    Minutes per session: 30 min  . Stress: Not at all  Relationships  . Social [place] phone: Not on file    Gets together: Not on file    Attends religious service: Not on file    Active member of club or organization: Not on file  Attends meetings of clubs or organizations: Not on file    Relationship status: Not on file  Other Topics Concern  . Not on file  Social History Narrative  . Not on file    Tobacco Counseling Counseling given: Not Answered   Clinical Intake:  Pre-visit preparation completed: Yes           How often do you need to have someone help you when you read instructions, pamphlets, or other written materials from your doctor or pharmacy?: 1 - Never  Interpreter Needed?: No      Activities of Daily Living In your present state of health, do you have any difficulty performing the following activities: 03/11/2019  Hearing? N  Vision? N  Difficulty concentrating or making decisions? N  Walking or climbing stairs? N  Dressing or bathing? N  Doing errands,  shopping? N  Preparing Food and eating ? N  Using the Toilet? N  In the past six months, have you accidently leaked urine? N  Do you have problems with loss of bowel control? N  Managing your Medications? N  Managing your Finances? N  Housekeeping or managing your Housekeeping? N  Some recent data might be hidden     Immunizations and Health Maintenance Immunization History  Administered Date(s) Administered  . Influenza Split 01/06/2013, 12/30/2013, 01/05/2015  . Influenza, High Dose Seasonal PF 11/28/2018  . Influenza,inj,Quad PF,6+ Mos 12/12/2017  . Pneumococcal Conjugate-13 12/31/2017  . Pneumococcal Polysaccharide-23 02/03/2019  . Zoster 03/12/2013  . Zoster Recombinat (Shingrix) 01/02/2018, 03/20/2018   Health Maintenance Due  Topic Date Due  . Samul Dada  12/19/1969    Patient Care Team: Einar Pheasant, MD as PCP - General (Internal Medicine)  Indicate any recent Medical Services you may have received from other than [facility] providers in the past year (date may be approximate).     Assessment:   This is a routine wellness examination for Sarah Perry.  Nurse connected with patient 03/11/19 at  9:00 AM EST by a telephone enabled telemedicine application and verified that I am speaking with the correct person using two identifiers. Patient stated full name and DOB. Patient gave permission to continue with virtual visit. Patient's location was at home and Nurse's location was at [place] office.   Health Maintenance Due: -Tdap- discussed; to be completed with doctor in visit or local pharmacy.   Update all pending maintenance due as appropriate.   See completed HM at the end of note.   Eye: Visual acuity not assessed. Virtual visit. Wears corrective lenses. Followed by their ophthalmologist every 12 months.   Dental: Visits every 12 months.    Hearing: Demonstrates normal hearing during visit.  Safety:  Patient feels safe at home- yes Patient does have smoke  detectors at home- yes Patient does wear sunscreen or protective clothing when in direct sunlight - yes Patient does wear seat belt when in a moving vehicle - yes Patient drives- yes Adequate lighting in walkways free from debris- yes Grab bars and handrails used as appropriate- yes Ambulates with no assistive device Cell phone on person when ambulating outside of the home- yes  Social: Alcohol intake - yes      Smoking history- never   Smokers in home? none Illicit drug use? none  Depression: PHQ 2 &9 complete. See screening below. Denies irritability, anhedonia, sadness/tearfullness.  Stable.   Falls: See screening below.    Medication: Taking as directed and without issues. Refill request Effexor. Last seen 02/03/19 cpe. Medication pended  for approval.   Covid-19: Precautions and sickness symptoms discussed. Wears mask, social distancing, hand hygiene as appropriate.   Activities of Daily Living Patient denies needing assistance with: household chores, feeding themselves, getting from bed to chair, getting to the toilet, bathing/showering, dressing, managing money, or preparing meals.   Memory: Patient is alert. Patient denies difficulty focusing or concentrating. Correctly identified the president of the [place], season and recall. Patient likes to read and play sodoku for brain stimulation.   BMI- discussed the importance of a healthy diet, water intake and the benefits of aerobic exercise.  Educational material provided.  Physical activity- briskly walks daily for about 30 minutes, silver sneaker online program.   Diet:  Regular Water: fair intake  Other Providers Patient Care Team: Einar Pheasant, MD as PCP - General (Internal Medicine)  Hearing/Vision screen  Hearing Screening   _0  _1  _2  _3  _4  _5  _6  _7  _8   Right ear:           Left ear:           Comments: Patient is able to hear conversational tones without difficulty.  No  issues reported.  Vision Screening Comments: Wears corrective lenses Visual acuity not assessed, virtual visit.  They have seen their ophthalmologist in the last 12 months.     Dietary issues and exercise activities discussed: Current Exercise Habits: Home exercise routine, Type of exercise: walking, Time (Minutes): 30, Frequency (Times/Week): 5, Weekly Exercise (Minutes/Week): 150, Intensity: Intense  Goals    . DIET - INCREASE WATER INTAKE     Stay hydrated      Depression Screen PHQ 2/9 Scores 03/11/2019 09/12/2018 10/03/2016 03/30/2015  PHQ - 2 Score 0 0 0 0  PHQ- 9 Score - - 0 -    Fall Risk Fall Risk  03/11/2019 09/12/2018 03/30/2015  Falls in the past year? 0 0 No  Follow up Falls prevention discussed;Education provided - -   Timed Get Up and Go Performed no, virtual visit  Cognitive Function:     6CIT Screen 03/11/2019  What Year? 0 points  What month? 0 points  What time? 0 points  Count back from 20 0 points  Months in reverse 0 points  Repeat phrase 0 points  Total Score 0    Screening Tests Health Maintenance  Topic Date Due  . TETANUS/TDAP  12/19/1971  . COLONOSCOPY  12/07/2019  . MAMMOGRAM  09/12/2020  . INFLUENZA VACCINE  Completed  . DEXA SCAN  Completed  . Hepatitis C Screening  Completed  . PNA vac Low Risk Adult  Completed     Plan:   Keep all routine maintenance appointments.   Follow up 08/05/19  Awaiting IFOB results from kit given 02/03/19. Notes kit was mailed in to address provided once completed.  Medicare Attestation I have personally reviewed: The patient's medical and social history Their use of alcohol, tobacco or illicit drugs Their current medications and supplements The patient's functional ability including ADLs,fall risks, home safety risks, cognitive, and hearing and visual impairment Diet and physical activities Evidence for depression   In addition, I have reviewed and discussed with patient certain preventive  protocols, quality metrics, and best practice recommendations. A written personalized care plan for preventive services as well as general preventive health recommendations were provided to patient via mail.     Varney Biles, LPN   16/04/958    Reviewed above information.  Agree with assessment and plan.  effexor refilled.    Dr Nicki Reaper

## 2019-03-11 NOTE — Patient Instructions (Addendum)
  Sarah Perry , Thank you for taking time to come for your Medicare Wellness Visit. I appreciate your ongoing commitment to your health goals. Please review the following plan we discussed and let me know if I can assist you in the future.   These are the goals we discussed: Goals    . DIET - INCREASE WATER INTAKE     Stay hydrated       This is a list of the screening recommended for you and due dates:  Health Maintenance  Topic Date Due  . Tetanus Vaccine  12/19/1971  . Colon Cancer Screening  12/07/2019  . Mammogram  09/12/2020  . Flu Shot  Completed  . DEXA scan (bone density measurement)  Completed  .  Hepatitis C: One time screening is recommended by Center for Disease Control  (CDC) for  adults born from [birth year] through 1963.   Completed  . Pneumonia vaccines  Completed

## 2019-03-13 DIAGNOSIS — H40053 Ocular hypertension, bilateral: Secondary | ICD-10-CM | POA: Diagnosis not present

## 2019-03-13 MED ORDER — VENLAFAXINE HCL ER 75 MG PO CP24: 75.0000 mg | ORAL_CAPSULE | Freq: Every day | ORAL | 1 refills | Status: DC

## 2019-03-20 ENCOUNTER — Encounter: Payer: Self-pay | Admitting: Internal Medicine

## 2019-03-22 ENCOUNTER — Other Ambulatory Visit: Payer: Self-pay

## 2019-03-22 ENCOUNTER — Encounter: Payer: Self-pay | Admitting: Emergency Medicine

## 2019-03-22 ENCOUNTER — Emergency Department
Admission: EM | Admit: 2019-03-22 | Discharge: 2019-03-22 | Disposition: A | Discharge status: Home / Self Care | Payer: Medicare HMO | Attending: Emergency Medicine | Admitting: Emergency Medicine

## 2019-03-22 DIAGNOSIS — M5412 Radiculopathy, cervical region: Secondary | ICD-10-CM | POA: Insufficient documentation

## 2019-03-22 DIAGNOSIS — Z79899 Other long term (current) drug therapy: Secondary | ICD-10-CM | POA: Insufficient documentation

## 2019-03-22 DIAGNOSIS — Z85828 Personal history of other malignant neoplasm of skin: Secondary | ICD-10-CM | POA: Insufficient documentation

## 2019-03-22 DIAGNOSIS — Z7982 Long term (current) use of aspirin: Secondary | ICD-10-CM | POA: Diagnosis not present

## 2019-03-22 DIAGNOSIS — R001 Bradycardia, unspecified: Secondary | ICD-10-CM | POA: Diagnosis not present

## 2019-03-22 DIAGNOSIS — R202 Paresthesia of skin: Secondary | ICD-10-CM | POA: Diagnosis present

## 2019-03-22 LAB — CBC
HCT: 37 % (ref 36.0–46.0)
Hemoglobin: 12.6 g/dL (ref 12.0–15.0)
MCH: 30.4 pg (ref 26.0–34.0)
MCHC: 34.1 g/dL (ref 30.0–36.0)
MCV: 89.2 fL (ref 80.0–100.0)
Platelets: 216 10*3/uL (ref 150–400)
RBC: 4.15 MIL/uL (ref 3.87–5.11)
RDW: 12.5 % (ref 11.5–15.5)
WBC: 5 10*3/uL (ref 4.0–10.5)
nRBC: 0 % (ref 0.0–0.2)

## 2019-03-22 LAB — URINALYSIS, COMPLETE (UACMP) WITH MICROSCOPIC
Bacteria, UA: NONE SEEN
Bilirubin Urine: NEGATIVE
Glucose, UA: NEGATIVE mg/dL
Hgb urine dipstick: NEGATIVE
Ketones, ur: NEGATIVE mg/dL
Leukocytes,Ua: NEGATIVE
Nitrite: NEGATIVE
Protein, ur: NEGATIVE mg/dL
Specific Gravity, Urine: 1.008 (ref 1.005–1.030)
WBC, UA: NONE SEEN WBC/hpf (ref 0–5)
pH: 7 (ref 5.0–8.0)

## 2019-03-22 LAB — BASIC METABOLIC PANEL
Anion gap: 9 (ref 5–15)
BUN: 18 mg/dL (ref 8–23)
CO2: 28 mmol/L (ref 22–32)
Calcium: 9.3 mg/dL (ref 8.9–10.3)
Chloride: 100 mmol/L (ref 98–111)
Creatinine, Ser: 0.65 mg/dL (ref 0.44–1.00)
GFR calc Af Amer: >60 mL/min (ref >60)
GFR calc non Af Amer: >60 mL/min (ref >60)
Glucose, Bld: 110 mg/dL — ABNORMAL HIGH (ref 70–99)
Potassium: 4.1 mmol/L (ref 3.5–5.1)
Sodium: 137 mmol/L (ref 135–145)

## 2019-03-22 MED ORDER — METHOCARBAMOL 500 MG PO TABS: 500.0000 mg | ORAL_TABLET | Freq: Four times a day (QID) | ORAL | 0 refills | Status: DC

## 2019-03-22 MED ORDER — SODIUM CHLORIDE 0.9% FLUSH: 3.0000 mL | Freq: Once | INTRAVENOUS | Status: DC

## 2019-03-22 MED ORDER — MELOXICAM 7.5 MG PO TABS: 7.5000 mg | ORAL_TABLET | Freq: Every day | ORAL | 0 refills | Status: DC

## 2019-03-22 NOTE — ED Triage Notes (Signed)
Pt via pov from home with right neck and arm pain. Pt states yesterday it was her left arm, but that subsided, moving to her right side today. Pt denies any chest pain or sob. Pt alert & oriented; nad note. Pt has benign meningioma, which was diagnosed in July. Pt alert & oriented; nad noted.

## 2019-03-22 NOTE — ED Provider Notes (Signed)
[facility] Emergency Department Provider Note  ____________________________________________  Time seen: Approximately 3:44 PM  I have reviewed the triage vital signs and the nursing notes.   HISTORY  Chief Complaint Arm Pain    HPI Sarah Perry is a 68 y.o. female who presents the emergency department complaining of pain, numbness and tingling radiating down both arms.  Patient reports that she first had a numb and tingling sensation in her left arm yesterday.  This resolved without any medication or treatments.  Patient states that she developed pain similar to the left side down her right arm starting this morning.  She has had intermittent symptoms in her left arm today but has ongoing symptoms in her right.  She does have some mild neck pain/stiffness but has full range of motion to her neck at this time.  Patient denies any headache, visual changes, chest pain, shortness of breath, domino pain, nausea or vomiting.  Patient has medical history as described below with no complications with chronic medical problems.         Past Medical History:  Diagnosis Date  . Basal cell carcinoma   . Degenerative arthritis    hips, knees  . GERD (gastroesophageal reflux disease)   . History of basal cell carcinoma (BCC)    Multiple BCC's  . Varicella zoster 10/09    Patient Active Problem List   Diagnosis Date Noted  . Bell's palsy 10/29/2018  . Lacunar infarction ([place]) 10/29/2018  . Meningioma ([place]) 10/29/2018  . Anemia 12/31/2017  . Hyperglycemia 07/01/2017  . History of syncope 10/05/2016  . Cough 05/18/2016  . Health care maintenance 03/30/2015  . GERD (gastroesophageal reflux disease) 03/29/2012  . Menopausal syndrome 03/29/2012  . Degenerative arthritis 03/29/2012    Past Surgical History:  Procedure Laterality Date  . BASAL CELL CARCINOMA EXCISION    . DILATION AND CURETTAGE OF UTERUS    . TONSILECTOMY/ADENOIDECTOMY WITH MYRINGOTOMY       Prior to Admission medications   Medication Sig Start Date End Date Taking? Authorizing Provider  acyclovir ointment (ZOVIRAX) 5 % Apply to affected area on lip qid. 06/26/17   Einar Pheasant, MD  aspirin 81 MG chewable tablet Chew 1 tablet by mouth daily.    [provider]  calcium-vitamin D (CALCIUM 500+D) 500-400 MG-UNIT per tablet Take 1 tablet by mouth daily.     [provider]  latanoprost (XALATAN) 0.005 % ophthalmic solution  06/29/16   [provider]  meloxicam (MOBIC) 7.5 MG tablet Take 1 tablet (7.5 mg total) by mouth daily. 03/22/19 03/21/20  Lizzette Carbonell, Charline Bills, PA-C  methocarbamol (ROBAXIN) 500 MG tablet Take 1 tablet (500 mg total) by mouth 4 (four) times daily. 03/22/19   Briza Bark, Charline Bills, PA-C  Multiple Vitamin (MULTIVITAMIN) tablet Take 1 tablet by mouth daily.    [provider]  omeprazole (PRILOSEC) 20 MG capsule Take 1 capsule (20 mg total) by mouth daily. 07/01/18   Einar Pheasant, MD  venlafaxine XR (EFFEXOR XR) 75 MG 24 hr capsule Take 1 capsule (75 mg total) by mouth daily. 03/13/19   Einar Pheasant, MD    Allergies Penicillins  Family History  Problem Relation Age of Onset  . Ovarian cancer Mother   . Hypercholesterolemia Mother   . Dementia Father   . Prostate cancer Father   . Breast cancer Paternal Aunt   . Pancreatic cancer Maternal Aunt     Social History Social History   Tobacco Use  . Smoking  status: Never Smoker  . Smokeless tobacco: Never Used  Substance Use Topics  . Alcohol use: Yes    Alcohol/week: 0.0 standard drinks    Comment: glass of wine daily  . Drug use: No     Review of Systems  Constitutional: No fever/chills Eyes: No visual changes. No discharge ENT: No upper respiratory complaints. Cardiovascular: no chest pain. Respiratory: no cough. No SOB. Gastrointestinal: No abdominal pain.  No nausea, no vomiting.  No diarrhea.  No constipation. Musculoskeletal: Right arm  pain/numbness/tingling, intermittent left arm symptoms Skin: Negative for rash, abrasions, lacerations, ecchymosis. Neurological: Negative for headaches, focal weakness or numbness. 10-point ROS otherwise negative.  ____________________________________________   PHYSICAL EXAM:  VITAL SIGNS: ED Triage Vitals  Enc Vitals Group     BP 03/22/19 1453 (!) 150/95     Pulse Rate 03/22/19 1453 71     Resp 03/22/19 1453 18     Temp 03/22/19 1453 98.9 F (37.2 C)     Temp Source 03/22/19 1453 Oral     SpO2 03/22/19 1453 100 %     Weight 03/22/19 1454 155 lb (70.3 kg)     Height 03/22/19 1454 5\' 11"  (1.803 m)     Head Circumference --      Peak Flow --      Pain Score 03/22/19 1454 4     Pain Loc --      Pain Edu? --      Excl. in [place]? --      Constitutional: Alert and oriented. Well appearing and in no acute distress. Eyes: Conjunctivae are normal. PERRL. EOMI. Head: Atraumatic. ENT:      Ears:       Nose: No congestion/rhinnorhea.      Mouth/Throat: Mucous membranes are moist.  Neck: No stridor.  No cervical spine tenderness to palpation.  Good range of motion with flexion, extension, rotation of the cervical spine Cardiovascular: Normal rate, regular rhythm. Normal S1 and S2.  Good peripheral circulation. Respiratory: Normal respiratory effort without tachypnea or retractions. Lungs CTAB. Good air entry to the bases with no decreased or absent breath sounds. Musculoskeletal: Full range of motion to all extremities. No gross deformities appreciated.  Visualization of bilateral upper extremities reveals no gross deformity, edema, erythema, signs of trauma.  Full range of motion to the bilateral shoulders, elbows, wrist.  Patient has equal sensation bilateral upper extremities.  Equal grip strength bilateral upper extremities.  Radial pulse intact bilateral upper extremities. Neurologic:  Normal speech and language. No gross focal neurologic deficits are appreciated.  Skin:  Skin is  warm, dry and intact. No rash noted. Psychiatric: Mood and affect are normal. Speech and behavior are normal. Patient exhibits appropriate insight and judgement.   ____________________________________________   LABS (all labs ordered are listed, but only abnormal results are displayed)  Labs Reviewed  BASIC METABOLIC PANEL - Abnormal; Notable for the following components:      Result Value   Glucose, Bld 110 (*)    All other components within normal limits  URINALYSIS, COMPLETE (UACMP) WITH MICROSCOPIC - Abnormal; Notable for the following components:   Color, Urine STRAW (*)    APPearance CLEAR (*)    All other components within normal limits  CBC  CBG MONITORING, ED   ____________________________________________  EKG  ED ECG REPORT I, Charline Bills Jamael Hoffmann,  personally viewed and interpreted this ECG.   Date: 03/22/2019  EKG Time: 1503 hrs.  Rate: 56 bpm  Rhythm: unchanged from previous tracings, sinus  bradycardia  Axis: Normal axis  Intervals:none  ST&T Change: No ST elevation or depression noted  Sinus bradycardia.  No STEMI.  No significant changes from previous EKG  ____________________________________________  RADIOLOGY   No results found.  ____________________________________________    PROCEDURES  Procedure(s) performed:    Procedures    Medications - No data to display   ____________________________________________   INITIAL IMPRESSION / ASSESSMENT AND PLAN / ED COURSE  Pertinent labs & imaging results that were available during my care of the patient were reviewed by me and considered in my medical decision making (see chart for details).  Review of the [place] CSRS was performed in accordance of the [place] prior to dispensing any controlled drugs.           Patient's diagnosis is consistent with cervical radiculopathy.  Patient has pain/numbness/tingling in her upper extremities.  Constant symptoms of the right upper extremity,  intermittent in the left.  Exam is reassuring at this time.  Labs, EKG are also reassuring.  At this time no indication for further work-up..  Patient will be placed on low-dose anti-inflammatory, muscle relaxer for symptom relief.  Follow-up primary care as needed.  Patient is given ED precautions to return to the ED for any worsening or new symptoms.     ____________________________________________  FINAL CLINICAL IMPRESSION(S) / ED DIAGNOSES  Final diagnoses:  Cervical radiculopathy      NEW MEDICATIONS STARTED DURING THIS VISIT:  ED Discharge Orders         Ordered    meloxicam (MOBIC) 7.5 MG tablet  Daily     03/22/19 1604    methocarbamol (ROBAXIN) 500 MG tablet  4 times daily     03/22/19 1604              This chart was dictated using voice recognition software/Dragon. Despite best efforts to proofread, errors can occur which can change the meaning. Any change was purely unintentional.    Darletta Moll, PA-C 03/22/19 1604    Nance Pear, MD 03/22/19 [phone]

## 2019-03-24 DIAGNOSIS — C44311 Basal cell carcinoma of skin of nose: Secondary | ICD-10-CM | POA: Diagnosis not present

## 2019-03-24 DIAGNOSIS — C4441 Basal cell carcinoma of skin of scalp and neck: Secondary | ICD-10-CM | POA: Diagnosis not present

## 2019-04-07 DIAGNOSIS — H40053 Ocular hypertension, bilateral: Secondary | ICD-10-CM | POA: Diagnosis not present

## 2019-04-18 ENCOUNTER — Encounter: Payer: Self-pay | Admitting: Internal Medicine

## 2019-04-18 NOTE — Telephone Encounter (Signed)
I agree with notifying the neurosurgeon regarding her symptoms.  If she needs anything from me, let us know.  If he does not feel related and she has persistent symptoms, let us know.

## 2019-04-21 ENCOUNTER — Other Ambulatory Visit: Payer: Self-pay | Admitting: Neurosurgery

## 2019-04-21 DIAGNOSIS — G939 Disorder of brain, unspecified: Secondary | ICD-10-CM

## 2019-04-22 ENCOUNTER — Other Ambulatory Visit: Payer: Self-pay | Admitting: Internal Medicine

## 2019-04-22 DIAGNOSIS — Z1211 Encounter for screening for malignant neoplasm of colon: Secondary | ICD-10-CM

## 2019-04-22 NOTE — Progress Notes (Signed)
Order placed for IFOB

## 2019-04-22 NOTE — Telephone Encounter (Signed)
Pt aware to let us know if she needs anything

## 2019-05-01 ENCOUNTER — Other Ambulatory Visit (INDEPENDENT_AMBULATORY_CARE_PROVIDER_SITE_OTHER): Payer: Medicare HMO

## 2019-05-01 ENCOUNTER — Ambulatory Visit
Admission: RE | Admit: 2019-05-01 | Discharge: 2019-05-01 | Disposition: A | Discharge status: Home / Self Care | Payer: Medicare HMO | Source: Ambulatory Visit | Attending: Neurosurgery | Admitting: Neurosurgery

## 2019-05-01 ENCOUNTER — Other Ambulatory Visit: Payer: Self-pay

## 2019-05-01 DIAGNOSIS — D320 Benign neoplasm of cerebral meninges: Secondary | ICD-10-CM | POA: Diagnosis not present

## 2019-05-01 DIAGNOSIS — G939 Disorder of brain, unspecified: Secondary | ICD-10-CM | POA: Diagnosis not present

## 2019-05-01 DIAGNOSIS — Z1211 Encounter for screening for malignant neoplasm of colon: Secondary | ICD-10-CM

## 2019-05-01 LAB — FECAL OCCULT BLOOD, IMMUNOCHEMICAL: Fecal Occult Bld: NEGATIVE

## 2019-05-01 MED ORDER — GADOBUTROL 1 MMOL/ML IV SOLN
7.0000 mL | Freq: Once | INTRAVENOUS | Status: AC | PRN
  Administered 2019-05-01: 7 mL via INTRAVENOUS

## 2019-05-02 ENCOUNTER — Encounter: Payer: Self-pay | Admitting: Internal Medicine

## 2019-05-05 DIAGNOSIS — H40053 Ocular hypertension, bilateral: Secondary | ICD-10-CM | POA: Diagnosis not present

## 2019-05-06 ENCOUNTER — Ambulatory Visit: Payer: Medicare HMO

## 2019-05-15 ENCOUNTER — Ambulatory Visit: Payer: Medicare HMO

## 2019-05-15 DIAGNOSIS — C44311 Basal cell carcinoma of skin of nose: Secondary | ICD-10-CM | POA: Diagnosis not present

## 2019-05-18 ENCOUNTER — Ambulatory Visit: Payer: Medicare HMO | Attending: Internal Medicine

## 2019-05-18 DIAGNOSIS — Z23 Encounter for immunization: Secondary | ICD-10-CM | POA: Insufficient documentation

## 2019-05-18 NOTE — Progress Notes (Signed)
Covid-19 Vaccination Clinic  Name:  Sarah Perry    MRN: [id number] DOB: 16-Jan-1951  05/18/2019  [name] was observed post Covid-19 immunization for 15 minutes without incidence. She was provided with Vaccine Information Sheet and instruction to access the V-Safe system.   [name] was instructed to call 911 with any severe reactions post vaccine: Marland Kitchen Difficulty breathing  . Swelling of your face and throat  . A fast heartbeat  . A bad rash all over your body  . Dizziness and weakness    Immunizations Administered    Name Date Dose VIS Date Route   Pfizer COVID-19 Vaccine 05/18/2019 10:01 AM 0.3 mL 03/21/2019 Intramuscular   Manufacturer: [employer], [employer]   Lot: [id number]   NDC: [address]-4

## 2019-05-23 ENCOUNTER — Ambulatory Visit: Payer: Medicare HMO

## 2019-05-27 DIAGNOSIS — D329 Benign neoplasm of meninges, unspecified: Secondary | ICD-10-CM | POA: Diagnosis not present

## 2019-06-05 DIAGNOSIS — D352 Benign neoplasm of pituitary gland: Secondary | ICD-10-CM | POA: Diagnosis not present

## 2019-06-05 DIAGNOSIS — E039 Hypothyroidism, unspecified: Secondary | ICD-10-CM | POA: Diagnosis not present

## 2019-06-10 ENCOUNTER — Ambulatory Visit: Payer: Medicare HMO | Attending: Internal Medicine

## 2019-06-10 DIAGNOSIS — Z23 Encounter for immunization: Secondary | ICD-10-CM | POA: Insufficient documentation

## 2019-06-10 NOTE — Progress Notes (Signed)
Covid-19 Vaccination Clinic  Name:  KIMBELY POHLE    MRN: [id number] DOB: [birth year]  06/10/2019  Ms. Ditmars was observed post Covid-19 immunization for 15 minutes without incident. She was provided with Vaccine Information Sheet and instruction to access the V-Safe system.   Ms. Felder was instructed to call 911 with any severe reactions post vaccine: Marland Kitchen Difficulty breathing  . Swelling of face and throat  . A fast heartbeat  . A bad rash all over body  . Dizziness and weakness   Immunizations Administered    Name Date Dose VIS Date Route   Pfizer COVID-19 Vaccine 06/10/2019 10:27 AM 0.3 mL 03/21/2019 Intramuscular   Manufacturer: [employer], [employer]   Lot: [id number]   NDC: [address]-4

## 2019-06-13 DIAGNOSIS — E039 Hypothyroidism, unspecified: Secondary | ICD-10-CM | POA: Diagnosis not present

## 2019-06-13 DIAGNOSIS — D352 Benign neoplasm of pituitary gland: Secondary | ICD-10-CM | POA: Diagnosis not present

## 2019-07-03 ENCOUNTER — Other Ambulatory Visit: Payer: Self-pay | Admitting: Internal Medicine

## 2019-08-05 ENCOUNTER — Telehealth (INDEPENDENT_AMBULATORY_CARE_PROVIDER_SITE_OTHER): Payer: Medicare HMO | Admitting: Internal Medicine

## 2019-08-05 ENCOUNTER — Encounter: Payer: Self-pay | Admitting: Internal Medicine

## 2019-08-05 VITALS — Ht 71.0 in | Wt 160.0 lb

## 2019-08-05 DIAGNOSIS — D509 Iron deficiency anemia, unspecified: Secondary | ICD-10-CM

## 2019-08-05 DIAGNOSIS — N951 Menopausal and female climacteric states: Secondary | ICD-10-CM | POA: Diagnosis not present

## 2019-08-05 DIAGNOSIS — Z760 Encounter for issue of repeat prescription: Secondary | ICD-10-CM | POA: Diagnosis not present

## 2019-08-05 DIAGNOSIS — R739 Hyperglycemia, unspecified: Secondary | ICD-10-CM

## 2019-08-05 DIAGNOSIS — D329 Benign neoplasm of meninges, unspecified: Secondary | ICD-10-CM

## 2019-08-05 DIAGNOSIS — Z1231 Encounter for screening mammogram for malignant neoplasm of breast: Secondary | ICD-10-CM

## 2019-08-05 DIAGNOSIS — K219 Gastro-esophageal reflux disease without esophagitis: Secondary | ICD-10-CM

## 2019-08-05 MED ORDER — VENLAFAXINE HCL ER 75 MG PO CP24: 75.0000 mg | ORAL_CAPSULE | Freq: Every day | ORAL | 1 refills | Status: DC

## 2019-08-05 NOTE — Progress Notes (Signed)
Patient ID: Sarah Perry, female   DOB: 08/28/1950, 69 y.o.   MRN: [id number]   Virtual Visit via video Note  This visit type was conducted due to national recommendations for restrictions regarding the COVID-19 pandemic (e.g. social distancing).  This format is felt to be most appropriate for this patient at this time.  All issues noted in this document were discussed and addressed.  No physical exam was performed (except for noted visual exam findings with Video Visits).   I connected with Sarah Perry by a video enabled telemedicine application and verified that I am speaking with the correct person using two identifiers. Location patient: home Location provider: work  Persons participating in the virtual visit: patient, provider  The limitations, risks, security and privacy concerns of performing an evaluation and management service by video and the availability of in person appointments have been discussed.  The patient expressed understanding and agreed to proceed.   Reason for visit: scheduled follow up.    HPI: Has seen neurology, ophthalmology and endocrinology for a known cavernous sinus and sella meningioma.  Most recent MRI reveals no obvious growth.  Dr Adriana Simas recommended f/u MRI in one year.  Endocrinology evaluation revealed no adrenal insufficiency.  Elevated prolactin - no treatment indicated.  Recommended endocrinology f/u in 6 months.  No headache reported.  Pressure in eyes - down.  Recently saw dermatology for removal of nasal lesion.  Healed well.  Has f/u planned with dermatology in 5 months. No chest pain or sob reported.  Tries to stay active.  No acid reflux or abdominal pain reported.  Due mammogram.  She will schedule.  Doing well with effexor.     ROS: See pertinent positives and negatives per HPI.  Past Medical History:  Diagnosis Date  . Basal cell carcinoma   . Degenerative arthritis    hips, knees  . GERD (gastroesophageal reflux disease)   . History of basal  cell carcinoma (BCC)    Multiple BCC's  . Varicella zoster 10/09    Past Surgical History:  Procedure Laterality Date  . BASAL CELL CARCINOMA EXCISION    . DILATION AND CURETTAGE OF UTERUS    . TONSILECTOMY/ADENOIDECTOMY WITH MYRINGOTOMY      Family History  Problem Relation Age of Onset  . Ovarian cancer Mother   . Hypercholesterolemia Mother   . Dementia Father   . Prostate cancer Father   . Breast cancer Paternal Aunt   . Pancreatic cancer Maternal Aunt     SOCIAL HX: reviewed.    Current Outpatient Medications:  Marland Kitchen  Magnesium Oxide 400 (240 Mg) MG TABS, , Disp: , Rfl:  .  TRAVOPROST OP, , Disp: , Rfl:  .  acyclovir ointment (ZOVIRAX) 5 %, Apply to affected area on lip qid., Disp: 30 g, Rfl: 0 .  aspirin 81 MG chewable tablet, Chew 1 tablet by mouth daily., Disp: , Rfl:  .  calcium-vitamin D (CALCIUM 500+D) 500-400 MG-UNIT per tablet, Take 1 tablet by mouth daily. , Disp: , Rfl:  .  Multiple Vitamin (MULTIVITAMIN) tablet, Take 1 tablet by mouth daily., Disp: , Rfl:  .  omeprazole (PRILOSEC) 20 MG capsule, TAKE ONE CAPSULE BY MOUTH DAILY, Disp: 90 capsule, Rfl: 2 .  venlafaxine XR (EFFEXOR XR) 75 MG 24 hr capsule, Take 1 capsule (75 mg total) by mouth daily., Disp: 90 capsule, Rfl: 1  EXAM:  GENERAL: alert, oriented, appears well and in no acute distress  HEENT: atraumatic, conjunttiva clear, no obvious abnormalities  on inspection of external nose and ears  NECK: normal movements of the head and neck  LUNGS: on inspection no signs of respiratory distress, breathing rate appears normal, no obvious gross SOB, gasping or wheezing  CV: no obvious cyanosis  PSYCH/NEURO: pleasant and cooperative, no obvious depression or anxiety, speech and thought processing grossly intact  ASSESSMENT AND PLAN:  Discussed the following assessment and plan:  Menopausal syndrome Doing well on effexor.  Continue.  Follow.    Meningioma [facility]) Has been evaluated by neurosurgery,  endocrinology and ophthalmology.  MRI - recent - stable.  Continue to monitor.  [facility] recommended f/u MRI in 12 months.  Endocrinology evaluation - no adrenal insufficiency.  Recommended f/u in 5 months.  Stable.  Follow.    Hyperglycemia Low carb diet and exercise.  Follow met b and a1c.   GERD (gastroesophageal reflux disease) Controlled on omeprazole.   Anemia Follow cbc.    Orders Placed This Encounter  Procedures  . MM 3D SCREEN BREAST BILATERAL    Standing Status:   Future    Standing Expiration Date:   10/04/2020    Order Specific Question:   Reason for Exam (SYMPTOM  OR DIAGNOSIS REQUIRED)    Answer:   [facility] MAMMO    Order Specific Question:   Preferred imaging location?    Answer:   [facility]    Meds ordered this encounter  Medications  . venlafaxine XR (EFFEXOR XR) 75 MG 24 hr capsule    Sig: Take 1 capsule (75 mg total) by mouth daily.    Dispense:  90 capsule    Refill:  1     I discussed the assessment and treatment plan with the patient. The patient was provided an opportunity to ask questions and all were answered. The patient agreed with the plan and demonstrated an understanding of the instructions.   The patient was advised to call back or seek an in-person evaluation if the symptoms worsen or if the condition fails to improve as anticipated.    Dale [place], MD

## 2019-08-10 ENCOUNTER — Encounter: Payer: Self-pay | Admitting: Internal Medicine

## 2019-08-10 NOTE — Assessment & Plan Note (Signed)
Low carb diet and exercise.  Follow met b and a1c.  

## 2019-08-10 NOTE — Assessment & Plan Note (Signed)
Controlled on omeprazole.   

## 2019-08-10 NOTE — Assessment & Plan Note (Signed)
Doing well on effexor.  Continue.  Follow.

## 2019-08-10 NOTE — Assessment & Plan Note (Signed)
Follow cbc.  

## 2019-08-10 NOTE — Assessment & Plan Note (Signed)
Has been evaluated by neurosurgery, endocrinology and ophthalmology.  MRI - recent - stable.  Continue to monitor.  [facility] recommended f/u MRI in 12 months.  Endocrinology evaluation - no adrenal insufficiency.  Recommended f/u in 5 months.  Stable.  Follow.

## 2019-09-09 DIAGNOSIS — Z85828 Personal history of other malignant neoplasm of skin: Secondary | ICD-10-CM

## 2019-09-09 HISTORY — DX: Personal history of other malignant neoplasm of skin: Z85.828

## 2019-09-17 ENCOUNTER — Ambulatory Visit
Admission: RE | Admit: 2019-09-17 | Discharge: 2019-09-17 | Disposition: A | Discharge status: Home / Self Care | Payer: Medicare HMO | Source: Ambulatory Visit | Attending: Internal Medicine | Admitting: Internal Medicine

## 2019-09-17 DIAGNOSIS — Z1231 Encounter for screening mammogram for malignant neoplasm of breast: Secondary | ICD-10-CM | POA: Insufficient documentation

## 2019-09-23 ENCOUNTER — Ambulatory Visit (INDEPENDENT_AMBULATORY_CARE_PROVIDER_SITE_OTHER): Payer: Medicare HMO | Admitting: Dermatology

## 2019-09-23 ENCOUNTER — Encounter: Payer: Self-pay | Admitting: Dermatology

## 2019-09-23 ENCOUNTER — Other Ambulatory Visit: Payer: Self-pay

## 2019-09-23 DIAGNOSIS — C44319 Basal cell carcinoma of skin of other parts of face: Secondary | ICD-10-CM | POA: Diagnosis not present

## 2019-09-23 DIAGNOSIS — L570 Actinic keratosis: Secondary | ICD-10-CM | POA: Diagnosis not present

## 2019-09-23 DIAGNOSIS — L738 Other specified follicular disorders: Secondary | ICD-10-CM

## 2019-09-23 DIAGNOSIS — D489 Neoplasm of uncertain behavior, unspecified: Secondary | ICD-10-CM

## 2019-09-23 DIAGNOSIS — D229 Melanocytic nevi, unspecified: Secondary | ICD-10-CM

## 2019-09-23 DIAGNOSIS — D1801 Hemangioma of skin and subcutaneous tissue: Secondary | ICD-10-CM | POA: Diagnosis not present

## 2019-09-23 DIAGNOSIS — L821 Other seborrheic keratosis: Secondary | ICD-10-CM | POA: Diagnosis not present

## 2019-09-23 DIAGNOSIS — Z1283 Encounter for screening for malignant neoplasm of skin: Secondary | ICD-10-CM

## 2019-09-23 DIAGNOSIS — L578 Other skin changes due to chronic exposure to nonionizing radiation: Secondary | ICD-10-CM

## 2019-09-23 DIAGNOSIS — D692 Other nonthrombocytopenic purpura: Secondary | ICD-10-CM

## 2019-09-23 DIAGNOSIS — L814 Other melanin hyperpigmentation: Secondary | ICD-10-CM | POA: Diagnosis not present

## 2019-09-23 DIAGNOSIS — L820 Inflamed seborrheic keratosis: Secondary | ICD-10-CM

## 2019-09-23 DIAGNOSIS — C44519 Basal cell carcinoma of skin of other part of trunk: Secondary | ICD-10-CM

## 2019-09-23 DIAGNOSIS — Z85828 Personal history of other malignant neoplasm of skin: Secondary | ICD-10-CM

## 2019-09-23 NOTE — Patient Instructions (Addendum)
Recommend daily broad spectrum sunscreen SPF 30+ to sun-exposed areas, reapply every 2 hours as needed. Call for new or changing lesions.   Prior to procedure, discussed risks of blister formation, small wound, skin dyspigmentation, or rare scar following cryotherapy.  Cryotherapy Aftercare  . Wash gently with soap and water everyday.   . Apply Vaseline and Band-Aid daily until healed.   

## 2019-09-23 NOTE — Progress Notes (Signed)
Follow-Up Visit   Subjective  Sarah Perry is a 69 y.o. female who presents for the following: TBSE.  Patient presents today for a TBSE, no areas of concern at this time. Patient does have an extensive history of BCC  The following portions of the chart were reviewed this encounter and updated as appropriate:      Review of Systems:  No other skin or systemic complaints except as noted in HPI or Assessment and Plan.  Objective  Well appearing patient in no apparent distress; mood and affect are within normal limits.  A full examination was performed including scalp, head, eyes, ears, nose, lips, neck, chest, axillae, abdomen, back, buttocks, bilateral upper extremities, bilateral lower extremities, hands, feet, fingers, toes, fingernails, and toenails. All findings within normal limits unless otherwise noted below.  Objective  Central Nasal Tip 02/28/19, Left Lateral Pretibial 09/17/18, Left Lower Pretibial 02/11/2018, Left Spinal Mid Back 12/08/2014, Left Upper Sternum 12/08/2014, Right  Parietal Scalp 02/17/2018, Right Medial Pretibial 02/11/2018, Right Shoulder 09/17/18, Right Spinal Upper Back 12/08/2014: Well healed scar with no evidence of recurrence.  Nasal tip with healed skin graft  Objective  Left lateral Neck, L inf jaw (2): Erythematous thin papules/macules with gritty scale.   Objective  Left Pretibia, Right Pretibia (3): Erythematous keratotic waxy stuck-on papule   Objective  Face: Small yellow papules with a central dell.   Objective  Right Mid Chin: 5 x 4 mm pink papule slightly pearly       Objective  Right Clavicle: 9 x 4 mm pearly pink papule        Assessment & Plan  History of basal cell carcinoma (BCC) (9) Right Shoulder 09/17/18; Left Lateral Pretibial 09/17/18; Left Lower Pretibial 02/11/2018; Right Medial Pretibial 02/11/2018; Left Upper Sternum 12/08/2014; Left Spinal Mid Back 12/08/2014; Right Spinal Upper Back 12/08/2014; Right  Parietal Scalp  02/17/2018; Central Nasal Tip 02/28/19  Bx proven 02/28/2019 Moh's  (Central Nasal Tip) in May 22, 2019 with Dr. Konrad Saha. Observe for recurrence. Call clinic for new or changing lesions.  Recommend regular skin exams, daily broad-spectrum spf 30+ sunscreen use, and photoprotection.     AK (actinic keratosis) (2) Left lateral Neck, L inf jaw  Cryotherapy today Prior to procedure, discussed risks of blister formation, small wound, skin dyspigmentation, or rare scar following cryotherapy.    Destruction of lesion - Left lateral Neck, L inf jaw  Destruction method: cryotherapy   Informed consent: discussed and consent obtained   Lesion destroyed using liquid nitrogen: Yes   Region frozen until ice ball extended beyond lesion: Yes   Outcome: patient tolerated procedure well with no complications   Post-procedure details: wound care instructions given    Inflamed seborrheic keratosis (4) Left Pretibia; Right Pretibia (3)  Cryotherapy today, recheck on f/up Prior to procedure, discussed risks of blister formation, small wound, skin dyspigmentation, or rare scar following cryotherapy.    Destruction of lesion - Left Pretibia, Right Pretibia  Destruction method: cryotherapy   Informed consent: discussed and consent obtained   Lesion destroyed using liquid nitrogen: Yes   Region frozen until ice ball extended beyond lesion: Yes   Outcome: patient tolerated procedure well with no complications   Post-procedure details: wound care instructions given    Sebaceous hyperplasia Face  Neoplasm of uncertain behavior (2) Right Mid Chin  Skin / nail biopsy Type of biopsy: tangential   Informed consent: discussed and consent obtained   Patient was prepped and draped in usual  sterile fashion: area was prepped with  Isopropyl Alcohol. Anesthesia: the lesion was anesthetized in a standard fashion   Anesthetic:  1% lidocaine w/ epinephrine 1-100,000 buffered w/ 8.4%  NaHCO3 Instrument used: flexible razor blade   Hemostasis achieved with: pressure, aluminum chloride and electrodesiccation   Outcome: patient tolerated procedure well   Post-procedure details: wound care instructions given   Post-procedure details comment:  Ointment and small bandage applied  Specimen 1 - Surgical pathology Differential Diagnosis: Nevus R/O BCC Check Margins: No 5 x 4 mm pink papule slightly pearly  Right Clavicle  Skin / nail biopsy Type of biopsy: tangential   Informed consent: discussed and consent obtained   Patient was prepped and draped in usual sterile fashion: area was prepped with  Isopropyl Alcohol. Anesthesia: the lesion was anesthetized in a standard fashion   Anesthetic:  1% lidocaine w/ epinephrine 1-100,000 buffered w/ 8.4% NaHCO3 Instrument used: flexible razor blade   Hemostasis achieved with: pressure, aluminum chloride and electrodesiccation   Outcome: patient tolerated procedure well   Post-procedure details: wound care instructions given   Post-procedure details comment:  Ointment and small bandage applied  Destruction of lesion  Destruction method: electrodesiccation and curettage   Informed consent: discussed and consent obtained   Timeout:  patient name, date of birth, surgical site, and procedure verified Patient was prepped and draped in usual sterile fashion: area prepped with Isopropyl Alcohol. Curettage performed in three different directions: Yes   Electrodesiccation performed over the curetted area: Yes   Lesion length (cm):  0.9 Lesion width (cm):  0.4 Margin per side (cm):  0.1 Final wound size (cm):  1.1 Hemostasis achieved with:  pressure, aluminum chloride and electrodesiccation Outcome: patient tolerated procedure well with no complications   Post-procedure details: wound care instructions given   Post-procedure details comment:  Ointment and bandage applied  Specimen 2 - Surgical pathology Differential Diagnosis: R/O  BCC Check Margins: No 9 x 4 mm pearly pink papule   Lentigines - Scattered tan macules - Discussed due to sun exposure - Benign, observe - Call for any changes  Seborrheic Keratoses - Stuck-on, waxy, tan-brown papules and plaques  - Discussed benign etiology and prognosis. - Observe - Call for any changes  Melanocytic Nevi - Tan-brown and/or pink-flesh-colored symmetric macules and papules - Benign appearing on exam today - Observation - Call clinic for new or changing moles - Recommend daily use of broad spectrum spf 30+ sunscreen to sun-exposed areas.   Hemangiomas - Red papules - Discussed benign nature - Observe - Call for any changes  Actinic Damage - diffuse scaly erythematous macules with underlying dyspigmentation - Recommend daily broad spectrum sunscreen SPF 30+ to sun-exposed areas, reapply every 2 hours as needed.  - Call for new or changing lesions.  Purpura - Violaceous macules and patches - Benign - Related to age, sun damage and/or use of blood thinners - Observe - Can use OTC arnica containing moisturizer such as Dermend Bruise Formula if desired - Call for worsening or other concerns   Skin cancer screening performed today.   Return in about 6 months (around 03/24/2020) for AK Follow up.   Marene Lenz, CMA, am acting as scribe for Brendolyn Patty, MD .  Documentation: I have reviewed the above documentation for accuracy and completeness, and I agree with the above.  Brendolyn Patty MD

## 2019-09-29 ENCOUNTER — Telehealth: Payer: Self-pay

## 2019-09-29 NOTE — Telephone Encounter (Signed)
Left message for patient to call office for biopsy results. 

## 2019-09-29 NOTE — Telephone Encounter (Signed)
-----   Message from Brendolyn Patty, MD sent at 09/29/2019  1:20 PM EDT ----- 1. Skin , right mid chin BASAL CELL CARCINOMA, NODULAR PATTERN 2. Skin , right clavicle BASAL CELL CARCINOMA, NODULAR PATTERN  1. BCC- needs EDC vrs Mohs surgery.  EDC would leave a round depressed whitish scar about the same size as the original lesion.  It is treated here in office. No further pathology would be performed.  Mid to high 80% cure rate.  Mohs would leave a linear scar and pathology would be done at time of procedure to ensure complete removal.  We would refer patient for Mohs ([facility]) which has a high 90s% cure rate. 2. BCC- already treated with EDC at time of biopsy

## 2019-10-01 ENCOUNTER — Other Ambulatory Visit: Payer: Self-pay

## 2019-10-01 DIAGNOSIS — C44319 Basal cell carcinoma of skin of other parts of face: Secondary | ICD-10-CM

## 2019-10-01 NOTE — Telephone Encounter (Signed)
Patient advised of biopsy results. She would like to go back to Dr. Lacinda Axon for [facility] on her chin. I will make referral.

## 2019-10-01 NOTE — Progress Notes (Signed)
Referral for [facility] to Dr. Onalee Hua.

## 2019-11-03 DIAGNOSIS — H40053 Ocular hypertension, bilateral: Secondary | ICD-10-CM | POA: Diagnosis not present

## 2019-11-04 DIAGNOSIS — R69 Illness, unspecified: Secondary | ICD-10-CM | POA: Diagnosis not present

## 2019-11-10 DIAGNOSIS — C44319 Basal cell carcinoma of skin of other parts of face: Secondary | ICD-10-CM | POA: Diagnosis not present

## 2019-12-17 DIAGNOSIS — E039 Hypothyroidism, unspecified: Secondary | ICD-10-CM | POA: Diagnosis not present

## 2019-12-17 DIAGNOSIS — D352 Benign neoplasm of pituitary gland: Secondary | ICD-10-CM | POA: Diagnosis not present

## 2019-12-22 DIAGNOSIS — D352 Benign neoplasm of pituitary gland: Secondary | ICD-10-CM | POA: Diagnosis not present

## 2019-12-22 DIAGNOSIS — E039 Hypothyroidism, unspecified: Secondary | ICD-10-CM | POA: Diagnosis not present

## 2020-01-01 ENCOUNTER — Encounter: Payer: Self-pay | Admitting: Internal Medicine

## 2020-01-01 DIAGNOSIS — R69 Illness, unspecified: Secondary | ICD-10-CM | POA: Diagnosis not present

## 2020-01-01 DIAGNOSIS — Z1211 Encounter for screening for malignant neoplasm of colon: Secondary | ICD-10-CM

## 2020-01-06 NOTE — Telephone Encounter (Signed)
Order placed for cologuard.  Please notify pt.

## 2020-01-16 DIAGNOSIS — Z8249 Family history of ischemic heart disease and other diseases of the circulatory system: Secondary | ICD-10-CM | POA: Diagnosis not present

## 2020-01-16 DIAGNOSIS — Z85828 Personal history of other malignant neoplasm of skin: Secondary | ICD-10-CM | POA: Diagnosis not present

## 2020-01-16 DIAGNOSIS — Z880 Allergy status to penicillin: Secondary | ICD-10-CM | POA: Diagnosis not present

## 2020-01-16 DIAGNOSIS — Z809 Family history of malignant neoplasm, unspecified: Secondary | ICD-10-CM | POA: Diagnosis not present

## 2020-01-16 DIAGNOSIS — H04129 Dry eye syndrome of unspecified lacrimal gland: Secondary | ICD-10-CM | POA: Diagnosis not present

## 2020-01-16 DIAGNOSIS — K219 Gastro-esophageal reflux disease without esophagitis: Secondary | ICD-10-CM | POA: Diagnosis not present

## 2020-01-27 DIAGNOSIS — Z1211 Encounter for screening for malignant neoplasm of colon: Secondary | ICD-10-CM | POA: Diagnosis not present

## 2020-01-27 LAB — COLOGUARD: Cologuard: NEGATIVE

## 2020-02-10 ENCOUNTER — Encounter: Payer: Medicare HMO | Admitting: Internal Medicine

## 2020-03-08 ENCOUNTER — Encounter: Payer: Self-pay | Admitting: Internal Medicine

## 2020-03-08 ENCOUNTER — Other Ambulatory Visit: Payer: Self-pay

## 2020-03-08 ENCOUNTER — Ambulatory Visit (INDEPENDENT_AMBULATORY_CARE_PROVIDER_SITE_OTHER): Payer: Medicare HMO | Admitting: Internal Medicine

## 2020-03-08 ENCOUNTER — Ambulatory Visit (INDEPENDENT_AMBULATORY_CARE_PROVIDER_SITE_OTHER): Payer: Medicare HMO

## 2020-03-08 ENCOUNTER — Other Ambulatory Visit: Payer: Self-pay | Admitting: Internal Medicine

## 2020-03-08 VITALS — BP 130/76 | HR 70 | Temp 97.8°F | Resp 16 | Ht 71.0 in | Wt 162.4 lb

## 2020-03-08 DIAGNOSIS — Z13220 Encounter for screening for lipoid disorders: Secondary | ICD-10-CM

## 2020-03-08 DIAGNOSIS — M25511 Pain in right shoulder: Secondary | ICD-10-CM | POA: Insufficient documentation

## 2020-03-08 DIAGNOSIS — D509 Iron deficiency anemia, unspecified: Secondary | ICD-10-CM | POA: Diagnosis not present

## 2020-03-08 DIAGNOSIS — Z0000 Encounter for general adult medical examination without abnormal findings: Secondary | ICD-10-CM

## 2020-03-08 DIAGNOSIS — K219 Gastro-esophageal reflux disease without esophagitis: Secondary | ICD-10-CM

## 2020-03-08 DIAGNOSIS — M19011 Primary osteoarthritis, right shoulder: Secondary | ICD-10-CM | POA: Diagnosis not present

## 2020-03-08 DIAGNOSIS — N951 Menopausal and female climacteric states: Secondary | ICD-10-CM

## 2020-03-08 DIAGNOSIS — R739 Hyperglycemia, unspecified: Secondary | ICD-10-CM

## 2020-03-08 DIAGNOSIS — D329 Benign neoplasm of meninges, unspecified: Secondary | ICD-10-CM

## 2020-03-08 LAB — LIPID PANEL
Cholesterol: 218 mg/dL — ABNORMAL HIGH (ref 0–200)
HDL: 95.4 mg/dL (ref >39.00)
LDL Cholesterol: 107 mg/dL — ABNORMAL HIGH (ref 0–99)
NonHDL: 122.98
Total CHOL/HDL Ratio: 2
Triglycerides: 80 mg/dL (ref 0.0–149.0)
VLDL: 16 mg/dL (ref 0.0–40.0)

## 2020-03-08 LAB — COMPREHENSIVE METABOLIC PANEL
ALT: 16 U/L (ref 0–35)
AST: 24 U/L (ref 0–37)
Albumin: 4.4 g/dL (ref 3.5–5.2)
Alkaline Phosphatase: 99 U/L (ref 39–117)
BUN: 21 mg/dL (ref 6–23)
CO2: 32 mEq/L (ref 19–32)
Calcium: 9.3 mg/dL (ref 8.4–10.5)
Chloride: 103 mEq/L (ref 96–112)
Creatinine, Ser: 0.68 mg/dL (ref 0.40–1.20)
GFR: 90.25 mL/min (ref >60.00)
Glucose, Bld: 98 mg/dL (ref 70–99)
Potassium: 4.5 mEq/L (ref 3.5–5.1)
Sodium: 141 mEq/L (ref 135–145)
Total Bilirubin: 0.4 mg/dL (ref 0.2–1.2)
Total Protein: 6.7 g/dL (ref 6.0–8.3)

## 2020-03-08 LAB — CBC WITH DIFFERENTIAL/PLATELET
Basophils Absolute: 0.1 10*3/uL (ref 0.0–0.1)
Basophils Relative: 1.2 % (ref 0.0–3.0)
Eosinophils Absolute: 0.1 10*3/uL (ref 0.0–0.7)
Eosinophils Relative: 2.3 % (ref 0.0–5.0)
HCT: 38 % (ref 36.0–46.0)
Hemoglobin: 12.7 g/dL (ref 12.0–15.0)
Lymphocytes Relative: 25.9 % (ref 12.0–46.0)
Lymphs Abs: 1.2 10*3/uL (ref 0.7–4.0)
MCHC: 33.6 g/dL (ref 30.0–36.0)
MCV: 91 fl (ref 78.0–100.0)
Monocytes Absolute: 0.4 10*3/uL (ref 0.1–1.0)
Monocytes Relative: 8.9 % (ref 3.0–12.0)
Neutro Abs: 2.9 10*3/uL (ref 1.4–7.7)
Neutrophils Relative %: 61.7 % (ref 43.0–77.0)
Platelets: 223 10*3/uL (ref 150.0–400.0)
RBC: 4.17 Mil/uL (ref 3.87–5.11)
RDW: 14.1 % (ref 11.5–15.5)
WBC: 4.8 10*3/uL (ref 4.0–10.5)

## 2020-03-08 LAB — TSH: TSH: 3.06 u[IU]/mL (ref 0.35–4.50)

## 2020-03-08 LAB — HEMOGLOBIN A1C: Hgb A1c MFr Bld: 5.7 % (ref 4.6–6.5)

## 2020-03-08 MED ORDER — VENLAFAXINE HCL ER 75 MG PO CP24: 75.0000 mg | ORAL_CAPSULE | Freq: Every day | ORAL | 1 refills | Status: DC

## 2020-03-08 MED ORDER — OMEPRAZOLE 20 MG PO CPDR: 20.0000 mg | DELAYED_RELEASE_CAPSULE | Freq: Every day | ORAL | 2 refills | Status: DC

## 2020-03-08 NOTE — Assessment & Plan Note (Signed)
Persistent pain.  Check shoulder xray.  May need PT.  Further w/up pending results.

## 2020-03-08 NOTE — Assessment & Plan Note (Signed)
Follow cbc.  

## 2020-03-08 NOTE — Assessment & Plan Note (Signed)
Doing well on effexor.  Continue.   ?

## 2020-03-08 NOTE — Assessment & Plan Note (Addendum)
Physical today 03/08/20.  PAP 06/26/17 - negative with negative HPV.  Mammogram 09/17/19 - Birads I.  Colonoscopy 2012.  Pt reported f/u due 10 years. Elected cologuard.  cologuard 01/2020 negative.

## 2020-03-08 NOTE — Assessment & Plan Note (Signed)
Low carb diet and exercise.  Follow met b and a1c.  

## 2020-03-08 NOTE — Progress Notes (Signed)
Patient ID: Sarah Perry, female   DOB: 1950-12-21, 69 y.o.   MRN: [id number]   Subjective:    Patient ID: Sarah Perry, female    DOB: 02-26-1951, 69 y.o.   MRN: [id number]  HPI This visit occurred during the SARS-CoV-2 public health emergency.  Safety protocols were in place, including screening questions prior to the visit, additional usage of staff PPE, and extensive cleaning of exam room while observing appropriate contact time as indicated for disinfecting solutions.  Patient here for her physical exam.  She reports she is doing well.  Feels good.  Stays active.  Main complaint is right shoulder pain.  Pain present for the last 1-2 months.  Increased pain with reaching and lifting arm.  No fall or known trauma.  May have aggravated when reaching behind her car seat to pick up something heavy.  No chest pain or sob reported.  No acid reflux or abdominal pain reported.  No bowel problem.  Has been exercising with silver sneakers.  Shoulder pain limits her with doing some of the exercises.    Past Medical History:  Diagnosis Date   Basal cell carcinoma    Degenerative arthritis    hips, knees   GERD (gastroesophageal reflux disease)    History of basal cell carcinoma (BCC)    Multiple BCC's   Varicella zoster 10/09   Past Surgical History:  Procedure Laterality Date   BASAL CELL CARCINOMA EXCISION     DILATION AND CURETTAGE OF UTERUS     TONSILECTOMY/ADENOIDECTOMY WITH MYRINGOTOMY     Family History  Problem Relation Age of Onset   Ovarian cancer Mother    Hypercholesterolemia Mother    Dementia Father    Prostate cancer Father    Breast cancer Paternal Aunt    Pancreatic cancer Maternal Aunt    Social History   Socioeconomic History   Marital status: Married    Spouse name: Not on file   Number of children: 2   Years of education: Not on file   Highest education level: Not on file  Occupational History   Not on file  Tobacco Use   Smoking status:  Never Smoker   Smokeless tobacco: Never Used  Vaping Use   Vaping Use: Never used  Substance and Sexual Activity   Alcohol use: Yes    Alcohol/week: 0.0 standard drinks    Comment: glass of wine daily   Drug use: No   Sexual activity: Not on file  Other Topics Concern   Not on file  Social History Narrative   Not on file   Social Determinants of Health   Financial Resource Strain: Low Risk    Difficulty of Paying Living Expenses: Not hard at all  Food Insecurity: No Food Insecurity   Worried About Programme researcher, broadcasting/film/video in the Last Year: Never true   Ran Out of Food in the Last Year: Never true  Transportation Needs: No Transportation Needs   Lack of Transportation (Medical): No   Lack of Transportation (Non-Medical): No  Physical Activity: Sufficiently Active   Days of Exercise per Week: 5 days   Minutes of Exercise per Session: 30 min  Stress: No Stress Concern Present   Feeling of Stress : Not at all  Social Connections:    Frequency of Communication with Friends and Family: Not on file   Frequency of Social Gatherings with Friends and Family: Not on file   Attends Religious Services: Not on file   Active Member  of Clubs or Organizations: Not on file   Attends Club or Organization Meetings: Not on file   Marital Status: Not on file    Outpatient Encounter Medications as of 03/08/2020  Medication Sig   acyclovir ointment (ZOVIRAX) 5 % Apply to affected area on lip qid. (Patient not taking: Reported on 09/23/2019)   aspirin 81 MG chewable tablet Chew 1 tablet by mouth daily.   calcium-vitamin D (CALCIUM 500+D) 500-400 MG-UNIT per tablet Take 1 tablet by mouth daily.    Magnesium Oxide 400 (240 Mg) MG TABS    Multiple Vitamin (MULTIVITAMIN) tablet Take 1 tablet by mouth daily.   omeprazole (PRILOSEC) 20 MG capsule Take 1 capsule (20 mg total) by mouth daily.   TRAVOPROST OP    venlafaxine XR (EFFEXOR XR) 75 MG 24 hr capsule Take 1 capsule (75  mg total) by mouth daily.   [DISCONTINUED] omeprazole (PRILOSEC) 20 MG capsule TAKE ONE CAPSULE BY MOUTH DAILY   [DISCONTINUED] venlafaxine XR (EFFEXOR XR) 75 MG 24 hr capsule Take 1 capsule (75 mg total) by mouth daily.   No facility-administered encounter medications on file as of 03/08/2020.    Review of Systems  Constitutional: Negative for appetite change and unexpected weight change.  HENT: Negative for congestion, sinus pressure and sore throat.   Eyes: Negative for pain and visual disturbance.  Respiratory: Negative for cough, chest tightness and shortness of breath.   Cardiovascular: Negative for chest pain, palpitations and leg swelling.  Gastrointestinal: Negative for abdominal pain, diarrhea, nausea and vomiting.  Genitourinary: Negative for difficulty urinating and dysuria.  Musculoskeletal: Negative for joint swelling, myalgias and neck pain.       Shoulder pain as outlined.   Skin: Negative for color change and rash.  Neurological: Negative for dizziness, light-headedness and headaches.  Hematological: Negative for adenopathy. Does not bruise/bleed easily.  Psychiatric/Behavioral: Negative for agitation and dysphoric mood.       Objective:    Physical Exam Vitals reviewed.  Constitutional:      General: She is not in acute distress.    Appearance: Normal appearance. She is well-developed.  HENT:     Head: Normocephalic and atraumatic.     Right Ear: External ear normal.     Left Ear: External ear normal.  Eyes:     General: No scleral icterus.       Right eye: No discharge.        Left eye: No discharge.     Conjunctiva/sclera: Conjunctivae normal.  Neck:     Thyroid: No thyromegaly.  Cardiovascular:     Rate and Rhythm: Normal rate and regular rhythm.  Pulmonary:     Effort: No tachypnea, accessory muscle usage or respiratory distress.     Breath sounds: Normal breath sounds. No decreased breath sounds or wheezing.  Chest:     Breasts:        Right:  No inverted nipple, mass, nipple discharge or tenderness (no axillary adenopathy).        Left: No inverted nipple, mass, nipple discharge or tenderness (no axilarry adenopathy).  Abdominal:     General: Bowel sounds are normal.     Palpations: Abdomen is soft.     Tenderness: There is no abdominal tenderness.  Musculoskeletal:        General: No swelling or tenderness.     Cervical back: Neck supple. No tenderness.     Comments: Increased pain right shoulder with full extension of arm reaching forward.  Also with abduction  above 90 degrees.  Motor strength equal and normal bilateral upper extremities.  No neck pain with rotation of neck.   Lymphadenopathy:     Cervical: No cervical adenopathy.  Skin:    Findings: No erythema or rash.  Neurological:     Mental Status: She is alert and oriented to person, place, and time.  Psychiatric:        Mood and Affect: Mood normal.        Behavior: Behavior normal.     BP 130/76    Pulse 70    Temp 97.8 F (36.6 C) (Oral)    Resp 16    Ht 5\' 11"  (1.803 m)    Wt 162 lb 6.4 oz (73.7 kg)    SpO2 98%    BMI 22.65 kg/m  Wt Readings from Last 3 Encounters:  03/08/20 162 lb 6.4 oz (73.7 kg)  08/05/19 160 lb (72.6 kg)  03/22/19 155 lb (70.3 kg)     Lab Results  Component Value Date   WBC 4.8 03/08/2020   HGB 12.7 03/08/2020   HCT 38.0 03/08/2020   PLT 223.0 03/08/2020   GLUCOSE 98 03/08/2020   CHOL 218 (H) 03/08/2020   TRIG 80.0 03/08/2020   HDL 95.40 03/08/2020   LDLCALC 107 (H) 03/08/2020   ALT 16 03/08/2020   AST 24 03/08/2020   NA 141 03/08/2020   K 4.5 03/08/2020   CL 103 03/08/2020   CREATININE 0.68 03/08/2020   BUN 21 03/08/2020   CO2 32 03/08/2020   TSH 3.06 03/08/2020   INR 0.9 10/24/2018   HGBA1C 5.7 03/08/2020    MM 3D SCREEN BREAST BILATERAL  Result Date: 09/17/2019 CLINICAL DATA:  Screening. EXAM: DIGITAL SCREENING BILATERAL MAMMOGRAM WITH TOMO AND CAD COMPARISON:  Previous exam(s). ACR Breast Density Category c:  The breast tissue is heterogeneously dense, which may obscure small masses. FINDINGS: There are no findings suspicious for malignancy. Images were processed with CAD. IMPRESSION: No mammographic evidence of malignancy. A result letter of this screening mammogram will be mailed directly to the patient. RECOMMENDATION: Screening mammogram in one year. (Code:SM-B-01Y) BI-RADS CATEGORY  1: Negative. Electronically Signed   By: Frederico Hamman M.D.   On: 09/17/2019 11:39       Assessment & Plan:   Problem List Items Addressed This Visit    Shoulder pain, right    Persistent pain.  Check shoulder xray.  May need PT.  Further w/up pending results.       Relevant Orders   DG Shoulder Right (Completed)   Menopausal syndrome    Doing well on effexor.  Continue.        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  Has been evaluated by neurosurgery, endocrinology and ophthalmology.  Just evaluated by Dr Tedd Sias 12/2019.  Stable.  Recommended f/u in 6 months with labs prior.  Has f/u planned with neurosurgery 05/2020.        Relevant Orders   TSH (Completed)   Hyperglycemia    Low carb diet and exercise.  Follow met b and a1c.       Relevant Orders   CBC with Differential/Platelet (Completed)   Hemoglobin A1c (Completed)   Comprehensive metabolic panel (Completed)   Health care maintenance    Physical today 03/08/20.  PAP 06/26/17 - negative with negative HPV.  Mammogram 09/17/19 - Birads I.  Colonoscopy 2012.  Pt reported f/u due 10 years. Elected cologuard.  cologuard 01/2020 negative.  GERD (gastroesophageal reflux disease)    No upper symptoms reported.  On prilosec.       Relevant Medications   omeprazole (PRILOSEC) 20 MG capsule   Anemia    Follow cbc.        Other Visit Diagnoses    Routine general medical examination at a health care facility    -  Primary   Screening cholesterol level       Relevant Orders   Lipid panel (Completed)       Dale [place],  MD

## 2020-03-08 NOTE — Progress Notes (Signed)
Order placed for PT referral.  

## 2020-03-08 NOTE — Assessment & Plan Note (Signed)
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  Has been evaluated by neurosurgery, endocrinology and ophthalmology.  Just evaluated by Dr Gabriel Carina 12/2019.  Stable.  Recommended f/u in 6 months with labs prior.  Has f/u planned with neurosurgery 05/2020.

## 2020-03-08 NOTE — Assessment & Plan Note (Signed)
No upper symptoms reported. On prilosec.  

## 2020-03-11 ENCOUNTER — Ambulatory Visit: Payer: Medicare HMO

## 2020-03-12 ENCOUNTER — Ambulatory Visit (INDEPENDENT_AMBULATORY_CARE_PROVIDER_SITE_OTHER): Payer: Medicare HMO

## 2020-03-12 VITALS — Ht 71.0 in | Wt 162.0 lb

## 2020-03-12 DIAGNOSIS — Z0000 Encounter for general adult medical examination without abnormal findings: Secondary | ICD-10-CM | POA: Diagnosis not present

## 2020-03-12 NOTE — Progress Notes (Addendum)
Subjective:   Sarah Perry is a 69 y.o. female who presents for Medicare Annual (Subsequent) preventive examination.  Review of Systems    No ROS.  Medicare Wellness Virtual Visit.    Cardiac Risk Factors include: advanced age (>50men, >54 women)     Objective:    Today's Vitals   03/12/20 0906  Weight: 162 lb (73.5 kg)  Height: 5\' 11"  (1.803 m)   Body mass index is 22.59 kg/m.  Advanced Directives 03/12/2020 03/22/2019 03/11/2019 10/24/2018  Does Patient Have a Medical Advance Directive? No No No No  Would patient like information on creating a medical advance directive? No - Patient declined - No - Patient declined No - Patient declined    Current Medications (verified) Outpatient Encounter Medications as of 03/12/2020  Medication Sig  . acyclovir ointment (ZOVIRAX) 5 % Apply to affected area on lip qid. (Patient not taking: Reported on 09/23/2019)  . aspirin 81 MG chewable tablet Chew 1 tablet by mouth daily.  . calcium-vitamin D (CALCIUM 500+D) 500-400 MG-UNIT per tablet Take 1 tablet by mouth daily.   . Magnesium Oxide 400 (240 Mg) MG TABS   . Multiple Vitamin (MULTIVITAMIN) tablet Take 1 tablet by mouth daily.  Marland Kitchen omeprazole (PRILOSEC) 20 MG capsule Take 1 capsule (20 mg total) by mouth daily.  . TRAVOPROST OP   . venlafaxine XR (EFFEXOR XR) 75 MG 24 hr capsule Take 1 capsule (75 mg total) by mouth daily.   No facility-administered encounter medications on file as of 03/12/2020.    Allergies (verified) Penicillins   History: Past Medical History:  Diagnosis Date  . Basal cell carcinoma   . Degenerative arthritis    hips, knees  . GERD (gastroesophageal reflux disease)   . History of basal cell carcinoma (BCC)    Multiple BCC's  . Varicella zoster 10/09   Past Surgical History:  Procedure Laterality Date  . BASAL CELL CARCINOMA EXCISION    . DILATION AND CURETTAGE OF UTERUS    . TONSILECTOMY/ADENOIDECTOMY WITH MYRINGOTOMY     Family History  Problem  Relation Age of Onset  . Ovarian cancer Mother   . Hypercholesterolemia Mother   . Dementia Father   . Prostate cancer Father   . Breast cancer Paternal Aunt   . Pancreatic cancer Maternal Aunt   . Melanoma Sister    Social History   Socioeconomic History  . Marital status: Married    Spouse name: Not on file  . Number of children: 2  . Years of education: Not on file  . Highest education level: Not on file  Occupational History  . Not on file  Tobacco Use  . Smoking status: Never Smoker  . Smokeless tobacco: Never Used  Vaping Use  . Vaping Use: Never used  Substance and Sexual Activity  . Alcohol use: Yes    Alcohol/week: 0.0 standard drinks    Comment: glass of wine daily  . Drug use: No  . Sexual activity: Not on file  Other Topics Concern  . Not on file  Social History Narrative  . Not on file   Social Determinants of Health   Financial Resource Strain: Low Risk   . Difficulty of Paying Living Expenses: Not hard at all  Food Insecurity: No Food Insecurity  . Worried About Charity fundraiser in the Last Year: Never true  . Ran Out of Food in the Last Year: Never true  Transportation Needs: No Transportation Needs  . Lack of Transportation (  Medical): No  . Lack of Transportation (Non-Medical): No  Physical Activity: Sufficiently Active  . Days of Exercise per Week: 5 days  . Minutes of Exercise per Session: 30 min  Stress: No Stress Concern Present  . Feeling of Stress : Not at all  Social Connections:   . Frequency of Communication with Friends and Family: Not on file  . Frequency of Social Gatherings with Friends and Family: Not on file  . Attends Religious Services: Not on file  . Active Member of Clubs or Organizations: Not on file  . Attends Archivist Meetings: Not on file  . Marital Status: Not on file    Tobacco Counseling Counseling given: Not Answered   Clinical Intake:  Pre-visit preparation completed: Yes         Diabetes: No  How often do you need to have someone help you when you read instructions, pamphlets, or other written materials from your doctor or pharmacy?: 1 - Never  Interpreter Needed?: No      Activities of Daily Living In your present state of health, do you have any difficulty performing the following activities: 03/12/2020  Hearing? N  Vision? N  Difficulty concentrating or making decisions? N  Walking or climbing stairs? N  Dressing or bathing? N  Doing errands, shopping? N  Preparing Food and eating ? N  Using the Toilet? N  In the past six months, have you accidently leaked urine? N  Do you have problems with loss of bowel control? N  Managing your Medications? N  Managing your Finances? N  Housekeeping or managing your Housekeeping? N  Some recent data might be hidden    Patient Care Team: Einar Pheasant, MD as PCP - General (Internal Medicine)  Indicate any recent Medical Services you may have received from other than [facility] providers in the past year (date may be approximate).     Assessment:   This is a routine wellness examination for Sarah Perry.  I connected with [name] today by telephone and verified that I am speaking with the correct person using two identifiers. Location patient: home Location provider: work Persons participating in the virtual visit: patient, Marine scientist.    I discussed the limitations, risks, security and privacy concerns of performing an evaluation and management service by telephone and the availability of in person appointments. The patient expressed understanding and verbally consented to this telephonic visit.    Interactive audio and video telecommunications were attempted between this provider and patient, however failed, due to patient having technical difficulties OR patient did not have access to video capability.  We continued and completed visit with audio only.  Some vital signs may be absent or patient reported.    Hearing/Vision screen  Hearing Screening   125Hz  250Hz  500Hz  1000Hz  2000Hz  3000Hz  4000Hz  6000Hz  8000Hz   Right ear:           Left ear:           Comments: Patient is able to hear conversational tones without difficulty.  No issues reported.   Vision Screening Comments: Followed by [facility] Wears corrective lenses Visual acuity not assessed, virtual visit.  They have seen their ophthalmologist in the last 12 months.    Dietary issues and exercise activities discussed: Current Exercise Habits: Home exercise routine, Time (Minutes): 45, Frequency (Times/Week): 5, Weekly Exercise (Minutes/Week): 225, Intensity: Moderate  Healthy diet Good water intake  Goals    . Maintain Healthy Lifestyle     Stay active Healthy  diet      Depression Screen PHQ 2/9 Scores 03/12/2020 03/11/2019 09/12/2018 10/03/2016 03/30/2015  PHQ - 2 Score 0 0 0 0 0  PHQ- 9 Score - - - 0 -    Fall Risk Fall Risk  03/12/2020 03/08/2020 03/11/2019 09/12/2018 03/30/2015  Falls in the past year? 0 0 0 0 No  Number falls in past yr: 0 - - - -  Follow up Falls evaluation completed Falls evaluation completed Falls prevention discussed;Education provided - -   Handrails in use when climbing stairs? Yes Home free of loose throw rugs in walkways, pet beds, electrical cords, etc? Yes  Adequate lighting in your home to reduce risk of falls? Yes   ASSISTIVE DEVICES UTILIZED TO PREVENT FALLS: Use of a cane, walker or w/c? No   TIMED UP AND GO: Was the test performed? No . Virtual visit.   Cognitive Function:  Patient is alert and oriented x3.  Denies difficulty focusing, making decisions, memory loss.  MMSE/6CIT deferred. Normal cognitive status by direct communication/observation.     6CIT Screen 03/11/2019  What Year? 0 points  What month? 0 points  What time? 0 points  Count back from 20 0 points  Months in reverse 0 points  Repeat phrase 0 points  Total Score 0    Immunizations Immunization  History  Administered Date(s) Administered  . Influenza Split 01/06/2013, 12/30/2013, 01/05/2015  . Influenza, High Dose Seasonal PF 11/28/2018  . Influenza,inj,Quad PF,6+ Mos 12/12/2017  . Influenza-Unspecified 01/01/2020  . PFIZER SARS-COV-2 Vaccination 05/18/2019, 06/10/2019, 01/13/2020  . Pneumococcal Conjugate-13 12/31/2017  . Pneumococcal Polysaccharide-23 02/03/2019  . Tdap 03/11/2020  . Zoster 03/12/2013  . Zoster Recombinat (Shingrix) 01/02/2018, 03/20/2018    Health Maintenance There are no preventive care reminders to display for this patient. Health Maintenance  Topic Date Due  . MAMMOGRAM  09/16/2021  . Fecal DNA (Cologuard)  01/27/2023  . TETANUS/TDAP  03/11/2030  . INFLUENZA VACCINE  Completed  . DEXA SCAN  Completed  . COVID-19 Vaccine  Completed  . Hepatitis C Screening  Completed  . PNA vac Low Risk Adult  Completed   Cologuard- 01/29/20. Negative. Repeat every 3 years.   Mammogram status: Completed 09/17/19. Repeat every year. 3D Bilateral.   Bone Density- 10/29/08.   Lung Cancer Screening: (Low Dose CT Chest recommended if Age 105-80 years, 30 pack-year currently smoking OR have quit w/in 15years.) does not qualify.   Hepatitis C Screening: Completed 05/02/16.   Vision Screening: Recommended annual ophthalmology exams for early detection of glaucoma and other disorders of the eye. Is the patient up to date with their annual eye exam?  Yes  Who is the provider or what is the name of the office in which the patient attends annual eye exams? [place] Screening: Recommended annual dental exams for proper oral hygiene  Community Resource Referral / Chronic Care Management: CRR required this visit?  No   CCM required this visit?  No      Plan:   Keep all routine maintenance appointments.   Follow up  09/08/19 @ 2:00  I have personally reviewed and noted the following in the patient's chart:   . Medical and social history . Use of  alcohol, tobacco or illicit drugs  . Current medications and supplements . Functional ability and status . Nutritional status . Physical activity . Advanced directives . List of other physicians . Hospitalizations, surgeries, and ER visits in previous 12 months . Vitals . Screenings  to include cognitive, depression, and falls . Referrals and appointments  In addition, I have reviewed and discussed with patient certain preventive protocols, quality metrics, and best practice recommendations. A written personalized care plan for preventive services as well as general preventive health recommendations were provided to patient via mychart.     Varney Biles, LPN   10/10/5428     Reviewed above information.  Agree with assessment and plan.   Dr Nicki Reaper

## 2020-03-12 NOTE — Patient Instructions (Addendum)
Ms. Knoop , Thank you for taking time to come for your Medicare Wellness Visit. I appreciate your ongoing commitment to your health goals. Please review the following plan we discussed and let me know if I can assist you in the future.   These are the goals we discussed: Goals    . Maintain Healthy Lifestyle     Stay active Healthy diet       This is a list of the screening recommended for you and due dates:  Health Maintenance  Topic Date Due  . Mammogram  09/16/2021  . Cologuard (Stool DNA test)  01/27/2023  . Tetanus Vaccine  03/11/2030  . Flu Shot  Completed  . DEXA scan (bone density measurement)  Completed  . COVID-19 Vaccine  Completed  .  Hepatitis C: One time screening is recommended by Center for Disease Control  (CDC) for  adults born from [birth year] through 1963.   Completed  . Pneumonia vaccines  Completed    Immunizations Immunization History  Administered Date(s) Administered  . Influenza Split 01/06/2013, 12/30/2013, 01/05/2015  . Influenza, High Dose Seasonal PF 11/28/2018  . Influenza,inj,Quad PF,6+ Mos 12/12/2017  . Influenza-Unspecified 01/01/2020  . PFIZER SARS-COV-2 Vaccination 05/18/2019, 06/10/2019, 01/13/2020  . Pneumococcal Conjugate-13 12/31/2017  . Pneumococcal Polysaccharide-23 02/03/2019  . Tdap 03/11/2020  . Zoster 03/12/2013  . Zoster Recombinat (Shingrix) 01/02/2018, 03/20/2018   Keep all routine maintenance appointments.   Follow up  09/08/19 @ 2:00  Advanced directives: Not yet completed.     Conditions/risks identified: none new.  Follow up in one year for your annual wellness visit.   Preventive Care 9 Years and Older, Female Preventive care refers to lifestyle choices and visits with your health care provider that can promote health and wellness. What does preventive care include?  A yearly physical exam. This is also called an annual well check.  Dental exams once or twice a year.  Routine eye exams. Ask your health care  provider how often you should have your eyes checked.  Personal lifestyle choices, including:  Daily care of your teeth and gums.  Regular physical activity.  Eating a healthy diet.  Avoiding tobacco and drug use.  Limiting alcohol use.  Practicing safe sex.  Taking low-dose aspirin every day.  Taking vitamin and mineral supplements as recommended by your health care provider. What happens during an annual well check? The services and screenings done by your health care provider during your annual well check will depend on your age, overall health, lifestyle risk factors, and family history of disease. Counseling  Your health care provider may ask you questions about your:  Alcohol use.  Tobacco use.  Drug use.  Emotional well-being.  Home and relationship well-being.  Sexual activity.  Eating habits.  History of falls.  Memory and ability to understand (cognition).  Work and work Statistician.  Reproductive health. Screening  You may have the following tests or measurements:  Height, weight, and BMI.  Blood pressure.  Lipid and cholesterol levels. These may be checked every 5 years, or more frequently if you are over 40 years old.  Skin check.  Lung cancer screening. You may have this screening every year starting at age 65 if you have a 30-pack-year history of smoking and currently smoke or have quit within the past 15 years.  Fecal occult blood test (FOBT) of the stool. You may have this test every year starting at age 58.  Flexible sigmoidoscopy or colonoscopy. You may have a sigmoidoscopy  every 5 years or a colonoscopy every 10 years starting at age 30.  Hepatitis C blood test.  Hepatitis B blood test.  Sexually transmitted disease (STD) testing.  Diabetes screening. This is done by checking your blood sugar (glucose) after you have not eaten for a while (fasting). You may have this done every 1-3 years.  Bone density scan. This is done to  screen for osteoporosis. You may have this done starting at age 92.  Mammogram. This may be done every 1-2 years. Talk to your health care provider about how often you should have regular mammograms. Talk with your health care provider about your test results, treatment options, and if necessary, the need for more tests. Vaccines  Your health care provider may recommend certain vaccines, such as:  Influenza vaccine. This is recommended every year.  Tetanus, diphtheria, and acellular pertussis (Tdap, Td) vaccine. You may need a Td booster every 10 years.  Zoster vaccine. You may need this after age 26.  Pneumococcal 13-valent conjugate (PCV13) vaccine. One dose is recommended after age 17.  Pneumococcal polysaccharide (PPSV23) vaccine. One dose is recommended after age 29. Talk to your health care provider about which screenings and vaccines you need and how often you need them. This information is not intended to replace advice given to you by your health care provider. Make sure you discuss any questions you have with your health care provider. Document Released: 04/23/2015 Document Revised: 12/15/2015 Document Reviewed: 01/26/2015 Elsevier Interactive Patient Education  2017 [employer] Prevention in the Home Falls can cause injuries. They can happen to people of all ages. There are many things you can do to make your home safe and to help prevent falls. What can I do on the outside of my home?  Regularly fix the edges of walkways and driveways and fix any cracks.  Remove anything that might make you trip as you walk through a door, such as a raised step or threshold.  Trim any bushes or trees on the path to your home.  Use bright outdoor lighting.  Clear any walking paths of anything that might make someone trip, such as rocks or tools.  Regularly check to see if handrails are loose or broken. Make sure that both sides of any steps have handrails.  Any raised decks  and porches should have guardrails on the edges.  Have any leaves, snow, or ice cleared regularly.  Use sand or salt on walking paths during winter.  Clean up any spills in your garage right away. This includes oil or grease spills. What can I do in the bathroom?  Use night lights.  Install grab bars by the toilet and in the tub and shower. Do not use towel bars as grab bars.  Use non-skid mats or decals in the tub or shower.  If you need to sit down in the shower, use a plastic, non-slip stool.  Keep the floor dry. Clean up any water that spills on the floor as soon as it happens.  Remove soap buildup in the tub or shower regularly.  Attach bath mats securely with double-sided non-slip rug tape.  Do not have throw rugs and other things on the floor that can make you trip. What can I do in the bedroom?  Use night lights.  Make sure that you have a light by your bed that is easy to reach.  Do not use any sheets or blankets that are too big for your bed. They should  not hang down onto the floor.  Have a firm chair that has side arms. You can use this for support while you get dressed.  Do not have throw rugs and other things on the floor that can make you trip. What can I do in the kitchen?  Clean up any spills right away.  Avoid walking on wet floors.  Keep items that you use a lot in easy-to-reach places.  If you need to reach something above you, use a strong step stool that has a grab bar.  Keep electrical cords out of the way.  Do not use floor polish or wax that makes floors slippery. If you must use wax, use non-skid floor wax.  Do not have throw rugs and other things on the floor that can make you trip. What can I do with my stairs?  Do not leave any items on the stairs.  Make sure that there are handrails on both sides of the stairs and use them. Fix handrails that are broken or loose. Make sure that handrails are as long as the stairways.  Check any  carpeting to make sure that it is firmly attached to the stairs. Fix any carpet that is loose or worn.  Avoid having throw rugs at the top or bottom of the stairs. If you do have throw rugs, attach them to the floor with carpet tape.  Make sure that you have a light switch at the top of the stairs and the bottom of the stairs. If you do not have them, ask someone to add them for you. What else can I do to help prevent falls?  Wear shoes that:  Do not have high heels.  Have rubber bottoms.  Are comfortable and fit you well.  Are closed at the toe. Do not wear sandals.  If you use a stepladder:  Make sure that it is fully opened. Do not climb a closed stepladder.  Make sure that both sides of the stepladder are locked into place.  Ask someone to hold it for you, if possible.  Clearly mark and make sure that you can see:  Any grab bars or handrails.  First and last steps.  Where the edge of each step is.  Use tools that help you move around (mobility aids) if they are needed. These include:  Canes.  Walkers.  Scooters.  Crutches.  Turn on the lights when you go into a dark area. Replace any light bulbs as soon as they burn out.  Set up your furniture so you have a clear path. Avoid moving your furniture around.  If any of your floors are uneven, fix them.  If there are any pets around you, be aware of where they are.  Review your medicines with your doctor. Some medicines can make you feel dizzy. This can increase your chance of falling. Ask your doctor what other things that you can do to help prevent falls. This information is not intended to replace advice given to you by your health care provider. Make sure you discuss any questions you have with your health care provider. Document Released: 01/21/2009 Document Revised: 09/02/2015 Document Reviewed: 05/01/2014 Elsevier Interactive Patient Education  2017 [employer].

## 2020-03-15 ENCOUNTER — Encounter: Payer: Self-pay | Admitting: Dermatology

## 2020-03-15 ENCOUNTER — Other Ambulatory Visit: Payer: Self-pay

## 2020-03-15 ENCOUNTER — Ambulatory Visit: Payer: Medicare HMO | Admitting: Dermatology

## 2020-03-15 DIAGNOSIS — L814 Other melanin hyperpigmentation: Secondary | ICD-10-CM

## 2020-03-15 DIAGNOSIS — L578 Other skin changes due to chronic exposure to nonionizing radiation: Secondary | ICD-10-CM | POA: Diagnosis not present

## 2020-03-15 DIAGNOSIS — Z85828 Personal history of other malignant neoplasm of skin: Secondary | ICD-10-CM | POA: Diagnosis not present

## 2020-03-15 DIAGNOSIS — L820 Inflamed seborrheic keratosis: Secondary | ICD-10-CM | POA: Diagnosis not present

## 2020-03-15 DIAGNOSIS — L821 Other seborrheic keratosis: Secondary | ICD-10-CM

## 2020-03-15 NOTE — Progress Notes (Signed)
   Follow-Up Visit   Subjective  Sarah Perry is a 70 y.o. female who presents for the following: Follow-up (AKs, Hx BCCs) and spots (left chest, left calf - picks at).  She had Mohs on her chin for [facility], also her nose.   The following portions of the chart were reviewed this encounter and updated as appropriate:      Review of Systems:  No other skin or systemic complaints except as noted in HPI or Assessment and Plan.  Objective  Well appearing patient in no apparent distress; mood and affect are within normal limits.  A focused examination was performed including face, chest, legs. Relevant physical exam findings are noted in the Assessment and Plan.  Objective  L calf x 1, L clavicle x 1, L post shoulder x 1 (3): Erythematous keratotic or waxy stuck-on papule   Objective  R clavicle, R mid chin, nose: Well healed scar with no evidence of recurrence. Prominent round scar in area of skin graft of the mid nasal dorsum.   Assessment & Plan   Actinic Damage - chronic, secondary to cumulative UV radiation exposure/sun exposure over time - diffuse scaly erythematous macules with underlying dyspigmentation - Recommend daily broad spectrum sunscreen SPF 30+ to sun-exposed areas, reapply every 2 hours as needed.  - Call for new or changing lesions. History of Basal Cell Carcinoma of the Skin - No evidence of recurrence today - Recommend regular full body skin exams - Recommend daily broad spectrum sunscreen SPF 30+ to sun-exposed areas, reapply every 2 hours as needed.  - Call if any new or changing lesions are noted between office visits  Seborrheic Keratoses - Stuck-on, waxy, tan-brown papules and plaques  - Discussed benign etiology and prognosis. - Observe - Call for any changes  Lentigines - Scattered tan macules - Discussed due to sun exposure - Benign, observe - Recommend daily broad spectrum sunscreen SPF 30+ to sun-exposed areas, reapply every 2 hours as needed. -  Call for any changes   Inflamed seborrheic keratosis (3) L calf x 1, L clavicle x 1, L post shoulder x 1  Destruction of lesion - L calf x 1, L clavicle x 1, L post shoulder x 1  Destruction method: cryotherapy   Informed consent: discussed and consent obtained   Lesion destroyed using liquid nitrogen: Yes   Region frozen until ice ball extended beyond lesion: Yes   Outcome: patient tolerated procedure well with no complications   Post-procedure details: wound care instructions given    History of basal cell carcinoma (BCC) R clavicle, R mid chin, nose  Clear. Observe for recurrence. Call clinic for new or changing lesions.  Recommend regular skin exams, daily broad-spectrum spf 30+ sunscreen use, and photoprotection.     Return in about 6 months (around 09/13/2020) for TBSE.   IJamesetta Orleans, CMA, am acting as scribe for Brendolyn Patty, MD .  Documentation: I have reviewed the above documentation for accuracy and completeness, and I agree with the above.  Brendolyn Patty MD

## 2020-03-15 NOTE — Patient Instructions (Signed)
Cryotherapy Aftercare  . Wash gently with soap and water everyday.   . Apply Vaseline and Band-Aid daily until healed.  

## 2020-03-17 DIAGNOSIS — C44319 Basal cell carcinoma of skin of other parts of face: Secondary | ICD-10-CM | POA: Diagnosis not present

## 2020-03-17 DIAGNOSIS — C44311 Basal cell carcinoma of skin of nose: Secondary | ICD-10-CM | POA: Diagnosis not present

## 2020-04-23 ENCOUNTER — Other Ambulatory Visit: Payer: Self-pay | Admitting: Neurosurgery

## 2020-04-23 DIAGNOSIS — D329 Benign neoplasm of meninges, unspecified: Secondary | ICD-10-CM

## 2020-05-05 ENCOUNTER — Encounter: Payer: Self-pay | Admitting: Internal Medicine

## 2020-05-26 ENCOUNTER — Ambulatory Visit
Admission: RE | Admit: 2020-05-26 | Discharge: 2020-05-26 | Disposition: A | Discharge status: Home / Self Care | Payer: Medicare HMO | Source: Ambulatory Visit | Attending: Neurosurgery | Admitting: Neurosurgery

## 2020-05-26 ENCOUNTER — Other Ambulatory Visit: Payer: Self-pay

## 2020-05-26 DIAGNOSIS — G9389 Other specified disorders of brain: Secondary | ICD-10-CM | POA: Diagnosis not present

## 2020-05-26 DIAGNOSIS — D329 Benign neoplasm of meninges, unspecified: Secondary | ICD-10-CM | POA: Diagnosis not present

## 2020-05-26 DIAGNOSIS — D496 Neoplasm of unspecified behavior of brain: Secondary | ICD-10-CM | POA: Diagnosis not present

## 2020-05-26 DIAGNOSIS — J3489 Other specified disorders of nose and nasal sinuses: Secondary | ICD-10-CM | POA: Diagnosis not present

## 2020-05-26 DIAGNOSIS — D320 Benign neoplasm of cerebral meninges: Secondary | ICD-10-CM | POA: Diagnosis not present

## 2020-05-26 MED ORDER — GADOBUTROL 1 MMOL/ML IV SOLN
7.5000 mL | Freq: Once | INTRAVENOUS | Status: AC | PRN
  Administered 2020-05-26: 7.5 mL via INTRAVENOUS

## 2020-06-01 DIAGNOSIS — D329 Benign neoplasm of meninges, unspecified: Secondary | ICD-10-CM | POA: Diagnosis not present

## 2020-06-11 DIAGNOSIS — H40053 Ocular hypertension, bilateral: Secondary | ICD-10-CM | POA: Diagnosis not present

## 2020-06-21 DIAGNOSIS — E038 Other specified hypothyroidism: Secondary | ICD-10-CM | POA: Diagnosis not present

## 2020-06-21 DIAGNOSIS — D352 Benign neoplasm of pituitary gland: Secondary | ICD-10-CM | POA: Diagnosis not present

## 2020-06-25 DIAGNOSIS — H40053 Ocular hypertension, bilateral: Secondary | ICD-10-CM | POA: Diagnosis not present

## 2020-06-28 DIAGNOSIS — D352 Benign neoplasm of pituitary gland: Secondary | ICD-10-CM | POA: Diagnosis not present

## 2020-06-28 DIAGNOSIS — E038 Other specified hypothyroidism: Secondary | ICD-10-CM | POA: Diagnosis not present

## 2020-07-14 ENCOUNTER — Encounter: Payer: Self-pay | Admitting: Internal Medicine

## 2020-09-07 ENCOUNTER — Ambulatory Visit (INDEPENDENT_AMBULATORY_CARE_PROVIDER_SITE_OTHER): Payer: Medicare HMO | Admitting: Internal Medicine

## 2020-09-07 ENCOUNTER — Other Ambulatory Visit: Payer: Self-pay

## 2020-09-07 ENCOUNTER — Encounter: Payer: Self-pay | Admitting: Internal Medicine

## 2020-09-07 VITALS — BP 122/76 | HR 64 | Temp 98.1°F | Resp 16 | Ht 64.0 in | Wt 165.6 lb

## 2020-09-07 DIAGNOSIS — N951 Menopausal and female climacteric states: Secondary | ICD-10-CM

## 2020-09-07 DIAGNOSIS — R739 Hyperglycemia, unspecified: Secondary | ICD-10-CM

## 2020-09-07 DIAGNOSIS — D329 Benign neoplasm of meninges, unspecified: Secondary | ICD-10-CM

## 2020-09-07 DIAGNOSIS — Z1231 Encounter for screening mammogram for malignant neoplasm of breast: Secondary | ICD-10-CM | POA: Diagnosis not present

## 2020-09-07 DIAGNOSIS — K219 Gastro-esophageal reflux disease without esophagitis: Secondary | ICD-10-CM

## 2020-09-07 NOTE — Progress Notes (Signed)
Patient ID: Sarah Perry, female   DOB: [birth year], 70 y.o.   MRN: [id number]   Subjective:    Patient ID: Sarah Perry, female    DOB: 1950-05-20, 70 y.o.   MRN: [id number]  HPI This visit occurred during the SARS-CoV-2 public health emergency.  Safety protocols were in place, including screening questions prior to the visit, additional usage of staff PPE, and extensive cleaning of exam room while observing appropriate contact time as indicated for disinfecting solutions.  Patient here for a scheduled follow up.  She is doing well.  Feels good. Stays active.  No chest pain or sob with increased activity or exertion.  She is walking. Silver Sneakers.  Eating.  No nausea or vomiting.  Bowels moving.  Being followed by [facility] for known cavernous sinus meningioma. F/u MRI - stable.  Wants to hold on surgery.  Recommended f/u with MRI in one year.  Saw Dr Tedd Sias 06/2010.  Persistent stable hyperprolactinemia.  No treatment warranted.  Subclinical hypothyroidism.  No treatment warranted at this time.  Recommended f/u in 6 months.    Past Medical History:  Diagnosis Date  . Basal cell carcinoma 12/08/2014   L spinal mid back   . Basal cell carcinoma 12/08/2014   R spinal upper back   . Basal cell carcinoma 12/08/2014   L upper sternum   . Basal cell carcinoma 02/11/2018   L med pretibial   . Basal cell carcinoma 02/11/2018   L lower pretibial   . Basal cell carcinoma 09/17/2018   R shoulder   . Basal cell carcinoma 09/17/2018   L lat pretibial   . Basal cell carcinoma 02/18/2019   R parietal scalp   . Basal cell carcinoma 02/28/2019   Central nasal tip   . Basal cell carcinoma 09/23/2019   R mid chin   . Basal cell carcinoma 09/23/2019   R clavicle   . Degenerative arthritis    hips, knees  . GERD (gastroesophageal reflux disease)   . Varicella zoster 10/09   Past Surgical History:  Procedure Laterality Date  . BASAL CELL CARCINOMA EXCISION    . DILATION AND CURETTAGE OF UTERUS    .  TONSILECTOMY/ADENOIDECTOMY WITH MYRINGOTOMY     Family History  Problem Relation Age of Onset  . Ovarian cancer Mother   . Hypercholesterolemia Mother   . Dementia Father   . Prostate cancer Father   . Breast cancer Paternal Aunt   . Pancreatic cancer Maternal Aunt   . Melanoma Sister    Social History   Socioeconomic History  . Marital status: Married    Spouse name: Not on file  . Number of children: 2  . Years of education: Not on file  . Highest education level: Not on file  Occupational History  . Not on file  Tobacco Use  . Smoking status: Never Smoker  . Smokeless tobacco: Never Used  Vaping Use  . Vaping Use: Never used  Substance and Sexual Activity  . Alcohol use: Yes    Alcohol/week: 0.0 standard drinks    Comment: glass of wine daily  . Drug use: No  . Sexual activity: Not on file  Other Topics Concern  . Not on file  Social History Narrative  . Not on file   Social Determinants of Health   Financial Resource Strain: Low Risk   . Difficulty of Paying Living Expenses: Not hard at all  Food Insecurity: No Food Insecurity  . Worried About Radiation protection practitioner  of Food in the Last Year: Never true  . Ran Out of Food in the Last Year: Never true  Transportation Needs: No Transportation Needs  . Lack of Transportation (Medical): No  . Lack of Transportation (Non-Medical): No  Physical Activity: Sufficiently Active  . Days of Exercise per Week: 5 days  . Minutes of Exercise per Session: 30 min  Stress: No Stress Concern Present  . Feeling of Stress : Not at all  Social Connections: Not on file    Outpatient Encounter Medications as of 09/07/2020  Medication Sig  . aspirin 81 MG EC tablet Take 1 tablet by mouth daily.  . calcium-vitamin D (CALCIUM 500+D) 500-400 MG-UNIT per tablet Take 1 tablet by mouth daily.   Marland Kitchen LUMIGAN 0.01 % SOLN SMARTSIG:In Eye(s)  . Magnesium Oxide 400 (240 Mg) MG TABS   . Multiple Vitamin (MULTIVITAMIN) tablet Take 1 tablet by mouth  daily.  Marland Kitchen omeprazole (PRILOSEC) 20 MG capsule Take 1 capsule (20 mg total) by mouth daily.  Marland Kitchen venlafaxine XR (EFFEXOR XR) 75 MG 24 hr capsule Take 1 capsule (75 mg total) by mouth daily.  . [DISCONTINUED] acyclovir ointment (ZOVIRAX) 5 % Apply to affected area on lip qid. (Patient not taking: Reported on 09/23/2019)  . [DISCONTINUED] aspirin 81 MG chewable tablet Chew 1 tablet by mouth daily.  . [DISCONTINUED] TRAVOPROST OP    No facility-administered encounter medications on file as of 09/07/2020.    Review of Systems  Constitutional: Negative for appetite change and unexpected weight change.  HENT: Negative for congestion and sinus pressure.   Respiratory: Negative for cough, chest tightness and shortness of breath.   Cardiovascular: Negative for chest pain, palpitations and leg swelling.  Gastrointestinal: Negative for abdominal pain, diarrhea, nausea and vomiting.  Genitourinary: Negative for difficulty urinating and dysuria.  Musculoskeletal: Negative for joint swelling and myalgias.  Skin: Negative for color change and rash.  Neurological: Negative for dizziness, light-headedness and headaches.  Psychiatric/Behavioral: Negative for agitation and dysphoric mood.       Objective:    Physical Exam Vitals reviewed.  Constitutional:      General: She is not in acute distress.    Appearance: Normal appearance.  HENT:     Head: Normocephalic and atraumatic.     Right Ear: External ear normal.     Left Ear: External ear normal.  Eyes:     General: No scleral icterus.       Right eye: No discharge.        Left eye: No discharge.     Conjunctiva/sclera: Conjunctivae normal.  Neck:     Thyroid: No thyromegaly.  Cardiovascular:     Rate and Rhythm: Normal rate and regular rhythm.  Pulmonary:     Effort: No respiratory distress.     Breath sounds: Normal breath sounds. No wheezing.  Abdominal:     General: Bowel sounds are normal.     Palpations: Abdomen is soft.      Tenderness: There is no abdominal tenderness.  Musculoskeletal:        General: No swelling or tenderness.     Cervical back: Neck supple. No tenderness.  Lymphadenopathy:     Cervical: No cervical adenopathy.  Skin:    Findings: No erythema or rash.  Neurological:     Mental Status: She is alert.  Psychiatric:        Mood and Affect: Mood normal.        Behavior: Behavior normal.     BP  122/76   Pulse 64   Temp 98.1 F (36.7 C)   Resp 16   Ht 5\' 4"  (1.626 m)   Wt 165 lb 9.6 oz (75.1 kg)   SpO2 99%   BMI 28.43 kg/m  Wt Readings from Last 3 Encounters:  09/07/20 165 lb 9.6 oz (75.1 kg)  03/12/20 162 lb (73.5 kg)  03/08/20 162 lb 6.4 oz (73.7 kg)     Lab Results  Component Value Date   WBC 4.8 03/08/2020   HGB 12.7 03/08/2020   HCT 38.0 03/08/2020   PLT 223.0 03/08/2020   GLUCOSE 98 03/08/2020   CHOL 218 (H) 03/08/2020   TRIG 80.0 03/08/2020   HDL 95.40 03/08/2020   LDLCALC 107 (H) 03/08/2020   ALT 16 03/08/2020   AST 24 03/08/2020   NA 141 03/08/2020   K 4.5 03/08/2020   CL 103 03/08/2020   CREATININE 0.68 03/08/2020   BUN 21 03/08/2020   CO2 32 03/08/2020   TSH 3.06 03/08/2020   INR 0.9 10/24/2018   HGBA1C 5.7 03/08/2020    MR BRAIN W WO CONTRAST  Result Date: 05/27/2020 CLINICAL DATA:  70 year old female with a history of infiltrating skull base mass, meningioma. EXAM: MRI HEAD WITHOUT AND WITH CONTRAST TECHNIQUE: Multiplanar, multiecho pulse sequences of the brain and surrounding structures were obtained without and with intravenous contrast. CONTRAST:  7.34mL GADAVIST GADOBUTROL 1 MMOL/ML IV SOLN COMPARISON:  Brain MRI 05/01/2019, 10/29/2018, 10/24/2018. FINDINGS: Brain: Infiltrative, homogeneously enhancing right skull base mass with lobulated margins from the sella turcica through the right cavernous sinus, extending to the right orbital apex, and with a broad-based component tracking along the right tentorium. Tumor fills the sella. Distinct lobulated  right planum sphenoidale component redemonstrated. The most confluent component of the mass encompasses 37 x 32 by 30 mm. Broad-based dural thickening also continues along the right middle cranial fossa and dorsal clivus to the right internal auditory canal (series 18, image 40). Tumor extends to the right foramen ovale, and chronically infiltrates the right sphenoid sinus (series 18, image 49). Tumor and mass effect at the right orbital apex remain stable. Overall the lesion has not significantly changed in size or configuration since July 2020. No convincing cerebral edema is associated. Effaced prepontine cistern has not significantly changed. No midline shift. Stable ventricle size and configuration. No ventriculomegaly. No superimposed restricted diffusion to suggest acute infarction. No acute intracranial hemorrhage. Cervicomedullary junction remains normal. Stable gray and white matter signal throughout the brain. Scattered small mostly subcortical white matter T2 and FLAIR hyperintensities. 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 No cortical encephalomalacia or chronic cerebral blood products. No new dural thickening or enhancement. Vascular: Major intracranial vascular flow voids are stable, including the right ICA siphon when compared to July 2020. The major dural venous sinuses are enhancing and appear to be patent with the exception of the right cavernous sinus which is chronically infiltrated (series 17, image 18). Skull and upper cervical spine: Preserved normal bone marrow signal along the margins of the chronic skull base meningioma. Calvarium marrow signal is stable and within normal limits. Negative visible cervical spine. Sinuses/Orbits: Chronically abnormal right orbital apex and right sphenoid sinus as stated above. Mild right exophthalmos may have slightly increased since 2020. Remaining paranasal sinuses and left orbit are stable, negative (mild maxillary sinus mucosal  thickening). Other: Stable visible internal auditory structures, chronic meningioma associated enhancement in the right internal auditory canal. Mastoids remain clear. Negative scalp. IMPRESSION: 1. Chronic Infiltrative Right  Skull Base Meningioma appears stable and size and configuration since July 2020. The most confluent component of tumor is 3 to 3.7 cm diameter. Notable involvement of the Right Orbital Apex (see #2), Right Cavernous Sinus, Right Sphenoid Sinus, Sella Turcica, Right Internal Auditory Canal. 2. Questionably increased mild right exophthalmos since 2020, but tumor and mass effect at the right orbital apex appears stable. 3. No new intracranial abnormality. Electronically Signed   By: Odessa Fleming M.D.   On: 05/27/2020 09:15       Assessment & Plan:   Problem List Items Addressed This Visit    GERD (gastroesophageal reflux disease)    No upper symptoms reported.  On prilosec.       Hyperglycemia    Low carb diet and exercise.  Follow met b and a1c.       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  Has been evaluated by neurosurgery, endocrinology and ophthalmology.  Just evaluated by Dr Adriana Simas 05/2020.  MRI - stable.  Recommended f/u in one year.  Seeing Dr Tedd Sias as outlined.  Recommended 6 month f/u.        Menopausal syndrome    Doing well on effexor.  Follow.         Other Visit Diagnoses    Visit for screening mammogram    -  Primary   Relevant Orders   MM 3D SCREEN BREAST BILATERAL       Dale Duck, MD

## 2020-09-12 ENCOUNTER — Encounter: Payer: Self-pay | Admitting: Internal Medicine

## 2020-09-12 NOTE — Assessment & Plan Note (Addendum)
No upper symptoms reported. On prilosec.  

## 2020-09-12 NOTE — Assessment & Plan Note (Signed)
Doing well on effexor.  Follow.

## 2020-09-12 NOTE — Assessment & Plan Note (Signed)
Low carb diet and exercise.  Follow met b and a1c.  

## 2020-09-12 NOTE — Assessment & Plan Note (Signed)
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  Has been evaluated by neurosurgery, endocrinology and ophthalmology.  Just evaluated by Dr Lacinda Axon 05/2020.  MRI - stable.  Recommended f/u in one year.  Seeing Dr Gabriel Carina as outlined.  Recommended 6 month f/u.

## 2020-09-20 ENCOUNTER — Other Ambulatory Visit: Payer: Self-pay | Admitting: Internal Medicine

## 2020-09-21 ENCOUNTER — Encounter: Payer: Medicare HMO | Admitting: Dermatology

## 2020-10-08 DIAGNOSIS — C4491 Basal cell carcinoma of skin, unspecified: Secondary | ICD-10-CM

## 2020-10-08 HISTORY — DX: Basal cell carcinoma of skin, unspecified: C44.91

## 2020-10-14 ENCOUNTER — Ambulatory Visit: Payer: Medicare HMO | Admitting: Dermatology

## 2020-10-14 ENCOUNTER — Other Ambulatory Visit: Payer: Self-pay

## 2020-10-14 DIAGNOSIS — Z1283 Encounter for screening for malignant neoplasm of skin: Secondary | ICD-10-CM

## 2020-10-14 DIAGNOSIS — L820 Inflamed seborrheic keratosis: Secondary | ICD-10-CM

## 2020-10-14 DIAGNOSIS — D229 Melanocytic nevi, unspecified: Secondary | ICD-10-CM

## 2020-10-14 DIAGNOSIS — C44319 Basal cell carcinoma of skin of other parts of face: Secondary | ICD-10-CM

## 2020-10-14 DIAGNOSIS — D1800 Hemangioma unspecified site: Secondary | ICD-10-CM | POA: Diagnosis not present

## 2020-10-14 DIAGNOSIS — L821 Other seborrheic keratosis: Secondary | ICD-10-CM | POA: Diagnosis not present

## 2020-10-14 DIAGNOSIS — L814 Other melanin hyperpigmentation: Secondary | ICD-10-CM | POA: Diagnosis not present

## 2020-10-14 DIAGNOSIS — L578 Other skin changes due to chronic exposure to nonionizing radiation: Secondary | ICD-10-CM | POA: Diagnosis not present

## 2020-10-14 DIAGNOSIS — Z85828 Personal history of other malignant neoplasm of skin: Secondary | ICD-10-CM

## 2020-10-14 DIAGNOSIS — D485 Neoplasm of uncertain behavior of skin: Secondary | ICD-10-CM

## 2020-10-14 DIAGNOSIS — C4491 Basal cell carcinoma of skin, unspecified: Secondary | ICD-10-CM

## 2020-10-14 HISTORY — DX: Basal cell carcinoma of skin, unspecified: C44.91

## 2020-10-14 NOTE — Progress Notes (Signed)
Follow-Up Visit   Subjective  Sarah Perry is a 70 y.o. female who presents for the following: Annual Exam (Hx BCC,  patient has noticed new skin lesions on her forehead and L cheek that she would like checked today.).  The following portions of the chart were reviewed this encounter and updated as appropriate:       Review of Systems:  No other skin or systemic complaints except as noted in HPI or Assessment and Plan.  Objective  Well appearing patient in no apparent distress; mood and affect are within normal limits.  A full examination was performed including scalp, head, eyes, ears, nose, lips, neck, chest, axillae, abdomen, back, buttocks, bilateral upper extremities, bilateral lower extremities, hands, feet, fingers, toes, fingernails, and toenails. All findings within normal limits unless otherwise noted below.   Assessment & Plan  Neoplasm of uncertain behavior of skin (3) Upper forehead  Skin / nail biopsy Type of biopsy: tangential   Informed consent: discussed and consent obtained   Anesthesia: the lesion was anesthetized in a standard fashion   Anesthesia comment:  Area prepped with alcohol Anesthetic:  1% lidocaine w/ epinephrine 1-100,000 buffered w/ 8.4% NaHCO3 Instrument used: flexible razor blade   Hemostasis achieved with: pressure and aluminum chloride   Outcome: patient tolerated procedure well   Post-procedure details: wound care instructions given   Post-procedure details comment:  Ointment and small bandage applied  Specimen 1 - Surgical pathology Differential Diagnosis: D48.5 r/o sebaceous hyperplasia vs BCC  Check Margins: No 0.4 cm pink pearly papule.  L preauricular  Skin / nail biopsy Type of biopsy: tangential   Informed consent: discussed and consent obtained   Anesthesia: the lesion was anesthetized in a standard fashion   Anesthesia comment:  Area prepped with alcohol Anesthetic:  1% lidocaine w/ epinephrine 1-100,000 buffered w/ 8.4%  NaHCO3 Instrument used: flexible razor blade   Hemostasis achieved with: pressure and aluminum chloride   Outcome: patient tolerated procedure well   Post-procedure details: wound care instructions given   Post-procedure details comment:  Ointment and small bandage applied  Specimen 2 - Surgical pathology Differential Diagnosis: D48.5 r/o ISK vs BCC Check Margins: No 0.5 cm pink flesh papule.  R malar cheek  Skin / nail biopsy Type of biopsy: tangential   Informed consent: discussed and consent obtained   Anesthesia: the lesion was anesthetized in a standard fashion   Anesthesia comment:  Area prepped with alcohol Anesthetic:  1% lidocaine w/ epinephrine 1-100,000 buffered w/ 8.4% NaHCO3 Instrument used: flexible razor blade   Hemostasis achieved with: pressure and aluminum chloride   Outcome: patient tolerated procedure well   Post-procedure details: wound care instructions given   Post-procedure details comment:  Ointment and small bandage applied  Specimen 3 - Surgical pathology Differential Diagnosis: D48.5 r/o sebaceous hyperplasia vs BCC Check Margins: No 0.5 cm pink pearly papule.  Discussed ED&C here in the office vs excision here in the office vs MOHS with Dr. Lacinda Axon. Patient prefers MO[facility] to the upper forehead and R malar cheek. Plan to tx L preauricular here in the office.   Hemangioma, unspecified site Inf R med nostril  Benign-appearing.  Observation.  Call clinic for new or changing lesions.  Recommend daily use of broad spectrum spf 30+ sunscreen to sun-exposed areas.    Inflamed seborrheic keratosis R med post shoulder x 1, L elbow x 1, R frontal hairline x 1  Vrs AK vrs early BCC  Recheck at follow up appointment  Destruction of lesion - R med post shoulder x 1, L elbow x 1, R frontal hairline x 1  Destruction method: cryotherapy   Informed consent: discussed and consent obtained   Timeout:  patient name, date of birth, surgical site, and procedure  verified Lesion destroyed using liquid nitrogen: Yes   Region frozen until ice ball extended beyond lesion: Yes   Outcome: patient tolerated procedure well with no complications   Post-procedure details: wound care instructions given   Additional details:  Prior to procedure, discussed risks of blister formation, small wound, skin dyspigmentation, or rare scar following cryotherapy. Recommend Vaseline ointment to treated areas while healing.   Lentigines - Scattered tan macules - Due to sun exposure - Benign-appering, observe - Recommend daily broad spectrum sunscreen SPF 30+ to sun-exposed areas, reapply every 2 hours as needed. - Call for any changes  Seborrheic Keratoses - Stuck-on, waxy, tan-brown papules and/or plaques  - Benign-appearing - Discussed benign etiology and prognosis. - Observe - Call for any changes  Melanocytic Nevi - Tan-brown and/or pink-flesh-colored symmetric macules and papules - Benign appearing on exam today - Observation - Call clinic for new or changing moles - Recommend daily use of broad spectrum spf 30+ sunscreen to sun-exposed areas.   Hemangiomas - Red papules - Discussed benign nature - Observe - Call for any changes  Actinic Damage - Chronic condition, secondary to cumulative UV/sun exposure - diffuse scaly erythematous macules with underlying dyspigmentation - Recommend daily broad spectrum sunscreen SPF 30+ to sun-exposed areas, reapply every 2 hours as needed.  - Staying in the shade or wearing long sleeves, sun glasses (UVA+UVB protection) and wide brim hats (4-inch brim around the entire circumference of the hat) are also recommended for sun protection.  - Call for new or changing lesions.  History of Basal Cell Carcinoma of the Skin - No evidence of recurrence today, nose, chin, R clavicle, R shoulder - Recommend regular full body skin exams - Recommend daily broad spectrum sunscreen SPF 30+ to sun-exposed areas, reapply every 2  hours as needed.  - Call if any new or changing lesions are noted between office visits  Skin cancer screening performed today.  Return in about 1 year (around 10/14/2021) for TBSE, 6 mos sun-exposed areas.  Luther Redo, CMA, am acting as scribe for Brendolyn Patty, MD .  Documentation: I have reviewed the above documentation for accuracy and completeness, and I agree with the above.  Brendolyn Patty MD

## 2020-10-14 NOTE — Patient Instructions (Addendum)
If you have any questions or concerns for your doctor, please call our main line at [phone] and press option 4 to reach your doctor's medical assistant. If no one answers, please leave a voicemail as directed and we will return your call as soon as possible. Messages left after 4 pm will be answered the following business day.   You may also send us a message via MyChart. We typically respond to MyChart messages within 1-2 business days.  For prescription refills, please ask your pharmacy to contact our office. Our fax number is [phone].  If you have an urgent issue when the clinic is closed that cannot wait until the next business day, you can page your doctor at the number below.    Please note that while we do our best to be available for urgent issues outside of office hours, we are not available 24/7.   If you have an urgent issue and are unable to reach us, you may choose to seek medical care at your doctor's office, retail clinic, urgent care center, or emergency room.  If you have a medical emergency, please immediately call 911 or go to the emergency department.  Pager Numbers  - Dr. Kowalski: [phone]  - Dr. Moye: [phone]  - Dr. Stewart: [phone]  In the event of inclement weather, please call our main line at [phone] for an update on the status of any delays or closures.  Dermatology Medication Tips: Please keep the boxes that topical medications come in in order to help keep track of the instructions about where and how to use these. Pharmacies typically print the medication instructions only on the boxes and not directly on the medication tubes.   If your medication is too expensive, please contact our office at [phone] option 4 or send us a message through MyChart.   We are unable to tell what your co-pay for medications will be in advance as this is different depending on your insurance coverage. However, we may be able to find a substitute  medication at lower cost or fill out paperwork to get insurance to cover a needed medication.   If a prior authorization is required to get your medication covered by your insurance company, please allow us 1-2 business days to complete this process.  Drug prices often vary depending on where the prescription is filled and some pharmacies may offer cheaper prices.  The website www.goodrx.com contains coupons for medications through different pharmacies. The prices here do not account for what the cost may be with help from insurance (it may be cheaper with your insurance), but the website can give you the price if you did not use any insurance.  - You can print the associated coupon and take it with your prescription to the pharmacy.  - You may also stop by our office during regular business hours and pick up a GoodRx coupon card.  - If you need your prescription sent electronically to a different pharmacy, notify our office through  MyChart or by phone at [phone] option 4.   Wound Care Instructions  Cleanse wound gently with soap and water once a day then pat dry with clean gauze. Apply a thing coat of Petrolatum (petroleum jelly, "Vaseline") over the wound (unless you have an allergy to this). We recommend that you use a new, sterile tube of Vaseline. Do not pick or remove scabs. Do not remove the yellow or white "healing tissue" from the base of the wound.  Cover the   wound with fresh, clean, nonstick gauze and secure with paper tape. You may use Band-Aids in place of gauze and tape if the would is small enough, but would recommend trimming much of the tape off as there is often too much. Sometimes Band-Aids can irritate the skin.  You should call the office for your biopsy report after 1 week if you have not already been contacted.  If you experience any problems, such as abnormal amounts of bleeding, swelling, significant bruising, significant pain, or evidence of infection,  please call the office immediately.  FOR ADULT SURGERY PATIENTS: If you need something for pain relief you may take 1 extra strength Tylenol (acetaminophen) AND 2 Ibuprofen (200mg each) together every 4 hours as needed for pain. (do not take these if you are allergic to them or if you have a reason you should not take them.) Typically, you may only need pain medication for 1 to 3 days.    

## 2020-10-21 ENCOUNTER — Telehealth: Payer: Self-pay

## 2020-10-21 ENCOUNTER — Other Ambulatory Visit: Payer: Self-pay

## 2020-10-21 DIAGNOSIS — C44319 Basal cell carcinoma of skin of other parts of face: Secondary | ICD-10-CM

## 2020-10-21 NOTE — Telephone Encounter (Signed)
-----   Message from Brendolyn Patty, MD sent at 10/21/2020  3:03 PM EDT ----- 1. Skin , upper forehead BASAL CELL CARCINOMA, NODULAR PATTERN 2. Skin , L preauricular BASAL CELL CARCINOMA, NODULAR PATTERN 3. Skin , R malar cheek BASAL CELL CARCINOMA, NODULAR PATTERN  1 and 3. BCC skin cancer upper forehead and R malar cheek- pt prefers Mohs at [facility] 2.. BCC skin cancer- excision here in office- L preauricular - please call pt

## 2020-10-24 IMAGING — MR MRI HEAD WITHOUT CONTRAST
12 series · 43 of 48 positions shown · non-contrast
Comparison: CT head 10/24/2018

CLINICAL DATA: Left facial droop and weakness.  Headache.

EXAM:
MRI HEAD WITHOUT CONTRAST
TECHNIQUE: Multiplanar, multiecho pulse sequences of the brain and surrounding
structures were obtained without intravenous contrast.

[Series 5: ax dwi_tracew · axial · 3.0mm · 0.60mm/px · z∈[-98,+63]mm · 4 of 55 slices shown]
[im 1/55]
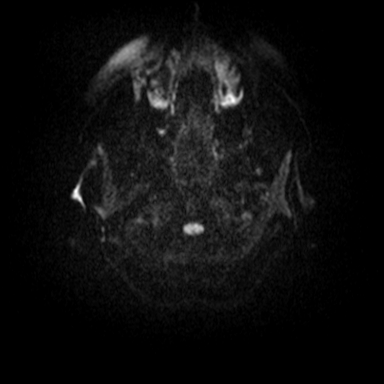
[im 19/55]
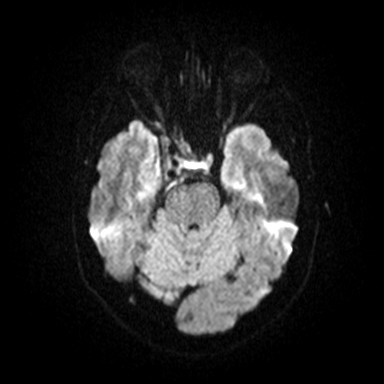
[im 37/55]
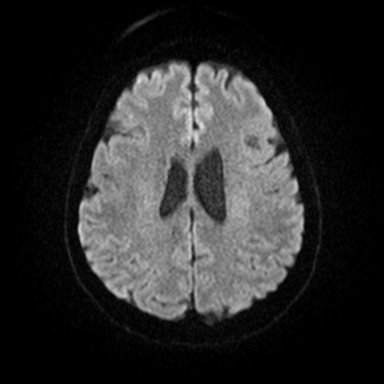
[im 55/55]
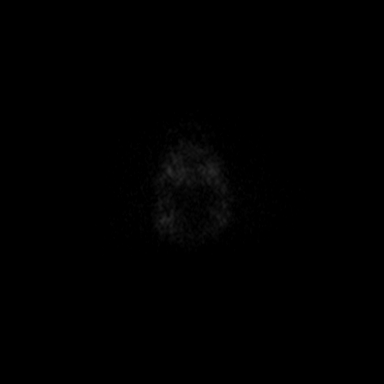

[Series 6: ax dwi_adc · axial · 3.0mm · 0.60mm/px · z∈[-98,+63]mm · 4 of 55 slices shown]
[im 1/55]
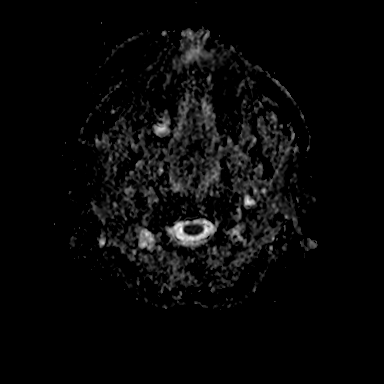
[im 19/55]
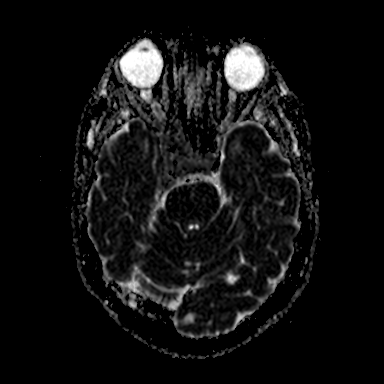
[im 37/55]
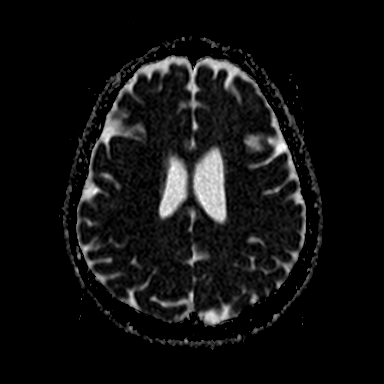
[im 55/55]
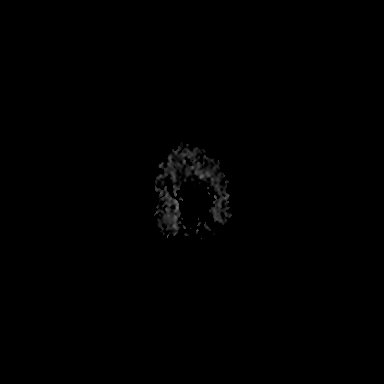

[Series 7: cor dwi_tracew · coronal · 5.0mm · 0.60mm/px · 3 of 39 slices shown]
[im 1/39]
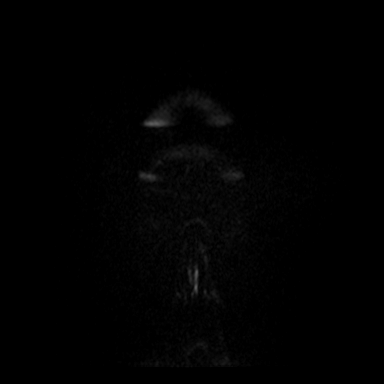
[im 20/39]
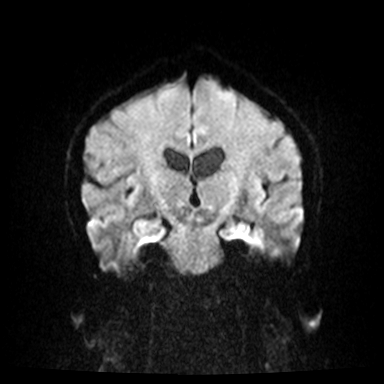
[im 39/39]
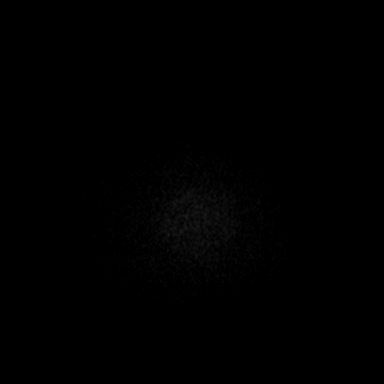

[Series 8: cor dwi_adc · coronal · 5.0mm · 0.60mm/px · 3 of 39 slices shown]
[im 1/39]
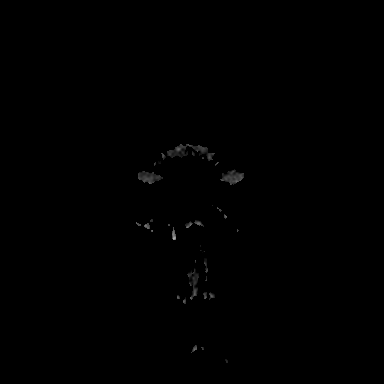
[im 20/39]
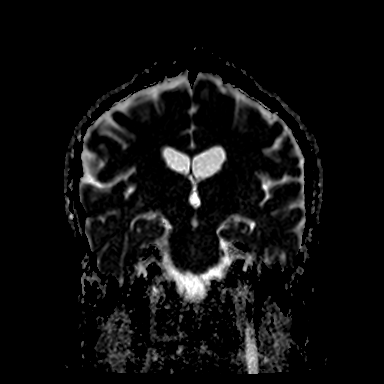
[im 39/39]
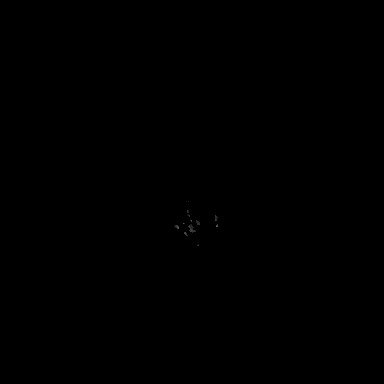

[Series 9: T1 · sagittal · 5.0mm · 0.62mm/px · 2 of 23 slices shown (1 of 2)]
[im 1/23]
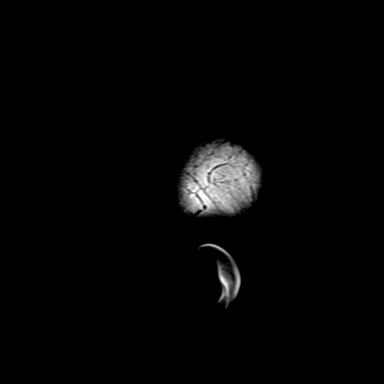
[im 23/23]
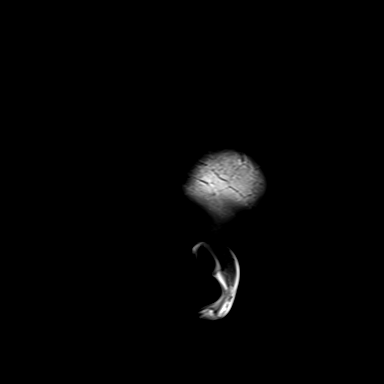

[Series 10: T2 · axial · 5.0mm · 0.53mm/px · z∈[-100,+61]mm · 2 of 28 slices shown (1 of 2)]
[im 1/28]
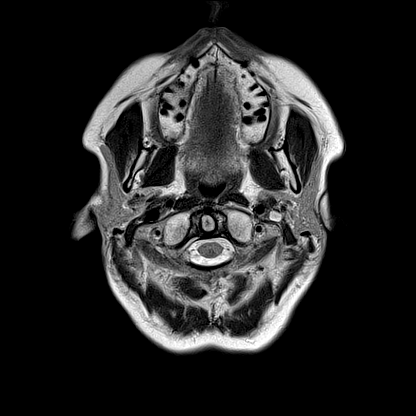
[im 28/28]
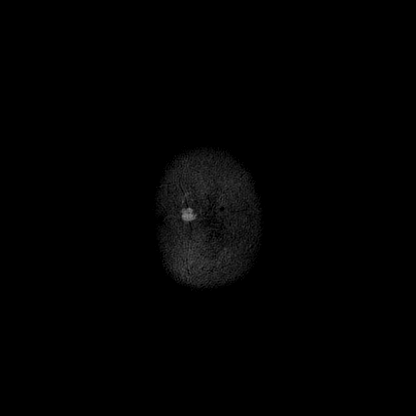

[Series 11: mag_images · axial · 3.0mm · 0.90mm/px · z∈[-107,+69]mm · 4 of 60 slices shown]
[im 1/60]
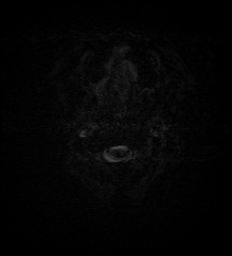
[im 20/60]
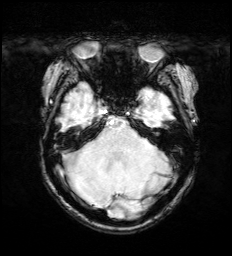
[im 40/60]
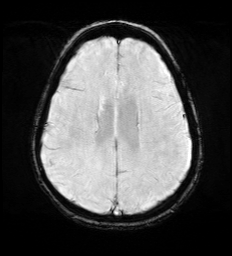
[im 60/60]
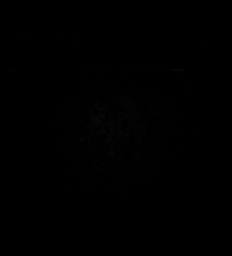

[Series 12: pha_images · axial · 3.0mm · 0.90mm/px · z∈[-107,+69]mm · 4 of 59 slices shown]
[im 1/59]
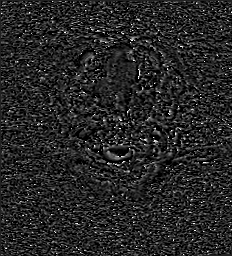
[im 20/59]
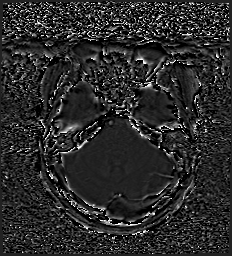
[im 39/59]
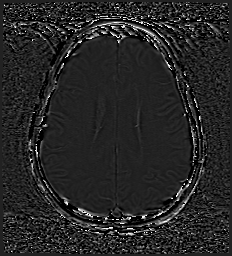
[im 59/59]
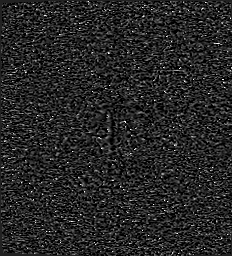

[Series 13: swi_images · axial · 3.0mm · 0.90mm/px · z∈[-107,+9]mm · 3 of 60 slices shown]
[im 1/60]
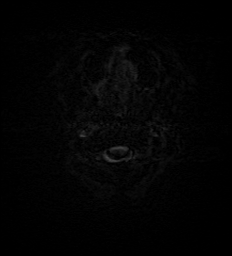
[im 20/60]
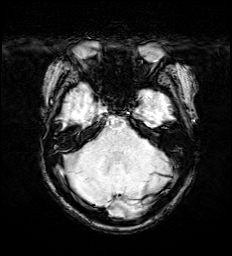
[im 40/60]
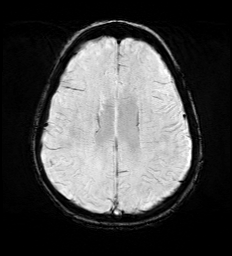

[Series 15: FLAIR · axial · 3.0mm · 0.53mm/px · z∈[-100,+61]mm · 4 of 55 slices shown]
[im 1/55]
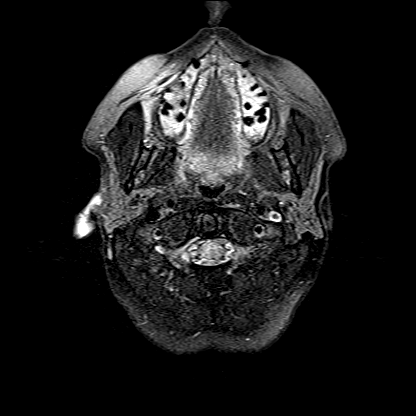
[im 19/55]
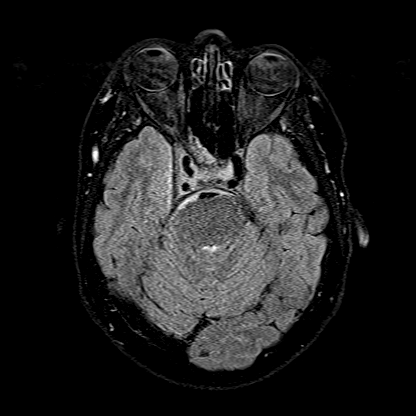
[im 37/55]
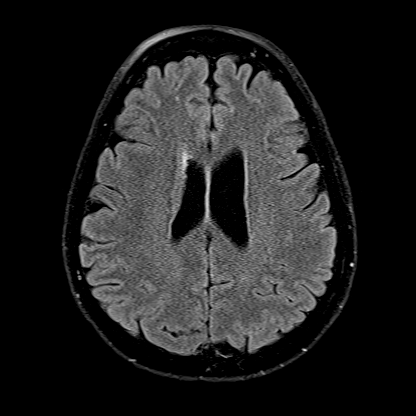
[im 55/55]
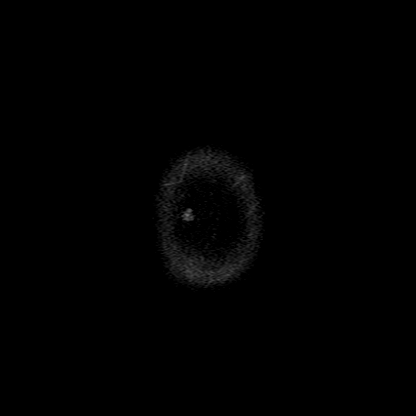

[Series 16: T1 · axial · 1.0mm · 0.98mm/px · z∈[-100,+75]mm · 8 of 176 slices shown (2 of 2)]
[im 1/176]
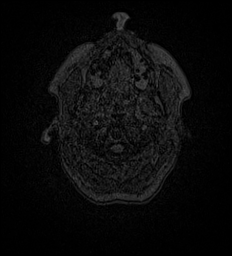
[im 32/176]
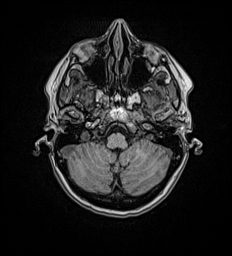
[im 48/176]
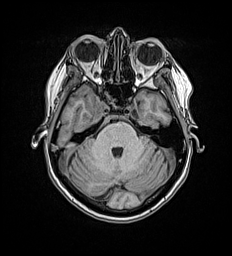
[im 80/176]
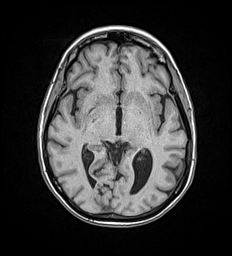
[im 96/176]
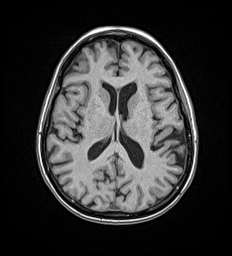
[im 128/176]
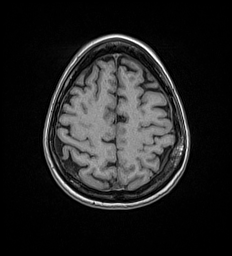
[im 144/176]
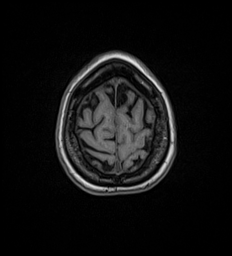
[im 176/176]
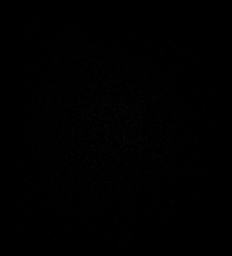

[Series 17: T2 · coronal · 5.0mm · 0.57mm/px · 2 of 29 slices shown (2 of 2)]
[im 1/29]
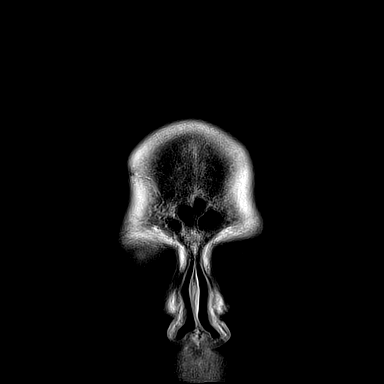
[im 29/29]
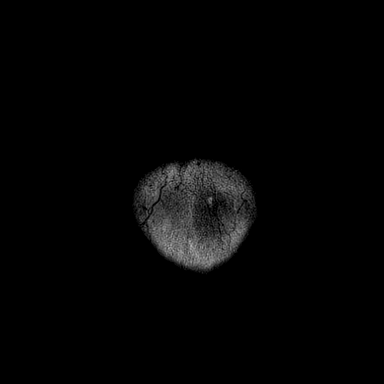

[43 of 48 positions shown; findings below may reference images not displayed]

FINDINGS: Brain: Negative for acute infarct. Several tiny white matter
hyperintensities may be due to chronic ischemia. Negative for
hemorrhage

Mass lesion in the sella and right cavernous sinus. The mass appears
to surround the right cavernous carotid which remains patent. The
mass is difficult to separate from the pituitary but appears to
infiltrate the sella. Mass extends along the clivus. The sella is
not enlarged. Mild up lifting of the optic chiasm and optic nerve on
the right with mild compression.

Ventricle size normal.

Vascular: Normal arterial flow voids

Skull and upper cervical spine: Negative

Sinuses/Orbits: Mild mucosal edema paranasal sinuses.  Normal orbit

Other: None
IMPRESSION: Infiltrating extra-axial mass involving the sella, right cavernous
sinus, and clivus. Favor meningioma. Differential diagnosis includes
metastatic disease and lymphoma. Pituitary adenoma not considered
likely. Mild mass-effect on the right cavernous sinus.

Recommend follow-up MRI with contrast which will be needed to
accurately delineate tumor margins.

Negative for acute infarct.

These results were called by telephone at the time of interpretation
on 10/24/2018 at [DATE] to Dr. ADIZER ATHALIA , who verbally
acknowledged these results.

## 2020-10-25 ENCOUNTER — Ambulatory Visit
Admission: RE | Admit: 2020-10-25 | Discharge: 2020-10-25 | Disposition: A | Discharge status: Home / Self Care | Payer: Medicare HMO | Source: Ambulatory Visit | Attending: Internal Medicine | Admitting: Internal Medicine

## 2020-10-25 ENCOUNTER — Other Ambulatory Visit: Payer: Self-pay

## 2020-10-25 DIAGNOSIS — Z1231 Encounter for screening mammogram for malignant neoplasm of breast: Secondary | ICD-10-CM

## 2020-12-16 ENCOUNTER — Encounter: Payer: Self-pay | Admitting: Internal Medicine

## 2020-12-23 DIAGNOSIS — H40053 Ocular hypertension, bilateral: Secondary | ICD-10-CM | POA: Diagnosis not present

## 2020-12-27 ENCOUNTER — Other Ambulatory Visit: Payer: Self-pay

## 2020-12-27 ENCOUNTER — Ambulatory Visit: Payer: Medicare HMO | Admitting: Dermatology

## 2020-12-27 DIAGNOSIS — C44319 Basal cell carcinoma of skin of other parts of face: Secondary | ICD-10-CM

## 2020-12-27 NOTE — Patient Instructions (Signed)
Wound Care Instructions  Cleanse wound gently with soap and water once a day then pat dry with clean gauze. Apply a thing coat of Petrolatum (petroleum jelly, "Vaseline") over the wound (unless you have an allergy to this). We recommend that you use a new, sterile tube of Vaseline. Do not pick or remove scabs. Do not remove the yellow or white "healing tissue" from the base of the wound.  Cover the wound with fresh, clean, nonstick gauze and secure with paper tape. You may use Band-Aids in place of gauze and tape if the would is small enough, but would recommend trimming much of the tape off as there is often too much. Sometimes Band-Aids can irritate the skin.  You should call the office for your biopsy report after 1 week if you have not already been contacted.  If you experience any problems, such as abnormal amounts of bleeding, swelling, significant bruising, significant pain, or evidence of infection, please call the office immediately.  FOR ADULT SURGERY PATIENTS: If you need something for pain relief you may take 1 extra strength Tylenol (acetaminophen) AND 2 Ibuprofen (200mg each) together every 4 hours as needed for pain. (do not take these if you are allergic to them or if you have a reason you should not take them.) Typically, you may only need pain medication for 1 to 3 days.   If you have any questions or concerns for your doctor, please call our main line at [phone] and press option 4 to reach your doctor's medical assistant. If no one answers, please leave a voicemail as directed and we will return your call as soon as possible. Messages left after 4 pm will be answered the following business day.   You may also send us a message via MyChart. We typically respond to MyChart messages within 1-2 business days.  For prescription refills, please ask your pharmacy to contact our office. Our fax number is [phone].  If you have an urgent issue when the clinic is closed that  cannot wait until the next business day, you can page your doctor at the number below.    Please note that while we do our best to be available for urgent issues outside of office hours, we are not available 24/7.   If you have an urgent issue and are unable to reach us, you may choose to seek medical care at your doctor's office, retail clinic, urgent care center, or emergency room.  If you have a medical emergency, please immediately call 911 or go to the emergency department.  Pager Numbers  - Dr. Kowalski: [phone]  - Dr. Moye: [phone]  - Dr. Stewart: [phone]  In the event of inclement weather, please call our main line at [phone] for an update on the status of any delays or closures.  Dermatology Medication Tips: Please keep the boxes that topical medications come in in order to help keep track of the instructions about where and how to use these. Pharmacies typically print the medication instructions only on the boxes and not directly on the medication tubes.   If your medication is too expensive, please contact our office at [phone] option 4 or send us a message through MyChart.   We are unable to tell what your co-pay for medications will be in advance as this is different depending on your insurance coverage. However, we may be able to find a substitute medication at lower cost or fill out paperwork to get insurance to cover a needed   medication.   If a prior authorization is required to get your medication covered by your insurance company, please allow us 1-2 business days to complete this process.  Drug prices often vary depending on where the prescription is filled and some pharmacies may offer cheaper prices.  The website www.goodrx.com contains coupons for medications through different pharmacies. The prices here do not account for what the cost may be with help from insurance (it may be cheaper with your insurance), but the website can give you the  price if you did not use any insurance.  - You can print the associated coupon and take it with your prescription to the pharmacy.  - You may also stop by our office during regular business hours and pick up a GoodRx coupon card.  - If you need your prescription sent electronically to a different pharmacy, notify our office through [facility] MyChart or by phone at [phone] option 4.   

## 2020-12-27 NOTE — Progress Notes (Signed)
   Follow-Up Visit   Subjective  Sarah Perry is a 70 y.o. female who presents for the following: Basal Cell Carcinoma (Left preauricular. Patient presents for excision.).   The following portions of the chart were reviewed this encounter and updated as appropriate:       Review of Systems:  No other skin or systemic complaints except as noted in HPI or Assessment and Plan.  Objective  Well appearing patient in no apparent distress; mood and affect are within normal limits.  A focused examination was performed including face. Relevant physical exam findings are noted in the Assessment and Plan.  Left Preauricular Pink biopsy site.   Assessment & Plan  Basal cell carcinoma (BCC) of skin of other part of face Left Preauricular  Skin excision  Lesion length (cm):  0.5 Lesion width (cm):  0.5 Margin per side (cm):  0.2 Total excision diameter (cm):  0.9 Informed consent: discussed and consent obtained   Timeout: patient name, date of birth, surgical site, and procedure verified   Procedure prep:  Patient was prepped and draped in usual sterile fashion Prep type:  Povidone-iodine Anesthesia: the lesion was anesthetized in a standard fashion   Anesthesia comment:  Total 6.0cc- 3.0 cc lido w/epi, 3.0 cc bupivicaine Anesthetic:  1% lidocaine w/ epinephrine 1-100,000 buffered w/ 8.4% NaHCO3 (0.5% bupivicaine) Instrument used comment:  #15c blade Hemostasis achieved with: pressure   Outcome: patient tolerated procedure well with no complications    Skin repair Complexity:  Complex Final length (cm):  2.7 Informed consent: discussed and consent obtained   Timeout: patient name, date of birth, surgical site, and procedure verified   Reason for type of repair: reduce tension to allow closure, reduce the risk of dehiscence, infection, and necrosis, reduce subcutaneous dead space and avoid a hematoma, preserve normal anatomical and functional relationships and enhance both  functionality and cosmetic results   Undermining: area extensively undermined   Undermining comment:  0.9 cm Subcutaneous layers (deep stitches):  Suture size:  5-0 Suture type: Vicryl (polyglactin 910)   Stitches:  Buried vertical mattress Fine/surface layer approximation (top stitches):  Suture size:  5-0 Suture type: nylon   Suture type comment:  Nylon Stitches: simple interrupted   Suture removal (days):  7 Hemostasis achieved with: suture and pressure Outcome: patient tolerated procedure well with no complications   Post-procedure details: sterile dressing applied and wound care instructions given   Dressing type: pressure dressing (mupirocin)    Specimen 1 - Surgical pathology Differential Diagnosis: BCC, biopsy proven Check Margins: Yes Pink biopsy site, 0.5cm [id number] Tagged at 12 o'clock superior tip  BASAL CELL CARCINOMA, NODULAR PATTERN, biopsy proven  Return in about 1 week (around 01/03/2021) for suture removal.  I, Jamesetta Orleans, CMA, am acting as scribe for Brendolyn Patty, MD . Documentation: I have reviewed the above documentation for accuracy and completeness, and I agree with the above.  Brendolyn Patty MD

## 2020-12-28 ENCOUNTER — Telehealth: Payer: Self-pay

## 2020-12-28 DIAGNOSIS — E038 Other specified hypothyroidism: Secondary | ICD-10-CM | POA: Diagnosis not present

## 2020-12-28 DIAGNOSIS — K219 Gastro-esophageal reflux disease without esophagitis: Secondary | ICD-10-CM | POA: Diagnosis not present

## 2020-12-28 DIAGNOSIS — D352 Benign neoplasm of pituitary gland: Secondary | ICD-10-CM | POA: Diagnosis not present

## 2020-12-28 DIAGNOSIS — H409 Unspecified glaucoma: Secondary | ICD-10-CM | POA: Diagnosis not present

## 2020-12-28 DIAGNOSIS — Z8249 Family history of ischemic heart disease and other diseases of the circulatory system: Secondary | ICD-10-CM | POA: Diagnosis not present

## 2020-12-28 DIAGNOSIS — D329 Benign neoplasm of meninges, unspecified: Secondary | ICD-10-CM | POA: Diagnosis not present

## 2020-12-28 DIAGNOSIS — Z809 Family history of malignant neoplasm, unspecified: Secondary | ICD-10-CM | POA: Diagnosis not present

## 2020-12-28 DIAGNOSIS — N951 Menopausal and female climacteric states: Secondary | ICD-10-CM | POA: Diagnosis not present

## 2020-12-28 DIAGNOSIS — Z880 Allergy status to penicillin: Secondary | ICD-10-CM | POA: Diagnosis not present

## 2020-12-28 NOTE — Telephone Encounter (Signed)
Talked to patient and she is doing fine from surgery yesterday.  

## 2021-01-03 ENCOUNTER — Ambulatory Visit (INDEPENDENT_AMBULATORY_CARE_PROVIDER_SITE_OTHER): Payer: Medicare HMO | Admitting: Dermatology

## 2021-01-03 ENCOUNTER — Encounter: Payer: Self-pay | Admitting: Dermatology

## 2021-01-03 ENCOUNTER — Other Ambulatory Visit: Payer: Self-pay

## 2021-01-03 DIAGNOSIS — C44319 Basal cell carcinoma of skin of other parts of face: Secondary | ICD-10-CM

## 2021-01-03 NOTE — Progress Notes (Signed)
   Follow-Up Visit   Subjective  Sarah Perry is a 70 y.o. female who presents for the following: BCC margins free, bx proven (L preauricular, 1 wk post op, pt presents for suture removal).   The following portions of the chart were reviewed this encounter and updated as appropriate:       Review of Systems:  No other skin or systemic complaints except as noted in HPI or Assessment and Plan.  Objective  Well appearing patient in no apparent distress; mood and affect are within normal limits.  A focused examination was performed including face. Relevant physical exam findings are noted in the Assessment and Plan.  L preauricular Healing exc site  upper forehead, R malar cheek Pink bx sites   Assessment & Plan  Basal cell carcinoma (BCC) of skin of other part of face (2) L preauricular  upper forehead, R malar cheek  L preauricular Margins free, bx proven  Encounter for Removal of Sutures - Incision site at the left preauricular is clean, dry and intact - Wound cleansed, sutures removed, wound cleansed and steri strips applied.  - Discussed pathology results showing BCC margins free  - Patient advised to keep steri-strips dry until they fall off. - Scars remodel for a full year. - Once steri-strips fall off, patient can apply over-the-counter silicone scar cream each night to help with scar remodeling if desired. - Patient advised to call with any concerns or if they notice any new or changing lesions.   Upper forehead, R malar cheek, pt is scheduled for [facility] with Dr. Lacinda Axon  Return for as scheduled.  I, Othelia Pulling, RMA, am acting as scribe for Brendolyn Patty, MD .  Documentation: I have reviewed the above documentation for accuracy and completeness, and I agree with the above.  Brendolyn Patty MD

## 2021-01-03 NOTE — Patient Instructions (Signed)

## 2021-01-04 ENCOUNTER — Encounter: Payer: Self-pay | Admitting: Internal Medicine

## 2021-01-05 DIAGNOSIS — E039 Hypothyroidism, unspecified: Secondary | ICD-10-CM | POA: Diagnosis not present

## 2021-01-05 DIAGNOSIS — D352 Benign neoplasm of pituitary gland: Secondary | ICD-10-CM | POA: Diagnosis not present

## 2021-01-05 DIAGNOSIS — E221 Hyperprolactinemia: Secondary | ICD-10-CM | POA: Diagnosis not present

## 2021-01-06 ENCOUNTER — Other Ambulatory Visit: Payer: Self-pay | Admitting: Internal Medicine

## 2021-02-07 DIAGNOSIS — C44319 Basal cell carcinoma of skin of other parts of face: Secondary | ICD-10-CM | POA: Diagnosis not present

## 2021-03-07 ENCOUNTER — Encounter: Payer: Self-pay | Admitting: Internal Medicine

## 2021-03-07 NOTE — Telephone Encounter (Signed)
Patient switched to a virtual to discuss symptoms. Patient confrimed no sob. Chest tightness, etc. If symptoms worsen or change, patient will be evaluated sooner.

## 2021-03-09 ENCOUNTER — Telehealth: Payer: Self-pay | Admitting: Internal Medicine

## 2021-03-09 ENCOUNTER — Encounter: Payer: Self-pay | Admitting: Internal Medicine

## 2021-03-09 ENCOUNTER — Telehealth (INDEPENDENT_AMBULATORY_CARE_PROVIDER_SITE_OTHER): Payer: Medicare HMO | Admitting: Internal Medicine

## 2021-03-09 DIAGNOSIS — E039 Hypothyroidism, unspecified: Secondary | ICD-10-CM

## 2021-03-09 DIAGNOSIS — R051 Acute cough: Secondary | ICD-10-CM | POA: Diagnosis not present

## 2021-03-09 MED ORDER — BENZONATATE 100 MG PO CAPS: 100.0000 mg | ORAL_CAPSULE | Freq: Three times a day (TID) | ORAL | 0 refills | Status: DC | PRN

## 2021-03-09 NOTE — Addendum Note (Signed)
Addended by: Lars Masson on: 03/09/2021 08:18 AM   Modules accepted: Orders

## 2021-03-09 NOTE — Telephone Encounter (Signed)
Schedule physical in 6 - 8 weeks.

## 2021-03-09 NOTE — Progress Notes (Signed)
Patient ID: Sarah Perry, female   DOB: 18-Jun-1950, 70 y.o.   MRN: [id number]   Virtual Visit via video Note  This visit type was conducted due to national recommendations for restrictions regarding the COVID-19 pandemic (e.g. social distancing).  This format is felt to be most appropriate for this patient at this time.  All issues noted in this document were discussed and addressed.  No physical exam was performed (except for noted visual exam findings with Video Visits).   I connected with [name] by a video enabled telemedicine application and verified that I am speaking with the correct person using two identifiers. Location patient: home Location provider: work  Persons participating in the virtual visit: patient, provider  The limitations, risks, security and privacy concerns of performing an evaluation and management service by video and the availability of in person appointments have been discussed.  It has also been discussed with the patient that there may be a patient responsible charge related to this service. The patient expressed understanding and agreed to proceed.   Reason for visit: work in appt   HPI: Work in for concerns regarding cough and congestion.  Symptoms started Sunday night (03/06/21) - aching, cough, stuffy/runny nose.  Developed fever - 101.4 - Tmax.  Reports increased head congestion and nasal congestoin.  Sore throat.  Did not sleep well last night.  Increased cough, but no coughing fits.  No chest congestion.  No chest pain or sob.  No nausea, vomiting or diarrhea.     ROS: See pertinent positives and negatives per HPI.  Past Medical History:  Diagnosis Date   Basal cell carcinoma 12/08/2014   L spinal mid back    Basal cell carcinoma 12/08/2014   R spinal upper back    Basal cell carcinoma 12/08/2014   L upper sternum    Basal cell carcinoma 02/11/2018   L med pretibial    Basal cell carcinoma 02/11/2018   L lower pretibial    Basal cell  carcinoma 09/17/2018   R shoulder    Basal cell carcinoma 09/17/2018   L lat pretibial    Basal cell carcinoma 02/18/2019   R parietal scalp    Basal cell carcinoma 02/28/2019   Central nasal tip    Basal cell carcinoma 09/23/2019   R mid chin    Basal cell carcinoma 09/23/2019   R clavicle    Basal cell carcinoma 10/14/2020   left superior medial forehead, Moh's 02/07/21   Basal cell carcinoma 10/14/2020   right medial cheek, Moh's 02/07/21   BCC (basal cell carcinoma of skin) 10/14/2020   L preauricular, exc 12/27/20   Degenerative arthritis    hips, knees   GERD (gastroesophageal reflux disease)    Varicella zoster 01/2008    Past Surgical History:  Procedure Laterality Date   BASAL CELL CARCINOMA EXCISION     DILATION AND CURETTAGE OF UTERUS     TONSILECTOMY/ADENOIDECTOMY WITH MYRINGOTOMY      Family History  Problem Relation Age of Onset   Ovarian cancer Mother    Hypercholesterolemia Mother    Dementia Father    Prostate cancer Father    Breast cancer Paternal Aunt    Pancreatic cancer Maternal Aunt    Melanoma Sister     SOCIAL HX: reviewed.    Current Outpatient Medications:    benzonatate (TESSALON PERLES) 100 MG capsule, Take 1 capsule (100 mg total) by mouth 3 (three) times daily as needed for cough., Disp: 21 capsule, Rfl:  0   levothyroxine (SYNTHROID) 50 MCG tablet, Take 50 mcg by mouth daily., Disp: , Rfl:    aspirin 81 MG EC tablet, Take 1 tablet by mouth daily., Disp: , Rfl:    calcium-vitamin D (OSCAL-500) 500-400 MG-UNIT tablet, Take 1 tablet by mouth daily. , Disp: , Rfl:    LUMIGAN 0.01 % SOLN, SMARTSIG:In Eye(s), Disp: , Rfl:    Magnesium Oxide 400 (240 Mg) MG TABS, , Disp: , Rfl:    Multiple Vitamin (MULTIVITAMIN) tablet, Take 1 tablet by mouth daily., Disp: , Rfl:    omeprazole (PRILOSEC) 20 MG capsule, TAKE ONE CAPSULE BY MOUTH DAILY, Disp: 90 capsule, Rfl: 2   venlafaxine XR (EFFEXOR-XR) 75 MG 24 hr capsule, TAKE ONE CAPSULE BY MOUTH  DAILY, Disp: 90 capsule, Rfl: 1  EXAM:  GENERAL: alert, oriented, appears well and in no acute distress  HEENT: atraumatic, conjunttiva clear, no obvious abnormalities on inspection of external nose and ears  NECK: normal movements of the head and neck  LUNGS: on inspection no signs of respiratory distress, breathing rate appears normal, no obvious gross SOB, gasping or wheezing. No increased cough with forced expiration.   CV: no obvious cyanosis  PSYCH/NEURO: pleasant and cooperative, no obvious depression or anxiety, speech and thought processing grossly intact  ASSESSMENT AND PLAN:  Discussed the following assessment and plan:  Problem List Items Addressed This Visit     Cough    Head congestion, nasal congestion, fever, sore throat and cough.  Discussed possible etiologies, including Flu, covid, RSV, etc.  Will come to office for swab.  Treat with nasacort nasal spray and saline nasal spray as directed.  Robitussin DM as directed.  rx sent in for tessalon perles.  Hold on prednisone.  Follow.  Call with update.        Relevant Orders   COVID-19, Flu A+B and RSV   Hypothyroid    Started on thyroid medication.  Questions answered.       Relevant Medications   levothyroxine (SYNTHROID) 50 MCG tablet    Return in about 7 weeks (around 04/27/2021) for physical - 6-8 weeks.  .   I discussed the assessment and treatment plan with the patient. The patient was provided an opportunity to ask questions and all were answered. The patient agreed with the plan and demonstrated an understanding of the instructions.   The patient was advised to call back or seek an in-person evaluation if the symptoms worsen or if the condition fails to improve as anticipated.    D[place], MD

## 2021-03-09 NOTE — Assessment & Plan Note (Signed)
Started on thyroid medication.  Questions answered.

## 2021-03-09 NOTE — Assessment & Plan Note (Signed)
Head congestion, nasal congestion, fever, sore throat and cough.  Discussed possible etiologies, including Flu, covid, RSV, etc.  Will come to office for swab.  Treat with nasacort nasal spray and saline nasal spray as directed.  Robitussin DM as directed.  rx sent in for tessalon perles.  Hold on prednisone.  Follow.  Call with update.

## 2021-03-10 LAB — COVID-19, FLU A+B AND RSV
Influenza A, NAA: NOT DETECTED
Influenza B, NAA: NOT DETECTED
RSV, NAA: NOT DETECTED
SARS-CoV-2, NAA: NOT DETECTED

## 2021-03-15 ENCOUNTER — Ambulatory Visit (INDEPENDENT_AMBULATORY_CARE_PROVIDER_SITE_OTHER): Payer: Medicare HMO

## 2021-03-15 VITALS — Ht 64.0 in | Wt 169.0 lb

## 2021-03-15 DIAGNOSIS — Z0000 Encounter for general adult medical examination without abnormal findings: Secondary | ICD-10-CM

## 2021-03-15 NOTE — Patient Instructions (Addendum)
Sarah Perry , Thank you for taking time to come for your Medicare Wellness Visit. I appreciate your ongoing commitment to your health goals. Please review the following plan we discussed and let me know if I can assist you in the future.   These are the goals we discussed:  Goals      Maintain Healthy Lifestyle     Stay active Healthy diet        This is a list of the screening recommended for you and due dates:  Health Maintenance  Topic Date Due   Mammogram  10/26/2022   Cologuard (Stool DNA test)  01/27/2023   Tetanus Vaccine  03/11/2030   Pneumonia Vaccine  Completed   Flu Shot  Completed   DEXA scan (bone density measurement)  Completed   COVID-19 Vaccine  Completed   Hepatitis C Screening: USPSTF Recommendation to screen - Ages 59-79 yo.  Completed   Zoster (Shingles) Vaccine  Completed   HPV Vaccine  Aged Out   Colon Cancer Screening  Discontinued    Advanced directives: not yet completed  Conditions/risks identified: none new  Follow up in one year for your annual wellness visit    Preventive Care 65 Years and Older, Female Preventive care refers to lifestyle choices and visits with your health care provider that can promote health and wellness. What does preventive care include? A yearly physical exam. This is also called an annual well check. Dental exams once or twice a year. Routine eye exams. Ask your health care provider how often you should have your eyes checked. Personal lifestyle choices, including: Daily care of your teeth and gums. Regular physical activity. Eating a healthy diet. Avoiding tobacco and drug use. Limiting alcohol use. Practicing safe sex. Taking low-dose aspirin every day. Taking vitamin and mineral supplements as recommended by your health care provider. What happens during an annual well check? The services and screenings done by your health care provider during your annual well check will depend on your age, overall health,  lifestyle risk factors, and family history of disease. Counseling  Your health care provider may ask you questions about your: Alcohol use. Tobacco use. Drug use. Emotional well-being. Home and relationship well-being. Sexual activity. Eating habits. History of falls. Memory and ability to understand (cognition). Work and work Statistician. Reproductive health. Screening  You may have the following tests or measurements: Height, weight, and BMI. Blood pressure. Lipid and cholesterol levels. These may be checked every 5 years, or more frequently if you are over 27 years old. Skin check. Lung cancer screening. You may have this screening every year starting at age 38 if you have a 30-pack-year history of smoking and currently smoke or have quit within the past 15 years. Fecal occult blood test (FOBT) of the stool. You may have this test every year starting at age 72. Flexible sigmoidoscopy or colonoscopy. You may have a sigmoidoscopy every 5 years or a colonoscopy every 10 years starting at age 59. Hepatitis C blood test. Hepatitis B blood test. Sexually transmitted disease (STD) testing. Diabetes screening. This is done by checking your blood sugar (glucose) after you have not eaten for a while (fasting). You may have this done every 1-3 years. Bone density scan. This is done to screen for osteoporosis. You may have this done starting at age 40. Mammogram. This may be done every 1-2 years. Talk to your health care provider about how often you should have regular mammograms. Talk with your health care provider about  your test results, treatment options, and if necessary, the need for more tests. Vaccines  Your health care provider may recommend certain vaccines, such as: Influenza vaccine. This is recommended every year. Tetanus, diphtheria, and acellular pertussis (Tdap, Td) vaccine. You may need a Td booster every 10 years. Zoster vaccine. You may need this after age  31. Pneumococcal 13-valent conjugate (PCV13) vaccine. One dose is recommended after age 45. Pneumococcal polysaccharide (PPSV23) vaccine. One dose is recommended after age 35. Talk to your health care provider about which screenings and vaccines you need and how often you need them. This information is not intended to replace advice given to you by your health care provider. Make sure you discuss any questions you have with your health care provider. Document Released: 04/23/2015 Document Revised: 12/15/2015 Document Reviewed: 01/26/2015 Elsevier Interactive Patient Education  2017 [place] Prevention in the Home Falls can cause injuries. They can happen to people of all ages. There are many things you can do to make your home safe and to help prevent falls. What can I do on the outside of my home? Regularly fix the edges of walkways and driveways and fix any cracks. Remove anything that might make you trip as you walk through a door, such as a raised step or threshold. Trim any bushes or trees on the path to your home. Use bright outdoor lighting. Clear any walking paths of anything that might make someone trip, such as rocks or tools. Regularly check to see if handrails are loose or broken. Make sure that both sides of any steps have handrails. Any raised decks and porches should have guardrails on the edges. Have any leaves, snow, or ice cleared regularly. Use sand or salt on walking paths during winter. Clean up any spills in your garage right away. This includes oil or grease spills. What can I do in the bathroom? Use night lights. Install grab bars by the toilet and in the tub and shower. Do not use towel bars as grab bars. Use non-skid mats or decals in the tub or shower. If you need to sit down in the shower, use a plastic, non-slip stool. Keep the floor dry. Clean up any water that spills on the floor as soon as it happens. Remove soap buildup in the tub or shower  regularly. Attach bath mats securely with double-sided non-slip rug tape. Do not have throw rugs and other things on the floor that can make you trip. What can I do in the bedroom? Use night lights. Make sure that you have a light by your bed that is easy to reach. Do not use any sheets or blankets that are too big for your bed. They should not hang down onto the floor. Have a firm chair that has side arms. You can use this for support while you get dressed. Do not have throw rugs and other things on the floor that can make you trip. What can I do in the kitchen? Clean up any spills right away. Avoid walking on wet floors. Keep items that you use a lot in easy-to-reach places. If you need to reach something above you, use a strong step stool that has a grab bar. Keep electrical cords out of the way. Do not use floor polish or wax that makes floors slippery. If you must use wax, use non-skid floor wax. Do not have throw rugs and other things on the floor that can make you trip. What can I do with  my stairs? Do not leave any items on the stairs. Make sure that there are handrails on both sides of the stairs and use them. Fix handrails that are broken or loose. Make sure that handrails are as long as the stairways. Check any carpeting to make sure that it is firmly attached to the stairs. Fix any carpet that is loose or worn. Avoid having throw rugs at the top or bottom of the stairs. If you do have throw rugs, attach them to the floor with carpet tape. Make sure that you have a light switch at the top of the stairs and the bottom of the stairs. If you do not have them, ask someone to add them for you. What else can I do to help prevent falls? Wear shoes that: Do not have high heels. Have rubber bottoms. Are comfortable and fit you well. Are closed at the toe. Do not wear sandals. If you use a stepladder: Make sure that it is fully opened. Do not climb a closed stepladder. Make sure that  both sides of the stepladder are locked into place. Ask someone to hold it for you, if possible. Clearly mark and make sure that you can see: Any grab bars or handrails. First and last steps. Where the edge of each step is. Use tools that help you move around (mobility aids) if they are needed. These include: Canes. Walkers. Scooters. Crutches. Turn on the lights when you go into a dark area. Replace any light bulbs as soon as they burn out. Set up your furniture so you have a clear path. Avoid moving your furniture around. If any of your floors are uneven, fix them. If there are any pets around you, be aware of where they are. Review your medicines with your doctor. Some medicines can make you feel dizzy. This can increase your chance of falling. Ask your doctor what other things that you can do to help prevent falls. This information is not intended to replace advice given to you by your health care provider. Make sure you discuss any questions you have with your health care provider. Document Released: 01/21/2009 Document Revised: 09/02/2015 Document Reviewed: 05/01/2014 Elsevier Interactive Patient Education  2017 [employer].

## 2021-03-15 NOTE — Progress Notes (Signed)
Subjective:   Sarah Perry is a 70 y.o. female who presents for Medicare Annual (Subsequent) preventive examination.  Review of Systems    No ROS.  Medicare Wellness Virtual Visit.  Visual/audio telehealth visit, UTA vital signs.   See social history for additional risk factors.   Cardiac Risk Factors include: advanced age (>24men, >56 women)     Objective:    Today's Vitals   03/15/21 0904  Weight: 169 lb (76.7 kg)  Height: 5\' 4"  (1.626 m)   Body mass index is 29.01 kg/m.  Advanced Directives 03/15/2021 03/12/2020 03/22/2019 03/11/2019 10/24/2018  Does Patient Have a Medical Advance Directive? No No No No No  Would patient like information on creating a medical advance directive? No - Patient declined No - Patient declined - No - Patient declined No - Patient declined    Current Medications (verified) Outpatient Encounter Medications as of 03/15/2021  Medication Sig   aspirin 81 MG EC tablet Take 1 tablet by mouth daily.   benzonatate (TESSALON PERLES) 100 MG capsule Take 1 capsule (100 mg total) by mouth 3 (three) times daily as needed for cough.   calcium-vitamin D (OSCAL-500) 500-400 MG-UNIT tablet Take 1 tablet by mouth daily.    levothyroxine (SYNTHROID) 50 MCG tablet Take 50 mcg by mouth daily.   LUMIGAN 0.01 % SOLN SMARTSIG:In Eye(s)   Magnesium Oxide 400 (240 Mg) MG TABS    Multiple Vitamin (MULTIVITAMIN) tablet Take 1 tablet by mouth daily.   omeprazole (PRILOSEC) 20 MG capsule TAKE ONE CAPSULE BY MOUTH DAILY   venlafaxine XR (EFFEXOR-XR) 75 MG 24 hr capsule TAKE ONE CAPSULE BY MOUTH DAILY   No facility-administered encounter medications on file as of 03/15/2021.    Allergies (verified) Penicillins   History: Past Medical History:  Diagnosis Date   Basal cell carcinoma 12/08/2014   L spinal mid back    Basal cell carcinoma 12/08/2014   R spinal upper back    Basal cell carcinoma 12/08/2014   L upper sternum    Basal cell carcinoma 02/11/2018   L med  pretibial    Basal cell carcinoma 02/11/2018   L lower pretibial    Basal cell carcinoma 09/17/2018   R shoulder    Basal cell carcinoma 09/17/2018   L lat pretibial    Basal cell carcinoma 02/18/2019   R parietal scalp    Basal cell carcinoma 02/28/2019   Central nasal tip    Basal cell carcinoma 09/23/2019   R mid chin    Basal cell carcinoma 09/23/2019   R clavicle    Basal cell carcinoma 10/14/2020   left superior medial forehead, Moh's 02/07/21   Basal cell carcinoma 10/14/2020   right medial cheek, Moh's 02/07/21   BCC (basal cell carcinoma of skin) 10/14/2020   L preauricular, exc 12/27/20   Degenerative arthritis    hips, knees   GERD (gastroesophageal reflux disease)    Varicella zoster 01/2008   Past Surgical History:  Procedure Laterality Date   BASAL CELL CARCINOMA EXCISION     DILATION AND CURETTAGE OF UTERUS     TONSILECTOMY/ADENOIDECTOMY WITH MYRINGOTOMY     Family History  Problem Relation Age of Onset   Ovarian cancer Mother    Hypercholesterolemia Mother    Dementia Father    Prostate cancer Father    Breast cancer Paternal Aunt    Pancreatic cancer Maternal Aunt    Melanoma Sister    Social History   Socioeconomic History   Marital  status: Married    Spouse name: Not on file   Number of children: 2   Years of education: Not on file   Highest education level: Not on file  Occupational History   Not on file  Tobacco Use   Smoking status: Never   Smokeless tobacco: Never  Vaping Use   Vaping Use: Never used  Substance and Sexual Activity   Alcohol use: Yes    Alcohol/week: 0.0 standard drinks    Comment: glass of wine daily   Drug use: No   Sexual activity: Not on file  Other Topics Concern   Not on file  Social History Narrative   Not on file   Social Determinants of Health   Financial Resource Strain: Not on file  Food Insecurity: Not on file  Transportation Needs: Not on file  Physical Activity: Not on file  Stress: Not on  file  Social Connections: Not on file    Tobacco Counseling Counseling given: Not Answered   Clinical Intake:  Pre-visit preparation completed: Yes        Diabetes: No  How often do you need to have someone help you when you read instructions, pamphlets, or other written materials from your doctor or pharmacy?: 1 - Never    Interpreter Needed?: No      Activities of Daily Living In your present state of health, do you have any difficulty performing the following activities: 03/15/2021  Hearing? N  Vision? N  Difficulty concentrating or making decisions? N  Walking or climbing stairs? N  Dressing or bathing? N  Doing errands, shopping? N  Preparing Food and eating ? N  Using the Toilet? N  In the past six months, have you accidently leaked urine? N  Do you have problems with loss of bowel control? N  Managing your Medications? N  Managing your Finances? N  Housekeeping or managing your Housekeeping? N  Some recent data might be hidden    Patient Care Team: Einar Pheasant, MD as PCP - General (Internal Medicine)  Indicate any recent Medical Services you may have received from other than [facility] providers in the past year (date may be approximate).     Assessment:   This is a routine wellness examination for [name].  Virtual Visit via Telephone Note  I connected with  Raeghan Demeter Leckey on 03/15/21 at  9:00 AM EST by telephone and verified that I am speaking with the correct person using two identifiers.  Persons participating in the virtual visit: patient/Nurse Health Advisor   I discussed the limitations, risks, security and privacy concerns of performing an evaluation and management service by telephone and the availability of in person appointments. The patient expressed understanding and agreed to proceed.  Interactive audio and video telecommunications were attempted between this nurse and patient, however failed, due to patient having technical difficulties OR  patient did not have access to video capability.  We continued and completed visit with audio only.  Some vital signs may be absent or patient reported.   Hearing/Vision screen No results found.  Dietary issues and exercise activities discussed: Current Exercise Habits: Home exercise routine, Type of exercise: strength training/weights;walking (Aerobic; Silver sneaker), Time (Minutes): 60, Frequency (Times/Week): 5, Weekly Exercise (Minutes/Week): 300, Intensity: Mild   Goals Addressed             This Visit's Progress    Maintain Healthy Lifestyle       Stay active Healthy diet  Depression Screen PHQ 2/9 Scores 03/15/2021 03/12/2020 03/11/2019 09/12/2018 10/03/2016 03/30/2015  PHQ - 2 Score 0 0 0 0 0 0  PHQ- 9 Score - - - - 0 -    Fall Risk Fall Risk  03/15/2021 03/12/2020 03/08/2020 03/11/2019 09/12/2018  Falls in the past year? 0 0 0 0 0  Number falls in past yr: 0 0 - - -  Follow up Falls evaluation completed Falls evaluation completed Falls evaluation completed Falls prevention discussed;Education provided -    FALL RISK PREVENTION PERTAINING TO THE HOME: Home free of loose throw rugs in walkways, pet beds, electrical cords, etc? Yes  Adequate lighting in your home to reduce risk of falls? Yes   ASSISTIVE DEVICES UTILIZED TO PREVENT FALLS: Use of a cane, walker or w/c? No   TIMED UP AND GO: Was the test performed? No .   Cognitive Function:  Patient is alert and oriented x3. Enjoys reading and sodoku puzzles.  Manages her own finances and medications.   6CIT Screen 03/11/2019  What Year? 0 points  What month? 0 points  What time? 0 points  Count back from 20 0 points  Months in reverse 0 points  Repeat phrase 0 points  Total Score 0    Immunizations Immunization History  Administered Date(s) Administered   Influenza Split 01/06/2013, 12/30/2013, 01/05/2015   Influenza, High Dose Seasonal PF 11/28/2018   Influenza,inj,Quad PF,6+ Mos 12/12/2017    Influenza-Unspecified 01/01/2020, 12/16/2020   PFIZER(Purple Top)SARS-COV-2 Vaccination 05/18/2019, 06/10/2019, 01/13/2020, 07/09/2020   Pfizer Covid-19 Vaccine Bivalent Booster 2yrs & up 01/04/2021   Pneumococcal Conjugate-13 12/31/2017   Pneumococcal Polysaccharide-23 02/03/2019   Tdap 03/11/2020   Zoster Recombinat (Shingrix) 01/02/2018, 03/20/2018   Zoster, Live 03/12/2013   Screening Tests Health Maintenance  Topic Date Due   MAMMOGRAM  10/26/2022   Fecal DNA (Cologuard)  01/27/2023   TETANUS/TDAP  03/11/2030   Pneumonia Vaccine 74+ Years old  Completed   INFLUENZA VACCINE  Completed   DEXA SCAN  Completed   COVID-19 Vaccine  Completed   Hepatitis C Screening  Completed   Zoster Vaccines- Shingrix  Completed   HPV VACCINES  Aged Out   COLONOSCOPY (Pts 45-65yrs Insurance coverage will need to be confirmed)  Discontinued   Health Maintenance There are no preventive care reminders to display for this patient.  Lung Cancer Screening: (Low Dose CT Chest recommended if Age 26-80 years, 30 pack-year currently smoking OR have quit w/in 15years.) does not qualify.   Vision Screening: Recommended annual ophthalmology exams for early detection of glaucoma and other disorders of the eye.  Dental Screening: Recommended annual dental exams for proper oral hygiene  Community Resource Referral / Chronic Care Management: CRR required this visit?  No   CCM required this visit?  No      Plan:   Keep all routine maintenance appointments.   I have personally reviewed and noted the following in the patient's chart:   Medical and social history Use of alcohol, tobacco or illicit drugs  Current medications and supplements including opioid prescriptions. Not taking opioid.  Functional ability and status Nutritional status Physical activity Advanced directives List of other physicians Hospitalizations, surgeries, and ER visits in previous 12 months Vitals Screenings to include  cognitive, depression, and falls Referrals and appointments  In addition, I have reviewed and discussed with patient certain preventive protocols, quality metrics, and best practice recommendations. A written personalized care plan for preventive services as well as general preventive health recommendations were provided  to patient.     Varney Biles, LPN   17/11/3752

## 2021-03-21 ENCOUNTER — Other Ambulatory Visit: Payer: Self-pay

## 2021-03-21 ENCOUNTER — Encounter: Payer: Self-pay | Admitting: Internal Medicine

## 2021-03-21 MED ORDER — VENLAFAXINE HCL ER 75 MG PO CP24: 75.0000 mg | ORAL_CAPSULE | Freq: Every day | ORAL | 1 refills | Status: DC

## 2021-03-25 ENCOUNTER — Other Ambulatory Visit: Payer: Self-pay | Admitting: Neurosurgery

## 2021-03-25 DIAGNOSIS — D329 Benign neoplasm of meninges, unspecified: Secondary | ICD-10-CM

## 2021-04-26 ENCOUNTER — Ambulatory Visit: Payer: Medicare HMO | Admitting: Dermatology

## 2021-04-26 ENCOUNTER — Other Ambulatory Visit: Payer: Self-pay

## 2021-04-26 DIAGNOSIS — L910 Hypertrophic scar: Secondary | ICD-10-CM

## 2021-04-26 DIAGNOSIS — Z85828 Personal history of other malignant neoplasm of skin: Secondary | ICD-10-CM | POA: Diagnosis not present

## 2021-04-26 DIAGNOSIS — L570 Actinic keratosis: Secondary | ICD-10-CM

## 2021-04-26 DIAGNOSIS — L578 Other skin changes due to chronic exposure to nonionizing radiation: Secondary | ICD-10-CM | POA: Diagnosis not present

## 2021-04-26 NOTE — Progress Notes (Signed)
Follow-Up Visit   Subjective  Sarah Perry is a 71 y.o. female who presents for the following: Follow-up.  Patient presents for 6 month follow-up AKs. She has a history of BCCs (L preauricular, EXC 12/27/20) and (upper forehead, R malar cheek, both tx with [facility] 02/07/21). No new spots noticed.   The following portions of the chart were reviewed this encounter and updated as appropriate:       Review of Systems:  No other skin or systemic complaints except as noted in HPI or Assessment and Plan.  Objective  Well appearing patient in no apparent distress; mood and affect are within normal limits.  A focused examination was performed including face, arms. Relevant physical exam findings are noted in the Assessment and Plan.  Left preauricular, upper forehead, R malar cheek Well healed scar with no evidence of recurrence.   R malar cheek Firm linear scar  L nasal dorsum x 1, R neck x 1, R mid forearm x 1 (3) Pink scaly macules.  R med post shoulder x 1, L elbow x 1, R frontal hairline x 1 - clear    Assessment & Plan  Actinic Damage - chronic, secondary to cumulative UV radiation exposure/sun exposure over time - diffuse scaly erythematous macules with underlying dyspigmentation - Recommend daily broad spectrum sunscreen SPF 30+ to sun-exposed areas, reapply every 2 hours as needed.  - Recommend staying in the shade or wearing long sleeves, sun glasses (UVA+UVB protection) and wide brim hats (4-inch brim around the entire circumference of the hat). - Call for new or changing lesions.  History of Basal Cell Carcinoma of the Skin - No evidence of recurrence today. Multiple, see history. Nose clear - Recommend regular full body skin exams - Recommend daily broad spectrum sunscreen SPF 30+ to sun-exposed areas, reapply every 2 hours as needed.  - Call if any new or changing lesions are noted between office visits  History of basal cell carcinoma (BCC) Left preauricular,  upper forehead, R malar cheek  Clear. Observe for recurrence. Call clinic for new or changing lesions.  Recommend regular skin exams, daily broad-spectrum spf 30+ sunscreen use, and photoprotection.    Hypertrophic scar R malar cheek  At previous MOHs excision site (BCC).   Start Serica Scar Gel bid with massage- sample give.   Patient has follow-up with Dr Lacinda Axon next month and will have it re-evaluated at that time.   AK (actinic keratosis) (3) L nasal dorsum x 1, R neck x 1, R mid forearm x 1  Recheck right neck on f/u   Actinic keratoses are precancerous spots that appear secondary to cumulative UV radiation exposure/sun exposure over time. They are chronic with expected duration over 1 year. A portion of actinic keratoses will progress to squamous cell carcinoma of the skin. It is not possible to reliably predict which spots will progress to skin cancer and so treatment is recommended to prevent development of skin cancer.  Recommend daily broad spectrum sunscreen SPF 30+ to sun-exposed areas, reapply every 2 hours as needed.  Recommend staying in the shade or wearing long sleeves, sun glasses (UVA+UVB protection) and wide brim hats (4-inch brim around the entire circumference of the hat). Call for new or changing lesions. R med post shoulder, L elbow, R frontal hairline clear   Destruction of lesion - L nasal dorsum x 1, R neck x 1, R mid forearm x 1  Destruction method: cryotherapy   Informed consent: discussed and consent obtained  Lesion destroyed using liquid nitrogen: Yes   Region frozen until ice ball extended beyond lesion: Yes   Outcome: patient tolerated procedure well with no complications   Post-procedure details: wound care instructions given   Additional details:  Prior to procedure, discussed risks of blister formation, small wound, skin dyspigmentation, or rare scar following cryotherapy. Recommend Vaseline ointment to treated areas while healing.    Return  as scheduled for TBSE, Hx BCC, recheck R neck.  IJamesetta Orleans, CMA, am acting as scribe for Brendolyn Patty, MD . Documentation: I have reviewed the above documentation for accuracy and completeness, and I agree with the above.  Brendolyn Patty MD

## 2021-04-26 NOTE — Patient Instructions (Addendum)

## 2021-06-01 ENCOUNTER — Ambulatory Visit
Admission: RE | Admit: 2021-06-01 | Discharge: 2021-06-01 | Disposition: A | Discharge status: Home / Self Care | Payer: Medicare HMO | Source: Ambulatory Visit | Attending: Neurosurgery | Admitting: Neurosurgery

## 2021-06-01 ENCOUNTER — Other Ambulatory Visit: Payer: Self-pay

## 2021-06-01 DIAGNOSIS — D329 Benign neoplasm of meninges, unspecified: Secondary | ICD-10-CM | POA: Insufficient documentation

## 2021-06-01 DIAGNOSIS — H0520 Unspecified exophthalmos: Secondary | ICD-10-CM | POA: Diagnosis not present

## 2021-06-01 MED ORDER — GADOBUTROL 1 MMOL/ML IV SOLN: 7.0000 mL | Freq: Once | INTRAVENOUS | Status: DC | PRN

## 2021-06-01 MED ORDER — GADOBUTROL 1 MMOL/ML IV SOLN
7.5000 mL | Freq: Once | INTRAVENOUS | Status: AC | PRN
  Administered 2021-06-01: 7.5 mL via INTRAVENOUS

## 2021-06-07 DIAGNOSIS — D329 Benign neoplasm of meninges, unspecified: Secondary | ICD-10-CM | POA: Diagnosis not present

## 2021-06-14 ENCOUNTER — Ambulatory Visit (INDEPENDENT_AMBULATORY_CARE_PROVIDER_SITE_OTHER): Payer: Medicare HMO | Admitting: Internal Medicine

## 2021-06-14 ENCOUNTER — Other Ambulatory Visit: Payer: Self-pay

## 2021-06-14 ENCOUNTER — Encounter: Payer: Self-pay | Admitting: Internal Medicine

## 2021-06-14 VITALS — BP 122/74 | HR 68 | Temp 97.9°F | Resp 16 | Ht 71.0 in | Wt 164.0 lb

## 2021-06-14 DIAGNOSIS — R739 Hyperglycemia, unspecified: Secondary | ICD-10-CM

## 2021-06-14 DIAGNOSIS — E2839 Other primary ovarian failure: Secondary | ICD-10-CM

## 2021-06-14 DIAGNOSIS — E039 Hypothyroidism, unspecified: Secondary | ICD-10-CM

## 2021-06-14 DIAGNOSIS — D329 Benign neoplasm of meninges, unspecified: Secondary | ICD-10-CM | POA: Diagnosis not present

## 2021-06-14 DIAGNOSIS — K219 Gastro-esophageal reflux disease without esophagitis: Secondary | ICD-10-CM

## 2021-06-14 DIAGNOSIS — N951 Menopausal and female climacteric states: Secondary | ICD-10-CM

## 2021-06-14 DIAGNOSIS — Z1231 Encounter for screening mammogram for malignant neoplasm of breast: Secondary | ICD-10-CM

## 2021-06-14 DIAGNOSIS — Z13220 Encounter for screening for lipoid disorders: Secondary | ICD-10-CM | POA: Diagnosis not present

## 2021-06-14 DIAGNOSIS — Z0000 Encounter for general adult medical examination without abnormal findings: Secondary | ICD-10-CM | POA: Diagnosis not present

## 2021-06-14 NOTE — Progress Notes (Unsigned)
Patient ID: Sarah Perry, female   DOB: 1951/03/07, 71 y.o.   MRN: [id number]   Subjective:    Patient ID: Sarah Perry, female    DOB: 03-19-1951, 71 y.o.   MRN: [id number]  This visit occurred during the SARS-CoV-2 public health emergency.  Safety protocols were in place, including screening questions prior to the visit, additional usage of staff PPE, and extensive cleaning of exam room while observing appropriate contact time as indicated for disinfecting solutions.   Patient here for her physical exam.   Chief Complaint  Patient presents with   Annual Exam   .   HPI She is doing well.    Past Medical History:  Diagnosis Date   Basal cell carcinoma 12/08/2014   L spinal mid back    Basal cell carcinoma 12/08/2014   R spinal upper back    Basal cell carcinoma 12/08/2014   L upper sternum    Basal cell carcinoma 02/11/2018   L med pretibial    Basal cell carcinoma 02/11/2018   L lower pretibial    Basal cell carcinoma 09/17/2018   R shoulder    Basal cell carcinoma 09/17/2018   L lat pretibial    Basal cell carcinoma 02/18/2019   R parietal scalp    Basal cell carcinoma 02/28/2019   Central nasal tip    Basal cell carcinoma 09/23/2019   R mid chin    Basal cell carcinoma 09/23/2019   R clavicle    Basal cell carcinoma 10/14/2020   left superior medial forehead, Moh's 02/07/21   Basal cell carcinoma 10/14/2020   right medial cheek, Moh's 02/07/21   BCC (basal cell carcinoma of skin) 10/14/2020   L preauricular, exc 12/27/20   Degenerative arthritis    hips, knees   GERD (gastroesophageal reflux disease)    Varicella zoster 01/2008   Past Surgical History:  Procedure Laterality Date   BASAL CELL CARCINOMA EXCISION     DILATION AND CURETTAGE OF UTERUS     TONSILECTOMY/ADENOIDECTOMY WITH MYRINGOTOMY     Family History  Problem Relation Age of Onset   Ovarian cancer Mother    Hypercholesterolemia Mother    Dementia Father    Prostate cancer Father     Breast cancer Paternal Aunt    Pancreatic cancer Maternal Aunt    Melanoma Sister    Social History   Socioeconomic History   Marital status: Married    Spouse name: Not on file   Number of children: 2   Years of education: Not on file   Highest education level: Not on file  Occupational History   Not on file  Tobacco Use   Smoking status: Never   Smokeless tobacco: Never  Vaping Use   Vaping Use: Never used  Substance and Sexual Activity   Alcohol use: Yes    Alcohol/week: 0.0 standard drinks    Comment: glass of wine daily   Drug use: No   Sexual activity: Not on file  Other Topics Concern   Not on file  Social History Narrative   Not on file   Social Determinants of Health   Financial Resource Strain: Not on file  Food Insecurity: Not on file  Transportation Needs: Not on file  Physical Activity: Not on file  Stress: Not on file  Social Connections: Not on file     Review of Systems     Objective:     BP 122/74    Pulse 68    Temp  97.9 F (36.6 C)    Resp 16    Ht 5\' 11"  (1.803 m)    Wt 164 lb (74.4 kg)    SpO2 97%    BMI 22.87 kg/m  Wt Readings from Last 3 Encounters:  06/14/21 164 lb (74.4 kg)  03/15/21 169 lb (76.7 kg)  03/09/21 165 lb (74.8 kg)    Physical Exam   Outpatient Encounter Medications as of 06/14/2021  Medication Sig   aspirin 81 MG EC tablet Take 1 tablet by mouth daily.   calcium-vitamin D (OSCAL-500) 500-400 MG-UNIT tablet Take 1 tablet by mouth daily.    levothyroxine (SYNTHROID) 50 MCG tablet Take 50 mcg by mouth daily.   LUMIGAN 0.01 % SOLN SMARTSIG:In Eye(s)   Magnesium Oxide 400 (240 Mg) MG TABS    Multiple Vitamin (MULTIVITAMIN) tablet Take 1 tablet by mouth daily.   omeprazole (PRILOSEC) 20 MG capsule TAKE ONE CAPSULE BY MOUTH DAILY   venlafaxine XR (EFFEXOR-XR) 75 MG 24 hr capsule Take 1 capsule (75 mg total) by mouth daily.   [DISCONTINUED] benzonatate (TESSALON PERLES) 100 MG capsule Take 1 capsule (100 mg total) by  mouth 3 (three) times daily as needed for cough. (Patient not taking: Reported on 06/14/2021)   No facility-administered encounter medications on file as of 06/14/2021.     Lab Results  Component Value Date   WBC 4.8 03/08/2020   HGB 12.7 03/08/2020   HCT 38.0 03/08/2020   PLT 223.0 03/08/2020   GLUCOSE 98 03/08/2020   CHOL 218 (H) 03/08/2020   TRIG 80.0 03/08/2020   HDL 95.40 03/08/2020   LDLCALC 107 (H) 03/08/2020   ALT 16 03/08/2020   AST 24 03/08/2020   NA 141 03/08/2020   K 4.5 03/08/2020   CL 103 03/08/2020   CREATININE 0.68 03/08/2020   BUN 21 03/08/2020   CO2 32 03/08/2020   TSH 3.06 03/08/2020   INR 0.9 10/24/2018   HGBA1C 5.7 03/08/2020    MR BRAIN W WO CONTRAST  Result Date: 06/02/2021 CLINICAL DATA:  Infiltrating skull base mass, meningioma EXAM: MRI HEAD WITHOUT AND WITH CONTRAST TECHNIQUE: Multiplanar, multiecho pulse sequences of the brain and surrounding structures were obtained without and with intravenous contrast. CONTRAST:  7.21mL GADAVIST GADOBUTROL 1 MMOL/ML IV SOLN COMPARISON:  05/26/2020 FINDINGS: Brain: Infiltrating mass based along the dura of the right skull base, seen at the posterior clivus, right Meckel's cave, medial aspect of the right middle cranial fossa, right cavernous sinus, sella, suprasellar cistern, superior to the right anterior clinoid, right orbital apex, right sphenoid sinus and with dural thickening extending to the anterior margin of the right internal auditory canal and along the right tentorial leaflet. Intimate association with the basilar, infundibulum, chiasm/right optic nerve, cavernous sinus contents, right trigeminal nerve, and right ICA. Filling of the right Meckel's cave with enhancement extending to the foramen ovale. The degree of extent and associated mass effect is unchanged. The total mass cannot be accurately measured given irregular infiltrating shape; for continuity, the nodular component at the suprasellar cistern measures 2.3  x 1.6 cm, unchanged. No second mass is seen. No incidental infarct, hemorrhage, hydrocephalus, or collection. Mild chronic white matter disease is unchanged Vascular: Major flow voids and vascular enhancements are preserved Skull and upper cervical spine: Normal marrow signal Sinuses/Orbits: Chronic mild right proptosis related to orbital apex disease, as above. Right sphenoid sinus tumor infiltration. 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 Electronically Signed   By: Christiane Ha  Watts M.D.   On: 06/02/2021 13:07       Assessment & Plan:   Problem List Items Addressed This Visit     Health care maintenance    Physical today 06/14/21.  PAP 06/2017 - negative with negative HPV.  Mammogram 10/26/20 - Birads I. cologuard 01/2020 negative.       Other Visit Diagnoses     Encounter for screening mammogram for malignant neoplasm of breast    -  Primary   Relevant Orders   MM 3D SCREEN BREAST BILATERAL   Estrogen deficiency       Relevant Orders   DG Bone Density        Dale Clayton, MD

## 2021-06-14 NOTE — Assessment & Plan Note (Signed)
Physical today 06/14/21.  PAP 06/2017 - negative with negative HPV.  Mammogram 10/26/20 - Birads I. cologuard 01/2020 negative.  ?

## 2021-06-21 ENCOUNTER — Encounter: Payer: Self-pay | Admitting: Internal Medicine

## 2021-06-21 NOTE — Assessment & Plan Note (Signed)
On synthroid.  Follow tsh.   

## 2021-06-21 NOTE — Assessment & Plan Note (Signed)
Doing well on effexor.  Continue.   ?

## 2021-06-21 NOTE — Assessment & Plan Note (Signed)
No upper symptoms reported. On prilosec.  

## 2021-06-21 NOTE — Assessment & Plan Note (Signed)
Had f/u Dr Cook 06/07/21 - meningioma.  MrI - no interval growth.  Recommended MRI in 18 months.  

## 2021-06-21 NOTE — Assessment & Plan Note (Signed)
Low carb diet and exercise.  Follow met b and a1c.  ?

## 2021-06-23 DIAGNOSIS — H40053 Ocular hypertension, bilateral: Secondary | ICD-10-CM | POA: Diagnosis not present

## 2021-07-01 ENCOUNTER — Other Ambulatory Visit (INDEPENDENT_AMBULATORY_CARE_PROVIDER_SITE_OTHER): Payer: Medicare HMO

## 2021-07-01 ENCOUNTER — Other Ambulatory Visit: Payer: Self-pay

## 2021-07-01 DIAGNOSIS — Z13220 Encounter for screening for lipoid disorders: Secondary | ICD-10-CM | POA: Diagnosis not present

## 2021-07-01 DIAGNOSIS — E039 Hypothyroidism, unspecified: Secondary | ICD-10-CM | POA: Diagnosis not present

## 2021-07-01 DIAGNOSIS — R739 Hyperglycemia, unspecified: Secondary | ICD-10-CM

## 2021-07-01 LAB — COMPREHENSIVE METABOLIC PANEL
ALT: 18 U/L (ref 0–35)
AST: 25 U/L (ref 0–37)
Albumin: 4.5 g/dL (ref 3.5–5.2)
Alkaline Phosphatase: 100 U/L (ref 39–117)
BUN: 16 mg/dL (ref 6–23)
CO2: 33 mEq/L — ABNORMAL HIGH (ref 19–32)
Calcium: 9.3 mg/dL (ref 8.4–10.5)
Chloride: 101 mEq/L (ref 96–112)
Creatinine, Ser: 0.72 mg/dL (ref 0.40–1.20)
GFR: 85.84 mL/min (ref >60.00)
Glucose, Bld: 96 mg/dL (ref 70–99)
Potassium: 4.5 mEq/L (ref 3.5–5.1)
Sodium: 139 mEq/L (ref 135–145)
Total Bilirubin: 0.5 mg/dL (ref 0.2–1.2)
Total Protein: 6.7 g/dL (ref 6.0–8.3)

## 2021-07-01 LAB — CBC WITH DIFFERENTIAL/PLATELET
Basophils Absolute: 0.1 10*3/uL (ref 0.0–0.1)
Basophils Relative: 1.3 % (ref 0.0–3.0)
Eosinophils Absolute: 0.1 10*3/uL (ref 0.0–0.7)
Eosinophils Relative: 3.8 % (ref 0.0–5.0)
HCT: 36.1 % (ref 36.0–46.0)
Hemoglobin: 12.2 g/dL (ref 12.0–15.0)
Lymphocytes Relative: 30.1 % (ref 12.0–46.0)
Lymphs Abs: 1.1 10*3/uL (ref 0.7–4.0)
MCHC: 33.7 g/dL (ref 30.0–36.0)
MCV: 90.4 fl (ref 78.0–100.0)
Monocytes Absolute: 0.4 10*3/uL (ref 0.1–1.0)
Monocytes Relative: 10.2 % (ref 3.0–12.0)
Neutro Abs: 2.1 10*3/uL (ref 1.4–7.7)
Neutrophils Relative %: 54.6 % (ref 43.0–77.0)
Platelets: 228 10*3/uL (ref 150.0–400.0)
RBC: 3.99 Mil/uL (ref 3.87–5.11)
RDW: 13.9 % (ref 11.5–15.5)
WBC: 3.8 10*3/uL — ABNORMAL LOW (ref 4.0–10.5)

## 2021-07-01 LAB — LIPID PANEL
Cholesterol: 207 mg/dL — ABNORMAL HIGH (ref 0–200)
HDL: 87 mg/dL (ref >39.00)
LDL Cholesterol: 105 mg/dL — ABNORMAL HIGH (ref 0–99)
NonHDL: 119.95
Total CHOL/HDL Ratio: 2
Triglycerides: 75 mg/dL (ref 0.0–149.0)
VLDL: 15 mg/dL (ref 0.0–40.0)

## 2021-07-01 LAB — TSH: TSH: 2.04 u[IU]/mL (ref 0.35–5.50)

## 2021-07-01 LAB — HEMOGLOBIN A1C: Hgb A1c MFr Bld: 5.9 % (ref 4.6–6.5)

## 2021-07-05 ENCOUNTER — Telehealth: Payer: Self-pay | Admitting: Internal Medicine

## 2021-07-05 DIAGNOSIS — D509 Iron deficiency anemia, unspecified: Secondary | ICD-10-CM

## 2021-07-05 NOTE — Telephone Encounter (Signed)
Patient called and lab results were read. Please order a CBC per Dr Nicki Reaper. Patient will redo CBC next month. ?

## 2021-07-06 NOTE — Telephone Encounter (Signed)
Cbc ordered

## 2021-07-06 NOTE — Addendum Note (Signed)
Addended by: Lars Masson on: 07/06/2021 09:12 AM ? ? Modules accepted: Orders ? ?

## 2021-07-08 ENCOUNTER — Encounter: Payer: Self-pay | Admitting: *Deleted

## 2021-07-08 DIAGNOSIS — E221 Hyperprolactinemia: Secondary | ICD-10-CM | POA: Diagnosis not present

## 2021-07-08 DIAGNOSIS — D352 Benign neoplasm of pituitary gland: Secondary | ICD-10-CM | POA: Diagnosis not present

## 2021-07-08 DIAGNOSIS — E039 Hypothyroidism, unspecified: Secondary | ICD-10-CM | POA: Diagnosis not present

## 2021-07-13 DIAGNOSIS — Z8249 Family history of ischemic heart disease and other diseases of the circulatory system: Secondary | ICD-10-CM | POA: Diagnosis not present

## 2021-07-13 DIAGNOSIS — N951 Menopausal and female climacteric states: Secondary | ICD-10-CM | POA: Diagnosis not present

## 2021-07-13 DIAGNOSIS — H40053 Ocular hypertension, bilateral: Secondary | ICD-10-CM | POA: Diagnosis not present

## 2021-07-13 DIAGNOSIS — Z7982 Long term (current) use of aspirin: Secondary | ICD-10-CM | POA: Diagnosis not present

## 2021-07-13 DIAGNOSIS — Z823 Family history of stroke: Secondary | ICD-10-CM | POA: Diagnosis not present

## 2021-07-13 DIAGNOSIS — K219 Gastro-esophageal reflux disease without esophagitis: Secondary | ICD-10-CM | POA: Diagnosis not present

## 2021-07-13 DIAGNOSIS — Z809 Family history of malignant neoplasm, unspecified: Secondary | ICD-10-CM | POA: Diagnosis not present

## 2021-07-13 DIAGNOSIS — R030 Elevated blood-pressure reading, without diagnosis of hypertension: Secondary | ICD-10-CM | POA: Diagnosis not present

## 2021-07-13 DIAGNOSIS — E018 Other iodine-deficiency related thyroid disorders and allied conditions: Secondary | ICD-10-CM | POA: Diagnosis not present

## 2021-07-13 DIAGNOSIS — Z008 Encounter for other general examination: Secondary | ICD-10-CM | POA: Diagnosis not present

## 2021-07-22 DIAGNOSIS — E039 Hypothyroidism, unspecified: Secondary | ICD-10-CM | POA: Diagnosis not present

## 2021-07-22 DIAGNOSIS — D352 Benign neoplasm of pituitary gland: Secondary | ICD-10-CM | POA: Diagnosis not present

## 2021-07-22 DIAGNOSIS — E221 Hyperprolactinemia: Secondary | ICD-10-CM | POA: Diagnosis not present

## 2021-07-25 DIAGNOSIS — C44319 Basal cell carcinoma of skin of other parts of face: Secondary | ICD-10-CM | POA: Diagnosis not present

## 2021-08-05 ENCOUNTER — Other Ambulatory Visit (INDEPENDENT_AMBULATORY_CARE_PROVIDER_SITE_OTHER): Payer: Medicare HMO

## 2021-08-05 DIAGNOSIS — D509 Iron deficiency anemia, unspecified: Secondary | ICD-10-CM

## 2021-08-05 LAB — CBC WITH DIFFERENTIAL/PLATELET
Basophils Absolute: 0 10*3/uL (ref 0.0–0.1)
Basophils Relative: 1.1 % (ref 0.0–3.0)
Eosinophils Absolute: 0.1 10*3/uL (ref 0.0–0.7)
Eosinophils Relative: 2.2 % (ref 0.0–5.0)
HCT: 35.7 % — ABNORMAL LOW (ref 36.0–46.0)
Hemoglobin: 12.1 g/dL (ref 12.0–15.0)
Lymphocytes Relative: 24.5 % (ref 12.0–46.0)
Lymphs Abs: 1 10*3/uL (ref 0.7–4.0)
MCHC: 33.9 g/dL (ref 30.0–36.0)
MCV: 90.6 fl (ref 78.0–100.0)
Monocytes Absolute: 0.4 10*3/uL (ref 0.1–1.0)
Monocytes Relative: 10.3 % (ref 3.0–12.0)
Neutro Abs: 2.6 10*3/uL (ref 1.4–7.7)
Neutrophils Relative %: 61.9 % (ref 43.0–77.0)
Platelets: 241 10*3/uL (ref 150.0–400.0)
RBC: 3.94 Mil/uL (ref 3.87–5.11)
RDW: 13.8 % (ref 11.5–15.5)
WBC: 4.2 10*3/uL (ref 4.0–10.5)

## 2021-09-19 ENCOUNTER — Other Ambulatory Visit: Payer: Self-pay | Admitting: Internal Medicine

## 2021-09-29 ENCOUNTER — Other Ambulatory Visit: Payer: Self-pay | Admitting: Internal Medicine

## 2021-10-26 ENCOUNTER — Ambulatory Visit
Admission: RE | Admit: 2021-10-26 | Discharge: 2021-10-26 | Disposition: A | Discharge status: Home / Self Care | Payer: Medicare HMO | Source: Ambulatory Visit | Attending: Internal Medicine | Admitting: Internal Medicine

## 2021-10-26 DIAGNOSIS — E2839 Other primary ovarian failure: Secondary | ICD-10-CM | POA: Diagnosis not present

## 2021-10-26 DIAGNOSIS — M8589 Other specified disorders of bone density and structure, multiple sites: Secondary | ICD-10-CM | POA: Diagnosis not present

## 2021-10-26 DIAGNOSIS — Z1231 Encounter for screening mammogram for malignant neoplasm of breast: Secondary | ICD-10-CM

## 2021-10-26 DIAGNOSIS — Z780 Asymptomatic menopausal state: Secondary | ICD-10-CM | POA: Diagnosis not present

## 2021-10-29 ENCOUNTER — Encounter: Payer: Self-pay | Admitting: Internal Medicine

## 2021-11-08 ENCOUNTER — Encounter: Payer: Self-pay | Admitting: Dermatology

## 2021-11-08 ENCOUNTER — Ambulatory Visit (INDEPENDENT_AMBULATORY_CARE_PROVIDER_SITE_OTHER): Payer: Medicare HMO | Admitting: Dermatology

## 2021-11-08 DIAGNOSIS — D2271 Melanocytic nevi of right lower limb, including hip: Secondary | ICD-10-CM | POA: Diagnosis not present

## 2021-11-08 DIAGNOSIS — D229 Melanocytic nevi, unspecified: Secondary | ICD-10-CM | POA: Diagnosis not present

## 2021-11-08 DIAGNOSIS — Z85828 Personal history of other malignant neoplasm of skin: Secondary | ICD-10-CM

## 2021-11-08 DIAGNOSIS — D1800 Hemangioma unspecified site: Secondary | ICD-10-CM

## 2021-11-08 DIAGNOSIS — D492 Neoplasm of unspecified behavior of bone, soft tissue, and skin: Secondary | ICD-10-CM

## 2021-11-08 DIAGNOSIS — L578 Other skin changes due to chronic exposure to nonionizing radiation: Secondary | ICD-10-CM | POA: Diagnosis not present

## 2021-11-08 DIAGNOSIS — L821 Other seborrheic keratosis: Secondary | ICD-10-CM

## 2021-11-08 DIAGNOSIS — Z1283 Encounter for screening for malignant neoplasm of skin: Secondary | ICD-10-CM | POA: Diagnosis not present

## 2021-11-08 DIAGNOSIS — L720 Epidermal cyst: Secondary | ICD-10-CM | POA: Diagnosis not present

## 2021-11-08 DIAGNOSIS — C44311 Basal cell carcinoma of skin of nose: Secondary | ICD-10-CM

## 2021-11-08 DIAGNOSIS — L905 Scar conditions and fibrosis of skin: Secondary | ICD-10-CM | POA: Diagnosis not present

## 2021-11-08 DIAGNOSIS — L814 Other melanin hyperpigmentation: Secondary | ICD-10-CM

## 2021-11-08 DIAGNOSIS — L570 Actinic keratosis: Secondary | ICD-10-CM | POA: Diagnosis not present

## 2021-11-08 DIAGNOSIS — L820 Inflamed seborrheic keratosis: Secondary | ICD-10-CM | POA: Diagnosis not present

## 2021-11-08 NOTE — Progress Notes (Signed)
Follow-Up Visit   Subjective  Sarah Perry is a 71 y.o. female who presents for the following: Annual Exam (Here for skin cancer screening. Full body. Hx of Aks. Hx of multiple BCC's. Areas of concern today. Recheck AK treated on right neck at last visit). Spot on side is irritating.  The patient presents for Total-Body Skin Exam (TBSE) for skin cancer screening and mole check.  The patient has spots, moles and lesions to be evaluated, some may be new or changing and the patient has concerns that these could be cancer.   The following portions of the chart were reviewed this encounter and updated as appropriate:      Review of Systems: No other skin or systemic complaints except as noted in HPI or Assessment and Plan.   Objective  Well appearing patient in no apparent distress; mood and affect are within normal limits.  A full examination was performed including scalp, head, eyes, ears, nose, lips, neck, chest, axillae, abdomen, back, buttocks, bilateral upper extremities, bilateral lower extremities, hands, feet, fingers, toes, fingernails, and toenails. All findings within normal limits unless otherwise noted below.  Right Flank x1, mid chest and left wrist Edematous pink papule at mid chest and left wrist.  Erythematous keratotic papule at right flank.  Left Nasal Ala 3 mm pink papule     Left Oral Commissure Pink-white firm papule  Left Shoulder - Anterior Erythematous macule, slightly pearly  Right Thigh - Anterior 4 mm speckled brown macule at right lateral thigh     Left Paranasal 0.9 x 0.5 cm firm flesh flat papule   Assessment & Plan   History of Basal Cell Carcinoma of the Skin. Multiple, see history. - No evidence of recurrence today - Recommend regular full body skin exams - Recommend daily broad spectrum sunscreen SPF 30+ to sun-exposed areas, reapply every 2 hours as needed.  - Call if any new or changing lesions are noted between office visits    Lentigines. Torso - Scattered tan macules - Due to sun exposure - Benign-appearing, observe - Recommend daily broad spectrum sunscreen SPF 30+ to sun-exposed areas, reapply every 2 hours as needed. - Call for any changes  Seborrheic Keratoses. Torso - Stuck-on, waxy, tan-brown papules and/or plaques  - Benign-appearing - Discussed benign etiology and prognosis. - Observe - Call for any changes  Melanocytic Nevi - Tan-brown and/or pink-flesh-colored symmetric macules and papules - Benign appearing on exam today - Observation - Call clinic for new or changing moles - Recommend daily use of broad spectrum spf 30+ sunscreen to sun-exposed areas.   Hemangiomas - Red papules - Discussed benign nature - Observe - Call for any changes  Actinic Damage - Chronic condition, secondary to cumulative UV/sun exposure - diffuse scaly erythematous macules with underlying dyspigmentation - Recommend daily broad spectrum sunscreen SPF 30+ to sun-exposed areas, reapply every 2 hours as needed.  - Staying in the shade or wearing long sleeves, sun glasses (UVA+UVB protection) and wide brim hats (4-inch brim around the entire circumference of the hat) are also recommended for sun protection.  - Call for new or changing lesions.  Skin cancer screening performed today.  Sebaceous Hyperplasia. Forehead. - Small yellow papules with a central dell - Benign - Observe   Inflamed seborrheic keratosis (2) mid chest and left wrist; Right Flank x1  Vs bite reaction  Symptomatic, irritating, patient would like treated.  Apply Eucrisa ointment to area on chest and left wrist twice daily until resolved. Sample  given.  Destruction of lesion - Right Flank x1  Destruction method: cryotherapy   Informed consent: discussed and consent obtained   Lesion destroyed using liquid nitrogen: Yes   Region frozen until ice ball extended beyond lesion: Yes   Outcome: patient tolerated procedure well with no  complications   Post-procedure details: wound care instructions given   Additional details:  Prior to procedure, discussed risks of blister formation, small wound, skin dyspigmentation, or rare scar following cryotherapy. Recommend Vaseline ointment to treated areas while healing.   Neoplasm of skin Left Nasal Ala  Skin / nail biopsy Type of biopsy: tangential   Informed consent: discussed and consent obtained   Anesthesia: the lesion was anesthetized in a standard fashion   Anesthesia comment:  Area prepped with alcohol Anesthetic:  1% lidocaine w/ epinephrine 1-100,000 buffered w/ 8.4% NaHCO3 Instrument used: flexible razor blade   Hemostasis achieved with: pressure, aluminum chloride and electrodesiccation   Outcome: patient tolerated procedure well   Post-procedure details: wound care instructions given   Post-procedure details comment:  Ointment and small bandage applied  Specimen 1 - Surgical pathology Differential Diagnosis: Sebaceous hyperplasia, R/O BCC  Check Margins: No  [facility] at [facility]  Epidermal inclusion cyst Left Oral Commissure  Benign-appearing. Exam most consistent with an epidermal inclusion cyst. Discussed that a cyst is a benign growth that can grow over time and sometimes get irritated or inflamed. Recommend observation if it is not bothersome. Discussed option of surgical excision to remove it if it is growing, symptomatic, or other changes noted. Please call for new or changing lesions so they can be evaluated.    AK (actinic keratosis) Left Shoulder - Anterior  Vs early BCC  Actinic keratoses are precancerous spots that appear secondary to cumulative UV radiation exposure/sun exposure over time. They are chronic with expected duration over 1 year. A portion of actinic keratoses will progress to squamous cell carcinoma of the skin. It is not possible to reliably predict which spots will progress to skin cancer and so treatment is recommended to  prevent development of skin cancer.  Recommend daily broad spectrum sunscreen SPF 30+ to sun-exposed areas, reapply every 2 hours as needed.  Recommend staying in the shade or wearing long sleeves, sun glasses (UVA+UVB protection) and wide brim hats (4-inch brim around the entire circumference of the hat). Call for new or changing lesions.  Recheck on follow up.  Destruction of lesion - Left Shoulder - Anterior  Destruction method: cryotherapy   Informed consent: discussed and consent obtained   Lesion destroyed using liquid nitrogen: Yes   Region frozen until ice ball extended beyond lesion: Yes   Outcome: patient tolerated procedure well with no complications   Post-procedure details: wound care instructions given   Additional details:  Prior to procedure, discussed risks of blister formation, small wound, skin dyspigmentation, or rare scar following cryotherapy. Recommend Vaseline ointment to treated areas while healing.   Nevus Right Thigh - Anterior  Vs lentigo  Benign-appearing.  Observation.  Call clinic for new or changing lesions.  Recommend daily use of broad spectrum spf 30+ sunscreen to sun-exposed areas.    Scar Left Paranasal  Hypertrophic scar vrs other, came up after skin cancer surgery years ago, pt states no changes  Benign-appearing.  Observation.  Call clinic for new or changing lesions.  Recommend daily use of broad spectrum spf 30+ sunscreen to sun-exposed areas.     Return in about 6 months (around 05/11/2022) for TBSE, HxBCC's.  I, Emelia Salisbury, CMA, am acting as scribe for Brendolyn Patty, MD.  Documentation: I have reviewed the above documentation for accuracy and completeness, and I agree with the above.  Brendolyn Patty MD

## 2021-11-08 NOTE — Patient Instructions (Addendum)
Cryotherapy Aftercare  Wash gently with soap and water everyday.   Apply Vaseline daily until healed.    Apply Eucrisa ointment to area on chest and left arm twice daily until resolved.    Wound Care Instructions  Cleanse wound gently with soap and water once a day then pat dry with clean gauze. Apply a thing coat of Petrolatum (petroleum jelly, "Vaseline") over the wound (unless you have an allergy to this). We recommend that you use a new, sterile tube of Vaseline. Do not pick or remove scabs. Do not remove the yellow or white "healing tissue" from the base of the wound.  Cover the wound with fresh, clean, nonstick gauze and secure with paper tape. You may use Band-Aids in place of gauze and tape if the would is small enough, but would recommend trimming much of the tape off as there is often too much. Sometimes Band-Aids can irritate the skin.  You should call the office for your biopsy report after 1 week if you have not already been contacted.  If you experience any problems, such as abnormal amounts of bleeding, swelling, significant bruising, significant pain, or evidence of infection, please call the office immediately.  FOR ADULT SURGERY PATIENTS: If you need something for pain relief you may take 1 extra strength Tylenol (acetaminophen) AND 2 Ibuprofen ('200mg'$  each) together every 4 hours as needed for pain. (do not take these if you are allergic to them or if you have a reason you should not take them.) Typically, you may only need pain medication for 1 to 3 days.     Recommend daily broad spectrum sunscreen SPF 30+ to sun-exposed areas, reapply every 2 hours as needed. Call for new or changing lesions.  Staying in the shade or wearing long sleeves, sun glasses (UVA+UVB protection) and wide brim hats (4-inch brim around the entire circumference of the hat) are also recommended for sun protection.    Melanoma ABCDEs  Melanoma is the most dangerous type of skin cancer, and is the  leading cause of death from skin disease.  You are more likely to develop melanoma if you: Have light-colored skin, light-colored eyes, or red or blond hair Spend a lot of time in the sun Tan regularly, either outdoors or in a tanning bed Have had blistering sunburns, especially during childhood Have a close family member who has had a melanoma Have atypical moles or large birthmarks  Early detection of melanoma is key since treatment is typically straightforward and cure rates are extremely high if we catch it early.   The first sign of melanoma is often a change in a mole or a new dark spot.  The ABCDE system is a way of remembering the signs of melanoma.  A for asymmetry:  The two halves do not match. B for border:  The edges of the growth are irregular. C for color:  A mixture of colors are present instead of an even brown color. D for diameter:  Melanomas are usually (but not always) greater than 34m - the size of a pencil eraser. E for evolution:  The spot keeps changing in size, shape, and color.  Please check your skin once per month between visits. You can use a small mirror in front and a large mirror behind you to keep an eye on the back side or your body.   If you see any new or changing lesions before your next follow-up, please call to schedule a visit.  Please continue daily  skin protection including broad spectrum sunscreen SPF 30+ to sun-exposed areas, reapplying every 2 hours as needed when you're outdoors.   Staying in the shade or wearing long sleeves, sun glasses (UVA+UVB protection) and wide brim hats (4-inch brim around the entire circumference of the hat) are also recommended for sun protection.     Due to recent changes in healthcare laws, you may see results of your pathology and/or laboratory studies on MyChart before the doctors have had a chance to review them. We understand that in some cases there may be results that are confusing or concerning to you. Please  understand that not all results are received at the same time and often the doctors may need to interpret multiple results in order to provide you with the best plan of care or course of treatment. Therefore, we ask that you please give [place] 2 business days to thoroughly review all your results before contacting the office for clarification. Should we see a critical lab result, you will be contacted sooner.   If You Need Anything After Your Visit  If you have any questions or concerns for your doctor, please call our main line at [phone] and press option 4 to reach your doctor's medical assistant. If no one answers, please leave a voicemail as directed and we will return your call as soon as possible. Messages left after 4 pm will be answered the following business day.   You may also send [place] a message via [place]. We typically respond to MyChart messages within 1-2 business days.  For prescription refills, please ask your pharmacy to contact our office. Our fax number is [phone].  If you have an urgent issue when the clinic is closed that cannot wait until the next business day, you can page your doctor at the number below.    Please note that while we do our best to be available for urgent issues outside of office hours, we are not available 24/7.   If you have an urgent issue and are unable to reach [place], you may choose to seek medical care at your doctor's office, retail clinic, urgent care center, or emergency room.  If you have a medical emergency, please immediately call 911 or go to the emergency department.  Pager Numbers  - Dr. Nehemiah Massed: [phone]  - Dr. Laurence Ferrari: [phone]  - Dr. Nicole Kindred: [phone]  In the event of inclement weather, please call our main line at [phone] for an update on the status of any delays or closures.  Dermatology Medication Tips: Please keep the boxes that topical medications come in in order to help keep track of the instructions about  where and how to use these. Pharmacies typically print the medication instructions only on the boxes and not directly on the medication tubes.   If your medication is too expensive, please contact our office at [phone] option 4 or send [place] a message through [place].   We are unable to tell what your co-pay for medications will be in advance as this is different depending on your insurance coverage. However, we may be able to find a substitute medication at lower cost or fill out paperwork to get insurance to cover a needed medication.   If a prior authorization is required to get your medication covered by your insurance company, please allow [place] 1-2 business days to complete this process.  Drug prices often vary depending on where the prescription is filled and some pharmacies may offer cheaper prices.  The website  www.goodrx.com contains coupons for medications through different pharmacies. The prices here do not account for what the cost may be with help from insurance (it may be cheaper with your insurance), but the website can give you the price if you did not use any insurance.  - You can print the associated coupon and take it with your prescription to the pharmacy.  - You may also stop by our office during regular business hours and pick up a GoodRx coupon card.  - If you need your prescription sent electronically to a different pharmacy, notify our office through [facility] or by phone at [phone] option 4.     Si Usted Necesita Algo Despus de Su Visita  Tambin puede enviarnos un mensaje a travs de Pharmacist, community. Por lo general respondemos a los mensajes de MyChart en el transcurso de 1 a 2 das hbiles.  Para renovar recetas, por favor pida a su farmacia que se ponga en contacto con nuestra oficina. Harland Dingwall de fax es [place] [phone].  Si tiene un asunto urgente cuando la clnica est cerrada y que no puede esperar hasta el siguiente da hbil, puede  llamar/localizar a su doctor(a) al nmero que aparece a continuacin.   Por favor, tenga en cuenta que aunque hacemos todo lo posible para estar disponibles para asuntos urgentes fuera del horario de [place], no estamos disponibles las 24 horas del da, los 7 das de la [place].   Si tiene un problema urgente y no puede comunicarse con nosotros, puede optar por buscar atencin mdica  en el consultorio de su doctor(a), en una clnica privada, en un centro de atencin urgente o en una sala de emergencias.  Si tiene Engineering geologist, por favor llame inmediatamente al 911 o vaya a la sala de emergencias.  Nmeros de bper  - Dr. Nehemiah Massed: [phone]  - Dra. Moye: [phone]  - Dra. Nicole Kindred: [phone]  En caso de inclemencias del [place], por favor llame a Johnsie Kindred principal al [phone] para una actualizacin sobre el [place] de cualquier retraso o cierre.  Consejos para la medicacin en dermatologa: Por favor, guarde las cajas en las que vienen los medicamentos de uso tpico para ayudarle a seguir las instrucciones sobre dnde y cmo usarlos. Las farmacias generalmente imprimen las instrucciones del medicamento slo en las cajas y no directamente en los tubos del [place].   Si su medicamento es muy caro, por favor, pngase en contacto con Zigmund Daniel llamando al [phone] y presione la opcin 4 o envenos un mensaje a travs de Pharmacist, community.   No podemos decirle cul ser su copago por los medicamentos por adelantado ya que esto es diferente dependiendo de la cobertura de su seguro. Sin embargo, es posible que podamos encontrar un medicamento sustituto a Electrical engineer un formulario para que el seguro cubra el medicamento que se considera necesario.   Si se requiere una autorizacin previa para que su compaa de seguros [place] su medicamento, por favor permtanos de 1 a 2 das hbiles para completar este proceso.  Los precios de los medicamentos varan con  frecuencia dependiendo del Environmental consultant de dnde se surte la receta y alguna farmacias pueden ofrecer precios ms baratos.  El sitio web www.goodrx.com tiene cupones para medicamentos de Airline pilot. Los precios aqu no tienen en cuenta lo que podra costar con la ayuda del seguro (puede ser ms barato con su seguro), pero el sitio web puede darle el precio si no utiliz Research scientist (physical sciences).  - Puede imprimir  Puede imprimir el cupn correspondiente y llevarlo con su receta a la farmacia.  - Tambin puede pasar por nuestra oficina durante el horario de atencin regular y recoger una tarjeta de cupones de GoodRx.  - Si necesita que su receta se enve electrnicamente a una farmacia diferente, informe a nuestra oficina a travs de MyChart de [place] o por telfono llamando al [phone] y presione la opcin 4.  

## 2021-11-14 ENCOUNTER — Other Ambulatory Visit: Payer: Self-pay

## 2021-11-14 ENCOUNTER — Telehealth: Payer: Self-pay

## 2021-11-14 DIAGNOSIS — C44311 Basal cell carcinoma of skin of nose: Secondary | ICD-10-CM

## 2021-11-14 NOTE — Telephone Encounter (Signed)
Advised patient biopsy of the left nasal ala was BCC. Referral sent for Mohs to [facility].

## 2021-11-14 NOTE — Telephone Encounter (Signed)
-----   Message from Brendolyn Patty, MD sent at 11/14/2021  9:46 AM EDT ----- Skin , left nasal ala BASAL CELL CARCINOMA, NODULAR AND INFILTRATIVE PATTERNS, BASE INVOLVED  BCC skin cancer, needs Mohs at [facility]   - please call patient

## 2021-11-18 ENCOUNTER — Encounter: Payer: Self-pay | Admitting: Internal Medicine

## 2021-12-16 ENCOUNTER — Encounter: Payer: Self-pay | Admitting: Internal Medicine

## 2021-12-20 ENCOUNTER — Encounter: Payer: Self-pay | Admitting: Internal Medicine

## 2021-12-20 ENCOUNTER — Ambulatory Visit (INDEPENDENT_AMBULATORY_CARE_PROVIDER_SITE_OTHER): Payer: Medicare HMO | Admitting: Internal Medicine

## 2021-12-20 DIAGNOSIS — M8580 Other specified disorders of bone density and structure, unspecified site: Secondary | ICD-10-CM | POA: Diagnosis not present

## 2021-12-20 DIAGNOSIS — Z13220 Encounter for screening for lipoid disorders: Secondary | ICD-10-CM | POA: Diagnosis not present

## 2021-12-20 DIAGNOSIS — K219 Gastro-esophageal reflux disease without esophagitis: Secondary | ICD-10-CM

## 2021-12-20 DIAGNOSIS — D329 Benign neoplasm of meninges, unspecified: Secondary | ICD-10-CM

## 2021-12-20 DIAGNOSIS — R739 Hyperglycemia, unspecified: Secondary | ICD-10-CM

## 2021-12-20 DIAGNOSIS — E039 Hypothyroidism, unspecified: Secondary | ICD-10-CM | POA: Diagnosis not present

## 2021-12-20 LAB — CBC WITH DIFFERENTIAL/PLATELET
Basophils Absolute: 0.1 10*3/uL (ref 0.0–0.1)
Basophils Relative: 0.7 % (ref 0.0–3.0)
Eosinophils Absolute: 0.1 10*3/uL (ref 0.0–0.7)
Eosinophils Relative: 1.3 % (ref 0.0–5.0)
HCT: 37.4 % (ref 36.0–46.0)
Hemoglobin: 12.7 g/dL (ref 12.0–15.0)
Lymphocytes Relative: 18.3 % (ref 12.0–46.0)
Lymphs Abs: 1.4 10*3/uL (ref 0.7–4.0)
MCHC: 33.8 g/dL (ref 30.0–36.0)
MCV: 91.8 fl (ref 78.0–100.0)
Monocytes Absolute: 0.6 10*3/uL (ref 0.1–1.0)
Monocytes Relative: 7.8 % (ref 3.0–12.0)
Neutro Abs: 5.4 10*3/uL (ref 1.4–7.7)
Neutrophils Relative %: 71.9 % (ref 43.0–77.0)
Platelets: 249 10*3/uL (ref 150.0–400.0)
RBC: 4.07 Mil/uL (ref 3.87–5.11)
RDW: 13 % (ref 11.5–15.5)
WBC: 7.5 10*3/uL (ref 4.0–10.5)

## 2021-12-20 LAB — COMPREHENSIVE METABOLIC PANEL
ALT: 16 U/L (ref 0–35)
AST: 23 U/L (ref 0–37)
Albumin: 4.4 g/dL (ref 3.5–5.2)
Alkaline Phosphatase: 106 U/L (ref 39–117)
BUN: 15 mg/dL (ref 6–23)
CO2: 33 mEq/L — ABNORMAL HIGH (ref 19–32)
Calcium: 9.9 mg/dL (ref 8.4–10.5)
Chloride: 99 mEq/L (ref 96–112)
Creatinine, Ser: 0.85 mg/dL (ref 0.40–1.20)
GFR: 70.11 mL/min (ref >60.00)
Glucose, Bld: 91 mg/dL (ref 70–99)
Potassium: 4.6 mEq/L (ref 3.5–5.1)
Sodium: 139 mEq/L (ref 135–145)
Total Bilirubin: 0.6 mg/dL (ref 0.2–1.2)
Total Protein: 7 g/dL (ref 6.0–8.3)

## 2021-12-20 LAB — LIPID PANEL
Cholesterol: 220 mg/dL — ABNORMAL HIGH (ref 0–200)
HDL: 94.4 mg/dL (ref >39.00)
LDL Cholesterol: 111 mg/dL — ABNORMAL HIGH (ref 0–99)
NonHDL: 125.83
Total CHOL/HDL Ratio: 2
Triglycerides: 75 mg/dL (ref 0.0–149.0)
VLDL: 15 mg/dL (ref 0.0–40.0)

## 2021-12-20 LAB — HEMOGLOBIN A1C: Hgb A1c MFr Bld: 5.9 % (ref 4.6–6.5)

## 2021-12-20 NOTE — Progress Notes (Signed)
mouth daily.    levothyroxine (SYNTHROID) 50 MCG tablet Take 50 mcg by mouth daily.   LUMIGAN 0.01 % SOLN SMARTSIG:In Eye(s)   Magnesium Oxide 400 (240 Mg) MG TABS    Multiple Vitamin (MULTIVITAMIN) tablet Take 1 tablet by mouth daily.   omeprazole (PRILOSEC) 20 MG capsule TAKE ONE CAPSULE BY MOUTH DAILY   venlafaxine XR (EFFEXOR-XR) 75 MG 24 hr capsule TAKE ONE CAPSULE BY MOUTH DAILY   No facility-administered encounter medications on file as of 12/20/2021.     Lab Results  Component Value Date   WBC 7.5 12/20/2021   HGB 12.7 12/20/2021   HCT 37.4 12/20/2021   PLT 249.0 12/20/2021   GLUCOSE 91 12/20/2021   CHOL 220 (H) 12/20/2021   TRIG 75.0 12/20/2021   HDL 94.40 12/20/2021   LDLCALC 111 (H) 12/20/2021   ALT 16 12/20/2021   AST 23 12/20/2021   NA 139 12/20/2021   K 4.6 12/20/2021   CL 99 12/20/2021   CREATININE 0.85 12/20/2021   BUN 15 12/20/2021   CO2 33 (H) 12/20/2021   TSH 2.04 07/01/2021   INR 0.9 10/24/2018   HGBA1C 5.9 12/20/2021    MM 3D SCREEN BREAST BILATERAL  Result Date: 10/27/2021 CLINICAL DATA:  Screening. EXAM: DIGITAL SCREENING BILATERAL MAMMOGRAM WITH TOMOSYNTHESIS AND CAD TECHNIQUE: Bilateral screening digital craniocaudal and mediolateral oblique mammograms were obtained. Bilateral screening digital breast tomosynthesis was performed. The images were evaluated with computer-aided detection. COMPARISON:  Previous exam(s). ACR Breast Density Category c: The breast tissue is heterogeneously dense, which may obscure small masses. FINDINGS: There are no findings suspicious for malignancy. IMPRESSION: No mammographic evidence of malignancy. A result letter of this screening mammogram will be mailed directly to the patient. RECOMMENDATION: Screening mammogram in one year. (Code:SM-B-01Y) BI-RADS CATEGORY  1: Negative. Electronically Signed   By: Valentino Saxon M.D.    On: 10/27/2021 13:30   DG Bone Density  Result Date: 10/26/2021 EXAM: DUAL X-RAY ABSORPTIOMETRY (DXA) FOR BONE MINERAL DENSITY IMPRESSION: Dear Dr. Nicki Reaper, Your patient Stephenia Vogan Dobis completed a FRAX assessment on 10/26/2021 using the [employer] (analysis version: 14.10) manufactured by [employer]. The following summarizes the results of our evaluation. PATIENT BIOGRAPHICAL: Name: Desmond, Tufano Patient ID: [id number] Birth Date: [birth year] Height:    71.0 in. Gender:     [age]    [age]       Weight:    169.9 lbs. Ethnicity:  White                            Exam Date: 10/26/2021 FRAX* RESULTS:  (version: 3.5) 10-year Probability of Fracture1 Major Osteoporotic Fracture2 Hip Fracture 8.7% 0.9% Population: [place] (Caucasian) Risk Factors: None Based on Femur (Left) Neck BMD 1 -The 10-year probability of fracture may be lower than reported if the patient has received treatment. 2 -Major Osteoporotic Fracture: Clinical Spine, Forearm, Hip or Shoulder *FRAX is a Materials engineer of the [employer] for Metabolic Bone Disease, a [place] (WHO) [facility]. ASSESSMENT: The probability of a major osteoporotic fracture is 8.7% within the next ten years. The probability of a hip fracture is 0.9% within the next ten years. . Your patient Efrata Brunner completed a BMD test on 10/26/2021 using the [employer] (software version: 14.10) manufactured by [employer]. The following summarizes the results of our evaluation. Technologist:  mouth daily.    levothyroxine (SYNTHROID) 50 MCG tablet Take 50 mcg by mouth daily.   LUMIGAN 0.01 % SOLN SMARTSIG:In Eye(s)   Magnesium Oxide 400 (240 Mg) MG TABS    Multiple Vitamin (MULTIVITAMIN) tablet Take 1 tablet by mouth daily.   omeprazole (PRILOSEC) 20 MG capsule TAKE ONE CAPSULE BY MOUTH DAILY   venlafaxine XR (EFFEXOR-XR) 75 MG 24 hr capsule TAKE ONE CAPSULE BY MOUTH DAILY   No facility-administered encounter medications on file as of 12/20/2021.     Lab Results  Component Value Date   WBC 7.5 12/20/2021   HGB 12.7 12/20/2021   HCT 37.4 12/20/2021   PLT 249.0 12/20/2021   GLUCOSE 91 12/20/2021   CHOL 220 (H) 12/20/2021   TRIG 75.0 12/20/2021   HDL 94.40 12/20/2021   LDLCALC 111 (H) 12/20/2021   ALT 16 12/20/2021   AST 23 12/20/2021   NA 139 12/20/2021   K 4.6 12/20/2021   CL 99 12/20/2021   CREATININE 0.85 12/20/2021   BUN 15 12/20/2021   CO2 33 (H) 12/20/2021   TSH 2.04 07/01/2021   INR 0.9 10/24/2018   HGBA1C 5.9 12/20/2021    MM 3D SCREEN BREAST BILATERAL  Result Date: 10/27/2021 CLINICAL DATA:  Screening. EXAM: DIGITAL SCREENING BILATERAL MAMMOGRAM WITH TOMOSYNTHESIS AND CAD TECHNIQUE: Bilateral screening digital craniocaudal and mediolateral oblique mammograms were obtained. Bilateral screening digital breast tomosynthesis was performed. The images were evaluated with computer-aided detection. COMPARISON:  Previous exam(s). ACR Breast Density Category c: The breast tissue is heterogeneously dense, which may obscure small masses. FINDINGS: There are no findings suspicious for malignancy. IMPRESSION: No mammographic evidence of malignancy. A result letter of this screening mammogram will be mailed directly to the patient. RECOMMENDATION: Screening mammogram in one year. (Code:SM-B-01Y) BI-RADS CATEGORY  1: Negative. Electronically Signed   By: Valentino Saxon M.D.    On: 10/27/2021 13:30   DG Bone Density  Result Date: 10/26/2021 EXAM: DUAL X-RAY ABSORPTIOMETRY (DXA) FOR BONE MINERAL DENSITY IMPRESSION: Dear Dr. Nicki Reaper, Your patient Stephenia Vogan Dobis completed a FRAX assessment on 10/26/2021 using the [employer] (analysis version: 14.10) manufactured by [employer]. The following summarizes the results of our evaluation. PATIENT BIOGRAPHICAL: Name: Desmond, Tufano Patient ID: [id number] Birth Date: [birth year] Height:    71.0 in. Gender:     Female    Age:        70.8       Weight:    169.9 lbs. Ethnicity:  White                            Exam Date: 10/26/2021 FRAX* RESULTS:  (version: 3.5) 10-year Probability of Fracture1 Major Osteoporotic Fracture2 Hip Fracture 8.7% 0.9% Population: [place] (Caucasian) Risk Factors: None Based on Femur (Left) Neck BMD 1 -The 10-year probability of fracture may be lower than reported if the patient has received treatment. 2 -Major Osteoporotic Fracture: Clinical Spine, Forearm, Hip or Shoulder *FRAX is a Materials engineer of the [employer] for Metabolic Bone Disease, a [place] (WHO) [facility]. ASSESSMENT: The probability of a major osteoporotic fracture is 8.7% within the next ten years. The probability of a hip fracture is 0.9% within the next ten years. . Your patient Efrata Brunner completed a BMD test on 10/26/2021 using the [employer] (software version: 14.10) manufactured by [employer]. The following summarizes the results of our evaluation. Technologist:  [facility] PATIENT BIOGRAPHICAL: Name: Marlynn, Hinckley Patient ID: [id number] Birth Date: Apr 11, 1950 Height: 71.0 in. Gender: Female Exam Date: 10/26/2021 Weight: 169.9 lbs. Indications: Caucasian, Postmenopausal Fractures: Treatments: Calcium, Vitamin D DENSITOMETRY RESULTS: Site         Region     Measured Date Measured Age WHO Classification Young Adult T-score BMD         %Change vs. Previous Significant  Change (*) DualFemur Neck Left 10/26/2021 68.8 Osteopenia -1.1 0.883 g/cm2 -3.7% - DualFemur Neck Left 10/29/2008 55.8 Normal -0.9 0.917 g/cm2 1.4% - DualFemur Neck Left 08/17/2006 53.6 Normal -1.0 0.904 g/cm2 - - DualFemur Total Mean 10/26/2021 68.8 Normal -0.1 0.994 g/cm2 -2.8% Yes DualFemur Total Mean 10/29/2008 55.8 Normal 0.1 1.023 g/cm2 -0.4% - DualFemur Total Mean 08/17/2006 53.6 Normal 0.2 1.027 g/cm2 - - Left Forearm Radius 33% 10/26/2021 68.8 Osteopenia -2.0 0.705 g/cm2 -15.6% Yes Left Forearm Radius 33% 10/29/2008 55.8 Normal -0.5 0.835 g/cm2 -4.4% Yes Left Forearm Radius 33% 08/17/2006 53.6 Normal 0.0 0.873 g/cm2 - - ASSESSMENT: The BMD measured at Forearm Radius 33% is 0.705 g/cm2 with a T-score of -2.0. This patient is considered osteopenic according to [facility]) criteria. The scan quality is good. Lumbar spine was not utilized due to advanced degenerative changes. Compared with prior study, there has been significant decrease in the total hip. World Pharmacologist [facility]) criteria for post-menopausal, Caucasian Women: Normal:                   T-score at or above -1 SD Osteopenia/low bone mass: T-score between -1 and -2.5 SD Osteoporosis:             T-score at or below -2.5 SD RECOMMENDATIONS: 1. All patients should optimize calcium and vitamin D intake. 2. Consider FDA-approved medical therapies in postmenopausal women and men aged 82 years and older, based on the following: a. A hip or vertebral(clinical or morphometric) fracture b. T-score < -2.5 at the femoral neck or spine after appropriate evaluation to exclude secondary causes c. Low bone mass (T-score between -1.0 and -2.5 at the femoral neck or spine) and a 10-year probability of a hip fracture > 3% or a 10-year probability of a major osteoporosis-related fracture > 20% based on the [place]-adapted WHO algorithm 3. Clinician judgment and/or patient preferences may indicate treatment for people with 10-year fracture  probabilities above or below these levels FOLLOW-UP: People with diagnosed cases of osteoporosis or at high risk for fracture should have regular bone mineral density tests. For patients eligible for Medicare, routine testing is allowed once every 2 years. The testing frequency can be increased to one year for patients who have rapidly progressing disease, those who are receiving or discontinuing medical therapy to restore bone mass, or have additional risk factors. I have reviewed this report, and agree with the above findings. [facility] Electronically Signed   By: Zerita Boers M.D.   On: 10/26/2021 11:07       Assessment & Plan:   Problem List Items Addressed This Visit     GERD (gastroesophageal reflux disease)    No upper symptoms reported.  On prilosec.       Hyperglycemia    Low carb diet and exercise.  Follow met b and a1c.       Relevant Orders   Comprehensive metabolic panel (Completed)   Hemoglobin A1c (Completed)   Hypothyroid    On synthroid.  Follow tsh.       Relevant Orders  mouth daily.    levothyroxine (SYNTHROID) 50 MCG tablet Take 50 mcg by mouth daily.   LUMIGAN 0.01 % SOLN SMARTSIG:In Eye(s)   Magnesium Oxide 400 (240 Mg) MG TABS    Multiple Vitamin (MULTIVITAMIN) tablet Take 1 tablet by mouth daily.   omeprazole (PRILOSEC) 20 MG capsule TAKE ONE CAPSULE BY MOUTH DAILY   venlafaxine XR (EFFEXOR-XR) 75 MG 24 hr capsule TAKE ONE CAPSULE BY MOUTH DAILY   No facility-administered encounter medications on file as of 12/20/2021.     Lab Results  Component Value Date   WBC 7.5 12/20/2021   HGB 12.7 12/20/2021   HCT 37.4 12/20/2021   PLT 249.0 12/20/2021   GLUCOSE 91 12/20/2021   CHOL 220 (H) 12/20/2021   TRIG 75.0 12/20/2021   HDL 94.40 12/20/2021   LDLCALC 111 (H) 12/20/2021   ALT 16 12/20/2021   AST 23 12/20/2021   NA 139 12/20/2021   K 4.6 12/20/2021   CL 99 12/20/2021   CREATININE 0.85 12/20/2021   BUN 15 12/20/2021   CO2 33 (H) 12/20/2021   TSH 2.04 07/01/2021   INR 0.9 10/24/2018   HGBA1C 5.9 12/20/2021    MM 3D SCREEN BREAST BILATERAL  Result Date: 10/27/2021 CLINICAL DATA:  Screening. EXAM: DIGITAL SCREENING BILATERAL MAMMOGRAM WITH TOMOSYNTHESIS AND CAD TECHNIQUE: Bilateral screening digital craniocaudal and mediolateral oblique mammograms were obtained. Bilateral screening digital breast tomosynthesis was performed. The images were evaluated with computer-aided detection. COMPARISON:  Previous exam(s). ACR Breast Density Category c: The breast tissue is heterogeneously dense, which may obscure small masses. FINDINGS: There are no findings suspicious for malignancy. IMPRESSION: No mammographic evidence of malignancy. A result letter of this screening mammogram will be mailed directly to the patient. RECOMMENDATION: Screening mammogram in one year. (Code:SM-B-01Y) BI-RADS CATEGORY  1: Negative. Electronically Signed   By: Valentino Saxon M.D.    On: 10/27/2021 13:30   DG Bone Density  Result Date: 10/26/2021 EXAM: DUAL X-RAY ABSORPTIOMETRY (DXA) FOR BONE MINERAL DENSITY IMPRESSION: Dear Dr. Nicki Reaper, Your patient Stephenia Vogan Dobis completed a FRAX assessment on 10/26/2021 using the [employer] (analysis version: 14.10) manufactured by [employer]. The following summarizes the results of our evaluation. PATIENT BIOGRAPHICAL: Name: Desmond, Tufano Patient ID: [id number] Birth Date: [birth year] Height:    71.0 in. Gender:     Female    [age]        70.8       Weight:    169.9 lbs. Ethnicity:  White                            Exam Date: 10/26/2021 FRAX* RESULTS:  (version: 3.5) 10-year Probability of Fracture1 Major Osteoporotic Fracture2 Hip Fracture 8.7% 0.9% Population: [place] (Caucasian) Risk Factors: None Based on Femur (Left) Neck BMD 1 -The 10-year probability of fracture may be lower than reported if the patient has received treatment. 2 -Major Osteoporotic Fracture: Clinical Spine, Forearm, Hip or Shoulder *FRAX is a Materials engineer of the [employer] for Metabolic Bone Disease, a [place] (WHO) [facility]. ASSESSMENT: The probability of a major osteoporotic fracture is 8.7% within the next ten years. The probability of a hip fracture is 0.9% within the next ten years. . Your patient Efrata Brunner completed a BMD test on 10/26/2021 using the [employer] (software version: 14.10) manufactured by [employer]. The following summarizes the results of our evaluation. Technologist:  [facility] PATIENT BIOGRAPHICAL: Name: Marlynn, Hinckley Patient ID: [id number] Birth Date: Apr 11, 1950 Height: 71.0 in. Gender: Female Exam Date: 10/26/2021 Weight: 169.9 lbs. Indications: Caucasian, Postmenopausal Fractures: Treatments: Calcium, Vitamin D DENSITOMETRY RESULTS: Site         Region     Measured Date Measured Age WHO Classification Young Adult T-score BMD         %Change vs. Previous Significant  Change (*) DualFemur Neck Left 10/26/2021 68.8 Osteopenia -1.1 0.883 g/cm2 -3.7% - DualFemur Neck Left 10/29/2008 55.8 Normal -0.9 0.917 g/cm2 1.4% - DualFemur Neck Left 08/17/2006 53.6 Normal -1.0 0.904 g/cm2 - - DualFemur Total Mean 10/26/2021 68.8 Normal -0.1 0.994 g/cm2 -2.8% Yes DualFemur Total Mean 10/29/2008 55.8 Normal 0.1 1.023 g/cm2 -0.4% - DualFemur Total Mean 08/17/2006 53.6 Normal 0.2 1.027 g/cm2 - - Left Forearm Radius 33% 10/26/2021 68.8 Osteopenia -2.0 0.705 g/cm2 -15.6% Yes Left Forearm Radius 33% 10/29/2008 55.8 Normal -0.5 0.835 g/cm2 -4.4% Yes Left Forearm Radius 33% 08/17/2006 53.6 Normal 0.0 0.873 g/cm2 - - ASSESSMENT: The BMD measured at Forearm Radius 33% is 0.705 g/cm2 with a T-score of -2.0. This patient is considered osteopenic according to [facility]) criteria. The scan quality is good. Lumbar spine was not utilized due to advanced degenerative changes. Compared with prior study, there has been significant decrease in the total hip. World Pharmacologist [facility]) criteria for post-menopausal, Caucasian Women: Normal:                   T-score at or above -1 SD Osteopenia/low bone mass: T-score between -1 and -2.5 SD Osteoporosis:             T-score at or below -2.5 SD RECOMMENDATIONS: 1. All patients should optimize calcium and vitamin D intake. 2. Consider FDA-approved medical therapies in postmenopausal women and men aged 82 years and older, based on the following: a. A hip or vertebral(clinical or morphometric) fracture b. T-score < -2.5 at the femoral neck or spine after appropriate evaluation to exclude secondary causes c. Low bone mass (T-score between -1.0 and -2.5 at the femoral neck or spine) and a 10-year probability of a hip fracture > 3% or a 10-year probability of a major osteoporosis-related fracture > 20% based on the [place]-adapted WHO algorithm 3. Clinician judgment and/or patient preferences may indicate treatment for people with 10-year fracture  probabilities above or below these levels FOLLOW-UP: People with diagnosed cases of osteoporosis or at high risk for fracture should have regular bone mineral density tests. For patients eligible for Medicare, routine testing is allowed once every 2 years. The testing frequency can be increased to one year for patients who have rapidly progressing disease, those who are receiving or discontinuing medical therapy to restore bone mass, or have additional risk factors. I have reviewed this report, and agree with the above findings. [facility] Electronically Signed   By: Zerita Boers M.D.   On: 10/26/2021 11:07       Assessment & Plan:   Problem List Items Addressed This Visit     GERD (gastroesophageal reflux disease)    No upper symptoms reported.  On prilosec.       Hyperglycemia    Low carb diet and exercise.  Follow met b and a1c.       Relevant Orders   Comprehensive metabolic panel (Completed)   Hemoglobin A1c (Completed)   Hypothyroid    On synthroid.  Follow tsh.       Relevant Orders

## 2021-12-26 DIAGNOSIS — H40053 Ocular hypertension, bilateral: Secondary | ICD-10-CM | POA: Diagnosis not present

## 2021-12-27 ENCOUNTER — Telehealth: Payer: Self-pay

## 2021-12-27 NOTE — Telephone Encounter (Signed)
Pt called and found a spot on nose that she was wondering if Dr. Lacinda Axon could look at when she goes for Mohs.  I advised that he would not address a new spot and we could get her scheduled with Dr. Nicole Kindred to have checked.  Scheduled pt for 01/04/22 at 9:00./sh

## 2021-12-31 ENCOUNTER — Encounter: Payer: Self-pay | Admitting: Internal Medicine

## 2021-12-31 DIAGNOSIS — M8580 Other specified disorders of bone density and structure, unspecified site: Secondary | ICD-10-CM | POA: Insufficient documentation

## 2021-12-31 NOTE — Assessment & Plan Note (Signed)
No upper symptoms reported. On prilosec.  

## 2021-12-31 NOTE — Assessment & Plan Note (Signed)
Low carb diet and exercise.  Follow met b and a1c.  

## 2021-12-31 NOTE — Assessment & Plan Note (Signed)
Had f/u Dr Lacinda Axon 06/07/21 - meningioma.  MrI - no interval growth.  Recommended MRI in 18 months.

## 2021-12-31 NOTE — Assessment & Plan Note (Signed)
Discussed bone density results.  Calcium, vitamin D and weight bearing exercise.

## 2021-12-31 NOTE — Assessment & Plan Note (Signed)
On synthroid.  Follow tsh.   

## 2022-01-04 ENCOUNTER — Encounter: Payer: Self-pay | Admitting: Dermatology

## 2022-01-04 ENCOUNTER — Ambulatory Visit: Payer: Medicare HMO | Admitting: Dermatology

## 2022-01-04 DIAGNOSIS — D485 Neoplasm of uncertain behavior of skin: Secondary | ICD-10-CM

## 2022-01-04 DIAGNOSIS — L578 Other skin changes due to chronic exposure to nonionizing radiation: Secondary | ICD-10-CM | POA: Diagnosis not present

## 2022-01-04 DIAGNOSIS — D492 Neoplasm of unspecified behavior of bone, soft tissue, and skin: Secondary | ICD-10-CM

## 2022-01-04 NOTE — Progress Notes (Signed)
   Follow-Up Visit   Subjective  Sarah Perry is a 71 y.o. female who presents for the following: lesion (Patient has noticed another lesion at left nasal ala. 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 Patient concerned about this pink spot).  The patient has spots, moles and lesions to be evaluated, some may be new or changing and the patient has concerns that these could be cancer.   The following portions of the chart were reviewed this encounter and updated as appropriate:      Review of Systems: No other skin or systemic complaints except as noted in HPI or Assessment and Plan.   Objective  Well appearing patient in no apparent distress; mood and affect are within normal limits.  A focused examination was performed including face. Relevant physical exam findings are noted in the Assessment and Plan.  Left Ala Nasi, anterior Pink scaly macule, visible on previous photo, appears separate from biopsy site   Assessment & Plan  Neoplasm of skin Left Ala Nasi, anterior  ISK vs [place] vs [facility] vs early [facility]  Keep scheduled appt with Dr. Lacinda Axon next week for Mohs surgery. Advised this lesion could be part of the [facility] biopsied 11/08/2021 since it is so close, but not sure. Will not biopsy today due to Mohs appointment scheduled last week.  Biopsy from 2019 showed ISK at left anterior alar crease but no photo to compare if it is the same spot. Reviewed pathology with patient.    Actinic Damage - chronic, secondary to cumulative UV radiation exposure/sun exposure over time - diffuse scaly erythematous macules with underlying dyspigmentation - Recommend daily broad spectrum sunscreen SPF 30+ to sun-exposed areas, reapply every 2 hours as needed.  - Recommend staying in the shade or wearing long sleeves, sun glasses (UVA+UVB protection) and wide brim hats (4-inch brim around the entire circumference of the hat). - Call for new or changing lesions.    Return for Follow Up As Scheduled.  I, Emelia Salisbury, CMA, am acting as scribe for Brendolyn Patty, MD.  Documentation: I have reviewed the above documentation for accuracy and completeness, and I agree with the above.  Brendolyn Patty MD

## 2022-01-04 NOTE — Patient Instructions (Signed)
Recommend daily broad spectrum sunscreen SPF 30+ to sun-exposed areas, reapply every 2 hours as needed. Call for new or changing lesions.  Staying in the shade or wearing long sleeves, sun glasses (UVA+UVB protection) and wide brim hats (4-inch brim around the entire circumference of the hat) are also recommended for sun protection.        Due to recent changes in healthcare laws, you may see results of your pathology and/or laboratory studies on MyChart before the doctors have had a chance to review them. We understand that in some cases there may be results that are confusing or concerning to you. Please understand that not all results are received at the same time and often the doctors may need to interpret multiple results in order to provide you with the best plan of care or course of treatment. Therefore, we ask that you please give us 2 business days to thoroughly review all your results before contacting the office for clarification. Should we see a critical lab result, you will be contacted sooner.   If You Need Anything After Your Visit  If you have any questions or concerns for your doctor, please call our main line at [phone] and press option 4 to reach your doctor's medical assistant. If no one answers, please leave a voicemail as directed and we will return your call as soon as possible. Messages left after 4 pm will be answered the following business day.   You may also send us a message via MyChart. We typically respond to MyChart messages within 1-2 business days.  For prescription refills, please ask your pharmacy to contact our office. Our fax number is [phone].  If you have an urgent issue when the clinic is closed that cannot wait until the next business day, you can page your doctor at the number below.    Please note that while we do our best to be available for urgent issues outside of office hours, we are not available 24/7.   If you have an urgent issue and  are unable to reach us, you may choose to seek medical care at your doctor's office, retail clinic, urgent care center, or emergency room.  If you have a medical emergency, please immediately call 911 or go to the emergency department.  Pager Numbers  - Dr. Kowalski: [phone]  - Dr. Moye: [phone]  - Dr. Stewart: [phone]  In the event of inclement weather, please call our main line at [phone] for an update on the status of any delays or closures.  Dermatology Medication Tips: Please keep the boxes that topical medications come in in order to help keep track of the instructions about where and how to use these. Pharmacies typically print the medication instructions only on the boxes and not directly on the medication tubes.   If your medication is too expensive, please contact our office at [phone] option 4 or send us a message through MyChart.   We are unable to tell what your co-pay for medications will be in advance as this is different depending on your insurance coverage. However, we may be able to find a substitute medication at lower cost or fill out paperwork to get insurance to cover a needed medication.   If a prior authorization is required to get your medication covered by your insurance company, please allow us 1-2 business days to complete this process.  Drug prices often vary depending on where the prescription is filled and some pharmacies may offer cheaper prices.    website www.goodrx.com contains coupons for medications through different pharmacies. The prices here do not account for what the cost may be with help from insurance (it may be cheaper with your insurance), but the website can give you the price if you did not use any insurance.  - You can print the associated coupon and take it with your prescription to the pharmacy.  - You may also stop by our office during regular business hours and pick up a GoodRx coupon card.  - If you need your  prescription sent electronically to a different pharmacy, notify our office through [facility] MyChart or by phone at [phone] option 4.     Si Usted Necesita Algo Despus de Su Visita  Tambin puede enviarnos un mensaje a travs de MyChart. Por lo general respondemos a los mensajes de MyChart en el transcurso de 1 a 2 das hbiles.  Para renovar recetas, por favor pida a su farmacia que se ponga en contacto con nuestra oficina. Nuestro nmero de fax es el [phone].  Si tiene un asunto urgente cuando la clnica est cerrada y que no puede esperar hasta el siguiente da hbil, puede llamar/localizar a su doctor(a) al nmero que aparece a continuacin.   Por favor, tenga en cuenta que aunque hacemos todo lo posible para estar disponibles para asuntos urgentes fuera del horario de oficina, no estamos disponibles las 24 horas del da, los 7 das de la semana.   Si tiene un problema urgente y no puede comunicarse con nosotros, puede optar por buscar atencin mdica  en el consultorio de su doctor(a), en una clnica privada, en un centro de atencin urgente o en una sala de emergencias.  Si tiene una emergencia mdica, por favor llame inmediatamente al 911 o vaya a la sala de emergencias.  Nmeros de bper  - Dr. Kowalski: [phone]  - Dra. Moye: [phone]  - Dra. Stewart: [phone]  En caso de inclemencias del tiempo, por favor llame a nuestra lnea principal al [phone] para una actualizacin sobre el estado de cualquier retraso o cierre.  Consejos para la medicacin en dermatologa: Por favor, guarde las cajas en las que vienen los medicamentos de uso tpico para ayudarle a seguir las instrucciones sobre dnde y cmo usarlos. Las farmacias generalmente imprimen las instrucciones del medicamento slo en las cajas y no directamente en los tubos del medicamento.   Si su medicamento es muy caro, por favor, pngase en contacto con nuestra oficina llamando al [phone]  y presione la opcin 4 o envenos un mensaje a travs de MyChart.   No podemos decirle cul ser su copago por los medicamentos por adelantado ya que esto es diferente dependiendo de la cobertura de su seguro. Sin embargo, es posible que podamos encontrar un medicamento sustituto a menor costo o llenar un formulario para que el seguro cubra el medicamento que se considera necesario.   Si se requiere una autorizacin previa para que su compaa de seguros cubra su medicamento, por favor permtanos de 1 a 2 das hbiles para completar este proceso.  Los precios de los medicamentos varan con frecuencia dependiendo del lugar de dnde se surte la receta y alguna farmacias pueden ofrecer precios ms baratos.  El sitio web www.goodrx.com tiene cupones para medicamentos de diferentes farmacias. Los precios aqu no tienen en cuenta lo que podra costar con la ayuda del seguro (puede ser ms barato con su seguro), pero el sitio web puede darle el precio si no utiliz ningn seguro.  - Puede   Puede imprimir el cupn correspondiente y llevarlo con su receta a la farmacia.  - Tambin puede pasar por nuestra oficina durante el horario de atencin regular y recoger una tarjeta de cupones de GoodRx.  - Si necesita que su receta se enve electrnicamente a una farmacia diferente, informe a nuestra oficina a travs de MyChart de [place] o por telfono llamando al [phone] y presione la opcin 4.  

## 2022-01-09 DIAGNOSIS — C44311 Basal cell carcinoma of skin of nose: Secondary | ICD-10-CM | POA: Diagnosis not present

## 2022-01-16 DIAGNOSIS — E039 Hypothyroidism, unspecified: Secondary | ICD-10-CM | POA: Diagnosis not present

## 2022-01-16 DIAGNOSIS — E221 Hyperprolactinemia: Secondary | ICD-10-CM | POA: Diagnosis not present

## 2022-01-23 DIAGNOSIS — E221 Hyperprolactinemia: Secondary | ICD-10-CM | POA: Diagnosis not present

## 2022-01-23 DIAGNOSIS — D352 Benign neoplasm of pituitary gland: Secondary | ICD-10-CM | POA: Diagnosis not present

## 2022-01-23 DIAGNOSIS — E039 Hypothyroidism, unspecified: Secondary | ICD-10-CM | POA: Diagnosis not present

## 2022-01-31 ENCOUNTER — Encounter: Payer: Self-pay | Admitting: Internal Medicine

## 2022-02-25 ENCOUNTER — Encounter: Payer: Self-pay | Admitting: Internal Medicine

## 2022-03-08 ENCOUNTER — Encounter: Payer: Self-pay | Admitting: Internal Medicine

## 2022-03-09 ENCOUNTER — Telehealth (INDEPENDENT_AMBULATORY_CARE_PROVIDER_SITE_OTHER): Payer: Medicare HMO | Admitting: Family Medicine

## 2022-03-09 ENCOUNTER — Encounter: Payer: Self-pay | Admitting: Family Medicine

## 2022-03-09 VITALS — Temp 100.1°F | Wt 170.0 lb

## 2022-03-09 DIAGNOSIS — U071 COVID-19: Secondary | ICD-10-CM | POA: Diagnosis not present

## 2022-03-09 MED ORDER — NIRMATRELVIR/RITONAVIR (PAXLOVID)TABLET: 3.0000 | ORAL_TABLET | Freq: Two times a day (BID) | ORAL | 0 refills | Status: AC

## 2022-03-09 MED ORDER — BENZONATATE 100 MG PO CAPS: ORAL_CAPSULE | ORAL | 0 refills | Status: DC

## 2022-03-09 NOTE — Progress Notes (Signed)
Virtual Visit via Video Note  I connected with Sarah Perry  on 03/09/22 at  1:20 PM EST by a video enabled telemedicine application and verified that I am speaking with the correct person using two identifiers.  Location patient: [place] Location provider:work or home office Persons participating in the virtual visit: patient, provider  I discussed the limitations and requested verbal permission for telemedicine visit. The patient expressed understanding and agreed to proceed.   HPI:  Acute telemedicine visit for Covid19: -Onset: 2 days ago, tested positive on Tuesday -Symptoms include: nasal congestion, cough, low grade fever, body aches -Denies: CP, SOB, NVD -Has tried: aleve -Pertinent past medical history: see below, never had covid before, GFR 70 2 months ago -Pertinent medication allergies: Allergies  Allergen Reactions   Penicillins Other (See Comments)  -COVID-19 vaccine status:  Immunization History  Administered Date(s) Administered   Influenza Split 01/06/2013, 12/30/2013, 01/05/2015   Influenza, High Dose Seasonal PF 11/28/2018   Influenza,inj,Quad PF,6+ Mos 12/12/2017   Influenza-Unspecified 01/01/2020, 12/16/2020, 11/18/2021   PFIZER(Purple Top)SARS-COV-2 Vaccination 05/18/2019, 06/10/2019, 01/13/2020, 07/09/2020   Pfizer Covid-19 Vaccine Bivalent Booster 52yr & up 01/04/2021, 10/20/2021, 02/24/2022   Pneumococcal Conjugate-13 12/31/2017   Pneumococcal Polysaccharide-23 02/03/2019   Respiratory Syncytial Virus Vaccine,Recomb Aduvanted(Arexvy) 01/24/2022   Tdap 03/11/2020   Zoster Recombinat (Shingrix) 01/02/2018, 03/20/2018   Zoster, Live 03/12/2013     ROS: See pertinent positives and negatives per HPI.  Past Medical History:  Diagnosis Date   Basal cell carcinoma 12/08/2014   L spinal mid back    Basal cell carcinoma 12/08/2014   R spinal upper back    Basal cell carcinoma 12/08/2014   L upper sternum    Basal cell carcinoma 02/11/2018   L med pretibial     Basal cell carcinoma 02/11/2018   L lower pretibial    Basal cell carcinoma 09/17/2018   R shoulder    Basal cell carcinoma 09/17/2018   L lat pretibial    Basal cell carcinoma 02/18/2019   R parietal scalp    Basal cell carcinoma 02/28/2019   Central nasal tip    Basal cell carcinoma 09/23/2019   R mid chin    Basal cell carcinoma 09/23/2019   R clavicle    Basal cell carcinoma 10/14/2020   left superior medial forehead, Moh's 02/07/21   Basal cell carcinoma 10/14/2020   right medial cheek, Moh's 02/07/21   Basal cell carcinoma 11/08/2021   L nasal ala, Mohs 01/09/2022   BCC (basal cell carcinoma of skin) 10/14/2020   L preauricular, exc 12/27/20   Degenerative arthritis    hips, knees   GERD (gastroesophageal reflux disease)    Varicella zoster 01/2008    Past Surgical History:  Procedure Laterality Date   BASAL CELL CARCINOMA EXCISION     DILATION AND CURETTAGE OF UTERUS     TONSILECTOMY/ADENOIDECTOMY WITH MYRINGOTOMY       Current Outpatient Medications:    aspirin 81 MG EC tablet, Take 1 tablet by mouth daily., Disp: , Rfl:    benzonatate (TESSALON PERLES) 100 MG capsule, 1-2 capsules up to twice daily as needed for cough, Disp: 30 capsule, Rfl: 0   calcium-vitamin D (OSCAL-500) 500-400 MG-UNIT tablet, Take 1 tablet by mouth daily. , Disp: , Rfl:    LUMIGAN 0.01 % SOLN, SMARTSIG:In Eye(s), Disp: , Rfl:    Magnesium Oxide 400 (240 Mg) MG TABS, , Disp: , Rfl:    Multiple Vitamin (MULTIVITAMIN) tablet, Take 1 tablet by mouth daily., Disp: , Rfl:  nirmatrelvir/ritonavir EUA (PAXLOVID) 20 x 150 MG & 10 x '100MG'$  TABS, Take 3 tablets by mouth 2 (two) times daily for 5 days. (Take nirmatrelvir 150 mg two tablets twice daily for 5 days and ritonavir 100 mg one tablet twice daily for 5 days) Patient GFR is 70 last check 2 months ago, Disp: 30 tablet, Rfl: 0   omeprazole (PRILOSEC) 20 MG capsule, TAKE ONE CAPSULE BY MOUTH DAILY, Disp: 90 capsule, Rfl: 2   venlafaxine XR  (EFFEXOR-XR) 75 MG 24 hr capsule, TAKE ONE CAPSULE BY MOUTH DAILY, Disp: 90 capsule, Rfl: 1   levothyroxine (SYNTHROID) 50 MCG tablet, Take 50 mcg by mouth daily., Disp: , Rfl:   EXAM:  VITALS per patient if applicable: reports temp has been around 100  GENERAL: alert, oriented, appears well and in no acute distress  HEENT: atraumatic, conjunttiva clear, no obvious abnormalities on inspection of external nose and ears  NECK: normal movements of the head and neck  LUNGS: on inspection no signs of respiratory distress, breathing rate appears normal, no obvious gross SOB, gasping or wheezing  CV: no obvious cyanosis  MS: moves all visible extremities without noticeable abnormality  PSYCH/NEURO: pleasant and cooperative, no obvious depression or anxiety, speech and thought processing grossly intact  ASSESSMENT AND PLAN:  Discussed the following assessment and plan:  COVID-19   Discussed treatment options, side effect and risk of drug interactions, ideal treatment window, potential complications, isolation and precautions for COVID-19.  Checked for/reviewed last GFR - listed in HPI if available. After lengthy discussion, the patient opted for treatment with Paxlovid due to being higher risk for complications of covid or severe disease and other factors. Discussed EUA status of this drug and the fact that there is preliminary limited knowledge of risks/interactions/side effects per EUA document vs possible benefits and precautions. This information was shared with patient during the visit and also was provided in patient instructions. Also, advised that patient discuss risks/interactions and use with pharmacist/treatment team as well. The patient did want a prescription for cough, Tessalon Rx sent.  Other symptomatic care measures summarized in patient instructions. Advised to seek prompt virtual visit or in person care if worsening, new symptoms arise, or if is not improving with treatment as  expected per our conversation of expected course. Discussed options for follow up care. Did let this patient know that I do telemedicine on Tuesdays and Thursdays for [facility] and those are the days I am logged into the system. Advised to schedule follow up visit with PCP, [facility] virtual visits or [facility] if any further questions or concerns to avoid delays in care.   I discussed the assessment and treatment plan with the patient. The patient was provided an opportunity to ask questions and all were answered. The patient agreed with the plan and demonstrated an understanding of the instructions.     Lucretia Kern, DO

## 2022-03-09 NOTE — Patient Instructions (Signed)
HOME CARE TIPS:    -I sent the medication(s) we discussed to your pharmacy: Meds ordered this encounter  Medications   nirmatrelvir/ritonavir EUA (PAXLOVID) 20 x 150 MG & 10 x '100MG'$  TABS    Sig: Take 3 tablets by mouth 2 (two) times daily for 5 days. (Take nirmatrelvir 150 mg two tablets twice daily for 5 days and ritonavir 100 mg one tablet twice daily for 5 days) Patient GFR is 70 last check 2 months ago    Dispense:  30 tablet    Refill:  0   benzonatate (TESSALON PERLES) 100 MG capsule    Sig: 1-2 capsules up to twice daily as needed for cough    Dispense:  30 capsule    Refill:  0     -I sent in the [facility] treatment or referral you requested per our discussion. Please see the information provided below and discuss further with the pharmacist/treatment team.  -If taking Paxlovid, please review all medications, supplement and over the counter drugs with your pharmacist and ask them to check for any interactions. Please make the following changes to your regular medications while taking Paxlovid: *hold vitamins and supplement while taking this medication and ask pharmacist before using  -there is a chance of rebound illness with covid after improving. This can happen whether or not you take an antiviral treatment. If you become sick again with covid after getting better, please schedule a follow up virtual visit and isolate again.  -can use tylenol or aleve if needed for fevers, aches and pains per instructions  -nasal saline sinus rinses twice daily  -stay hydrated, drink plenty of fluids and eat small healthy meals - avoid dairy   -follow up with your doctor in 2-3 days unless improving and feeling better  -stay home while sick, except to seek medical care. If you have COVID19, you will likely be contagious for 7-10 days. Flu or Influenza is likely contagious for about 7 days. Other respiratory viral infections remain contagious for 5-10+ days depending on the virus and many  other factors. Wear a good mask that fits snugly (such as N95 or KN95) if around others to reduce the risk of transmission.  It was nice to meet you today, and I really hope you are feeling better soon. I help [facility] out with telemedicine visits on Tuesdays and Thursdays and am happy to help if you need a follow up virtual visit on those days. Otherwise, if you have any concerns or questions following this visit please schedule a follow up visit with your Primary Care doctor or seek care at a local urgent care clinic to avoid delays in care.    Seek in person care or schedule a follow up video visit promptly if your symptoms worsen, new concerns arise or you are not improving with treatment. Call 911 and/or seek emergency care if your symptoms are severe or life threatening.   See the following link for the most recent information regarding Paxlovid:  www.paxlovid.com   Nirmatrelvir; Ritonavir Tablets What is this medication? NIRMATRELVIR; RITONAVIR (NIR ma TREL vir; ri TOE na veer) treats mild to moderate COVID-19. It may help people who are at high risk of developing severe illness. This medication works by limiting the spread of the virus in your body. The FDA has allowed the emergency use of this medication. This medicine may be used for other purposes; ask your health care provider or pharmacist if you have questions. COMMON BRAND NAME(S): PAXLOVID What should I  tell my care team before I take this medication? They need to know if you have any of these conditions: Any allergies Any serious illness Kidney disease Liver disease An unusual or allergic reaction to nirmatrelvir, ritonavir, other medications, foods, dyes, or preservatives Pregnant or trying to get pregnant Breast-feeding How should I use this medication? This product contains 2 different medications that are packaged together. For the standard dose, take 2 pink tablets of nirmatrelvir with 1 white tablet of ritonavir  (3 tablets total) by mouth with water twice daily. Talk to your care team if you have kidney disease. You may need a different dose. Swallow the tablets whole. You can take it with or without food. If it upsets your stomach, take it with food. Take all of this medication unless your care team tells you to stop it early. Keep taking it even if you think you are better. Talk to your care team about the use of this medication in children. While it may be prescribed for children as young as 12 years for selected conditions, precautions do apply. Overdosage: If you think you have taken too much of this medicine contact a poison control center or emergency room at once. NOTE: This medicine is only for you. Do not share this medicine with others. What if I miss a dose? If you miss a dose, take it as soon as you can unless it is more than 8 hours late. If it is more than 8 hours late, skip the missed dose. Take the next dose at the normal time. Do not take extra or 2 doses at the same time to make up for the missed dose. What may interact with this medication? Do not take this medication with any of the following medications: Alfuzosin Certain medications for anxiety or sleep like midazolam, triazolam Certain medications for cancer like apalutamide, enzalutamide Certain medications for cholesterol like lovastatin, simvastatin Certain medications for irregular heart beat like amiodarone, dronedarone, flecainide, propafenone, quinidine Certain medications for pain like meperidine, piroxicam Certain medications for psychotic disorders like clozapine, lurasidone, pimozide Certain medications for seizures like carbamazepine, phenobarbital, phenytoin Colchicine Eletriptan Eplerenone Ergot alkaloids like dihydroergotamine, ergonovine, ergotamine, methylergonovine Finerenone Flibanserin Ivabradine Lomitapide Naloxegol Ranolazine Rifampin Sildenafil Silodosin [facility] John's  Wort Tolvaptan Ubrogepant Voclosporin This medication may also interact with the following medications: Bedaquiline Birth control pills Bosentan Certain antibiotics like erythromycin or clarithromycin Certain medications for blood pressure like amlodipine, diltiazem, felodipine, nicardipine, nifedipine Certain medications for cancer like abemaciclib, ceritinib, dasatinib, encorafenib, ibrutinib, ivosidenib, neratinib, nilotinib, venetoclax, vinblastine, vincristine Certain medications for cholesterol like atorvastatin, rosuvastatin Certain medications for depression like bupropion, trazodone Certain medications for fungal infections like isavuconazonium, itraconazole, ketoconazole, voriconazole Certain medications for hepatitis C like elbasvir; grazoprevir, dasabuvir; ombitasvir; paritaprevir; ritonavir, glecaprevir; pibrentasvir, sofosbuvir; velpatasvir; voxilaprevir Certain medications for HIV or AIDS Certain medications for irregular heartbeat like lidocaine Certain medications that treat or prevent blood clots like rivaroxaban, warfarin Digoxin Fentanyl Medications that lower your chance of fighting infection like cyclosporine, sirolimus, tacrolimus Methadone Quetiapine Rifabutin Salmeterol Steroid medications like betamethasone, budesonide, ciclesonide, dexamethasone, fluticasone, methylprednisone, mometasone, triamcinolone This list may not describe all possible interactions. Give your health care provider a list of all the medicines, herbs, non-prescription drugs, or dietary supplements you use. Also tell them if you smoke, drink alcohol, or use illegal drugs. Some items may interact with your medicine. What should I watch for while using this medication? Your condition will be monitored carefully while you are receiving this medication. Visit your care team  for regular checkups. Tell your care team if your symptoms do not start to get better or if they get worse. If you have  untreated HIV infection, this medication may lead to some HIV medications not working as well in the future. Birth control may not work properly while you are taking this medication. Talk to your care team about using an extra method of birth control. What side effects may I notice from receiving this medication? Side effects that you should report to your care team as soon as possible: Allergic reactions--skin rash, itching, hives, swelling of the face, lips, tongue, or throat Liver injury--right upper belly pain, loss of appetite, nausea, light-colored stool, dark yellow or brown urine, yellowing skin or eyes, unusual weakness or fatigue Redness, blistering, peeling, or loosening of the skin, including inside the mouth Side effects that usually do not require medical attention (report these to your care team if they continue or are bothersome): Change in taste Diarrhea General discomfort and fatigue Increase in blood pressure Muscle pain Nausea Stomach pain This list may not describe all possible side effects. Call your doctor for medical advice about side effects. You may report side effects to FDA at 1-800-FDA-1088. Where should I keep my medication? Keep out of the reach of children and pets. Store at room temperature between 20 and 25 degrees C (68 and 77 degrees F). Get rid of any unused medication after the expiration date. To get rid of medications that are no longer needed or have expired: Take the medication to a medication take-back program. Check with your pharmacy or law enforcement to find a location. If you cannot return the medication, check the label or package insert to see if the medication should be thrown out in the garbage or flushed down the toilet. If you are not sure, ask your care team. If it is safe to put it in the trash, take the medication out of the container. Mix the medication with cat litter, dirt, coffee grounds, or other unwanted substance. Seal the mixture  in a bag or container. Put it in the trash. NOTE: This sheet is a summary. It may not cover all possible information. If you have questions about this medicine, talk to your doctor, pharmacist, or health care provider.  2022 Elsevier/Gold Standard (2020-12-27 00:00:00)

## 2022-03-16 ENCOUNTER — Ambulatory Visit (INDEPENDENT_AMBULATORY_CARE_PROVIDER_SITE_OTHER): Payer: Medicare HMO

## 2022-03-16 VITALS — Ht 71.0 in | Wt 170.0 lb

## 2022-03-16 DIAGNOSIS — Z0000 Encounter for general adult medical examination without abnormal findings: Secondary | ICD-10-CM

## 2022-03-16 NOTE — Patient Instructions (Addendum)
[name] , Thank you for taking time to come for your Medicare Wellness Visit. I appreciate your ongoing commitment to your health goals. Please review the following plan we discussed and let me know if I can assist you in the future.   These are the goals we discussed:  Goals      Maintain Healthy Lifestyle     Lose weight (around 160lb) Stay active        This is a list of the screening recommended for you and due dates:  Health Maintenance  Topic Date Due   COVID-19 Vaccine (8 - 2023-24 season) 04/21/2022   Cologuard (Stool DNA test)  01/27/2023   Medicare Annual Wellness Visit  03/17/2023   Mammogram  10/27/2023   DTaP/Tdap/Td vaccine (2 - Td or Tdap) 03/11/2030   Pneumonia Vaccine  Completed   Flu Shot  Completed   DEXA scan (bone density measurement)  Completed   Hepatitis C Screening: USPSTF Recommendation to screen - Ages 57-79 yo.  Completed   Zoster (Shingles) Vaccine  Completed   HPV Vaccine  Aged Out   Colon Cancer Screening  Discontinued    Conditions/risks identified: none new  Next appointment: Follow up in one year for your annual wellness visit    Preventive Care 65 Years and Older, Female Preventive care refers to lifestyle choices and visits with your health care provider that can promote health and wellness. What does preventive care include? A yearly physical exam. This is also called an annual well check. Dental exams once or twice a year. Routine eye exams. Ask your health care provider how often you should have your eyes checked. Personal lifestyle choices, including: Daily care of your teeth and gums. Regular physical activity. Eating a healthy diet. Avoiding tobacco and drug use. Limiting alcohol use. Practicing safe sex. Taking low-dose aspirin every day. Taking vitamin and mineral supplements as recommended by your health care provider. What happens during an annual well check? The services and screenings done by your health care  provider during your annual well check will depend on your age, overall health, lifestyle risk factors, and family history of disease. Counseling  Your health care provider may ask you questions about your: Alcohol use. Tobacco use. Drug use. Emotional well-being. Home and relationship well-being. Sexual activity. Eating habits. History of falls. Memory and ability to understand (cognition). Work and work Statistician. Reproductive health. Screening  You may have the following tests or measurements: Height, weight, and BMI. Blood pressure. Lipid and cholesterol levels. These may be checked every 5 years, or more frequently if you are over 62 years old. Skin check. Lung cancer screening. You may have this screening every year starting at age 8 if you have a 30-pack-year history of smoking and currently smoke or have quit within the past 15 years. Fecal occult blood test (FOBT) of the stool. You may have this test every year starting at age 78. Flexible sigmoidoscopy or colonoscopy. You may have a sigmoidoscopy every 5 years or a colonoscopy every 10 years starting at age 62. Hepatitis C blood test. Hepatitis B blood test. Sexually transmitted disease (STD) testing. Diabetes screening. This is done by checking your blood sugar (glucose) after you have not eaten for a while (fasting). You may have this done every 1-3 years. Bone density scan. This is done to screen for osteoporosis. You may have this done starting at age 57. Mammogram. This may be done every 1-2 years. Talk to your health care provider about how  often you should have regular mammograms. Talk with your health care provider about your test results, treatment options, and if necessary, the need for more tests. Vaccines  Your health care provider may recommend certain vaccines, such as: Influenza vaccine. This is recommended every year. Tetanus, diphtheria, and acellular pertussis (Tdap, Td) vaccine. You may need a Td  booster every 10 years. Zoster vaccine. You may need this after age 39. Pneumococcal 13-valent conjugate (PCV13) vaccine. One dose is recommended after age 61. Pneumococcal polysaccharide (PPSV23) vaccine. One dose is recommended after age 50. Talk to your health care provider about which screenings and vaccines you need and how often you need them. This information is not intended to replace advice given to you by your health care provider. Make sure you discuss any questions you have with your health care provider. Document Released: 04/23/2015 Document Revised: 12/15/2015 Document Reviewed: 01/26/2015 Elsevier Interactive Patient Education  2017 [place] Prevention in the Home Falls can cause injuries. They can happen to people of all ages. There are many things you can do to make your home safe and to help prevent falls. What can I do on the outside of my home? Regularly fix the edges of walkways and driveways and fix any cracks. Remove anything that might make you trip as you walk through a door, such as a raised step or threshold. Trim any bushes or trees on the path to your home. Use bright outdoor lighting. Clear any walking paths of anything that might make someone trip, such as rocks or tools. Regularly check to see if handrails are loose or broken. Make sure that both sides of any steps have handrails. Any raised decks and porches should have guardrails on the edges. Have any leaves, snow, or ice cleared regularly. Use sand or salt on walking paths during winter. Clean up any spills in your garage right away. This includes oil or grease spills. What can I do in the bathroom? Use night lights. Install grab bars by the toilet and in the tub and shower. Do not use towel bars as grab bars. Use non-skid mats or decals in the tub or shower. If you need to sit down in the shower, use a plastic, non-slip stool. Keep the floor dry. Clean up any water that spills on the floor  as soon as it happens. Remove soap buildup in the tub or shower regularly. Attach bath mats securely with double-sided non-slip rug tape. Do not have throw rugs and other things on the floor that can make you trip. What can I do in the bedroom? Use night lights. Make sure that you have a light by your bed that is easy to reach. Do not use any sheets or blankets that are too big for your bed. They should not hang down onto the floor. Have a firm chair that has side arms. You can use this for support while you get dressed. Do not have throw rugs and other things on the floor that can make you trip. What can I do in the kitchen? Clean up any spills right away. Avoid walking on wet floors. Keep items that you use a lot in easy-to-reach places. If you need to reach something above you, use a strong step stool that has a grab bar. Keep electrical cords out of the way. Do not use floor polish or wax that makes floors slippery. If you must use wax, use non-skid floor wax. Do not have throw rugs and other things  on the floor that can make you trip. What can I do with my stairs? Do not leave any items on the stairs. Make sure that there are handrails on both sides of the stairs and use them. Fix handrails that are broken or loose. Make sure that handrails are as long as the stairways. Check any carpeting to make sure that it is firmly attached to the stairs. Fix any carpet that is loose or worn. Avoid having throw rugs at the top or bottom of the stairs. If you do have throw rugs, attach them to the floor with carpet tape. Make sure that you have a light switch at the top of the stairs and the bottom of the stairs. If you do not have them, ask someone to add them for you. What else can I do to help prevent falls? Wear shoes that: Do not have high heels. Have rubber bottoms. Are comfortable and fit you well. Are closed at the toe. Do not wear sandals. If you use a stepladder: Make sure that it is  fully opened. Do not climb a closed stepladder. Make sure that both sides of the stepladder are locked into place. Ask someone to hold it for you, if possible. Clearly mark and make sure that you can see: Any grab bars or handrails. First and last steps. Where the edge of each step is. Use tools that help you move around (mobility aids) if they are needed. These include: Canes. Walkers. Scooters. Crutches. Turn on the lights when you go into a dark area. Replace any light bulbs as soon as they burn out. Set up your furniture so you have a clear path. Avoid moving your furniture around. If any of your floors are uneven, fix them. If there are any pets around you, be aware of where they are. Review your medicines with your doctor. Some medicines can make you feel dizzy. This can increase your chance of falling. Ask your doctor what other things that you can do to help prevent falls. This information is not intended to replace advice given to you by your health care provider. Make sure you discuss any questions you have with your health care provider. Document Released: 01/21/2009 Document Revised: 09/02/2015 Document Reviewed: 05/01/2014 Elsevier Interactive Patient Education  2017 [employer].

## 2022-03-16 NOTE — Progress Notes (Signed)
Subjective:   Sarah Perry is a 71 y.o. female who presents for Medicare Annual (Subsequent) preventive examination.  Review of Systems    No ROS.  Medicare Wellness Virtual Visit.  Visual/audio telehealth visit, UTA vital signs.   See social history for additional risk factors.   Cardiac Risk Factors include: advanced age (>54mn, >>41women)     Objective:    Today's Vitals   03/16/22 1343  Weight: 170 lb (77.1 kg)  Height: '5\' 11"'$  (1.803 m)   Body mass index is 23.71 kg/m.     03/15/2021    9:07 AM 03/12/2020    9:10 AM 03/22/2019    2:57 PM 03/11/2019    9:11 AM 10/24/2018    1:38 PM  Advanced Directives  Does Patient Have a Medical Advance Directive? No No No No No  Would patient like information on creating a medical advance directive? No - Patient declined No - Patient declined  No - Patient declined No - Patient declined    Current Medications (verified) Outpatient Encounter Medications as of 03/16/2022  Medication Sig   aspirin 81 MG EC tablet Take 1 tablet by mouth daily.   benzonatate (TESSALON PERLES) 100 MG capsule 1-2 capsules up to twice daily as needed for cough   calcium-vitamin D (OSCAL-500) 500-400 MG-UNIT tablet Take 1 tablet by mouth daily.    levothyroxine (SYNTHROID) 50 MCG tablet Take 50 mcg by mouth daily.   LUMIGAN 0.01 % SOLN SMARTSIG:In Eye(s)   Magnesium Oxide 400 (240 Mg) MG TABS    Multiple Vitamin (MULTIVITAMIN) tablet Take 1 tablet by mouth daily.   omeprazole (PRILOSEC) 20 MG capsule TAKE ONE CAPSULE BY MOUTH DAILY   venlafaxine XR (EFFEXOR-XR) 75 MG 24 hr capsule TAKE ONE CAPSULE BY MOUTH DAILY   No facility-administered encounter medications on file as of 03/16/2022.    Allergies (verified) Penicillins   History: Past Medical History:  Diagnosis Date   Basal cell carcinoma 12/08/2014   L spinal mid back    Basal cell carcinoma 12/08/2014   R spinal upper back    Basal cell carcinoma 12/08/2014   L upper sternum    Basal  cell carcinoma 02/11/2018   L med pretibial    Basal cell carcinoma 02/11/2018   L lower pretibial    Basal cell carcinoma 09/17/2018   R shoulder    Basal cell carcinoma 09/17/2018   L lat pretibial    Basal cell carcinoma 02/18/2019   R parietal scalp    Basal cell carcinoma 02/28/2019   Central nasal tip    Basal cell carcinoma 09/23/2019   R mid chin    Basal cell carcinoma 09/23/2019   R clavicle    Basal cell carcinoma 10/14/2020   left superior medial forehead, Moh's 02/07/21   Basal cell carcinoma 10/14/2020   right medial cheek, Moh's 02/07/21   Basal cell carcinoma 11/08/2021   L nasal ala, Mohs 01/09/2022   BCC (basal cell carcinoma of skin) 10/14/2020   L preauricular, exc 12/27/20   Degenerative arthritis    hips, knees   GERD (gastroesophageal reflux disease)    Varicella zoster 01/2008   Past Surgical History:  Procedure Laterality Date   BASAL CELL CARCINOMA EXCISION     DILATION AND CURETTAGE OF UTERUS     TONSILECTOMY/ADENOIDECTOMY WITH MYRINGOTOMY     Family History  Problem Relation Age of Onset   Ovarian cancer Mother    Hypercholesterolemia Mother    Dementia Father  Prostate cancer Father    Breast cancer Paternal Aunt    Pancreatic cancer Maternal Aunt    Melanoma Sister    Social History   Socioeconomic History   Marital status: Married    Spouse name: Not on file   Number of children: 2   Years of education: Not on file   Highest education level: Not on file  Occupational History   Not on file  Tobacco Use   Smoking status: Never   Smokeless tobacco: Never  Vaping Use   Vaping Use: Never used  Substance and Sexual Activity   Alcohol use: Yes    Alcohol/week: 0.0 standard drinks of alcohol    Comment: glass of wine daily   Drug use: No   Sexual activity: Not on file  Other Topics Concern   Not on file  Social History Narrative   Not on file   Social Determinants of Health   Financial Resource Strain: Low Risk   (03/16/2022)   Overall Financial Resource Strain (CARDIA)    Difficulty of Paying Living Expenses: Not hard at all  Food Insecurity: No Food Insecurity (03/16/2022)   Hunger Vital Sign    Worried About Running Out of Food in the Last Year: Never true    Ran Out of Food in the Last Year: Never true  Transportation Needs: No Transportation Needs (03/16/2022)   PRAPARE - Hydrologist (Medical): No    Lack of Transportation (Non-Medical): No  Physical Activity: Sufficiently Active (03/16/2022)   Exercise Vital Sign    Days of Exercise per Week: 7 days    Minutes of Exercise per Session: 30 min  Stress: No Stress Concern Present (03/16/2022)   [place]    Feeling of Stress : Not at all  Social Connections: Unknown (03/16/2022)   Social Connection and Isolation Panel [NHANES]    Frequency of Communication with Friends and Family: Not on file    Frequency of Social Gatherings with Friends and Family: Not on file    Attends Religious Services: More than 4 times per year    Active Member of [employer] or Organizations: Not on file    Attends Music therapist: More than 4 times per year    Marital Status: Married    Tobacco Counseling Counseling given: Not Answered   Clinical Intake:  Pre-visit preparation completed: Yes        Diabetes: No  How often do you need to have someone help you when you read instructions, pamphlets, or other written materials from your doctor or pharmacy?: 1 - Never    Interpreter Needed?: No      Activities of Daily Living    03/16/2022    1:38 PM  In your present state of health, do you have any difficulty performing the following activities:  Hearing? 0  Vision? 0  Difficulty concentrating or making decisions? 0  Walking or climbing stairs? 0  Dressing or bathing? 0  Doing errands, shopping? 0  Preparing Food and eating ? N  Using the  Toilet? N  In the past six months, have you accidently leaked urine? N  Do you have problems with loss of bowel control? N  Managing your Medications? N  Managing your Finances? N  Housekeeping or managing your Housekeeping? N    Patient Care Team: Einar Pheasant, MD as PCP - General (Internal Medicine)  Indicate any recent Medical Services you may have  received from other than [facility] providers in the past year (date may be approximate).     Assessment:   This is a routine wellness examination for Jonette.  I connected with  Mickenzie Stolar Nease on 03/16/22 by a audio enabled telemedicine application and verified that I am speaking with the correct person using two identifiers.  Patient Location: Home  Provider Location: Office/Clinic  I discussed the limitations of evaluation and management by telemedicine. The patient expressed understanding and agreed to proceed.   Hearing/Vision screen Hearing Screening - Comments:: Patient is able to hear conversational tones without difficulty.  No issues reported.   Vision Screening - Comments:: Followed by [facility] Wears corrective lenses High pressure; drops in use They have seen their ophthalmologist in the last 6-12 months.    Dietary issues and exercise activities discussed: Current Exercise Habits: Home exercise routine, Type of exercise: walking, Time (Minutes): 20, Frequency (Times/Week): 7, Weekly Exercise (Minutes/Week): 140, Intensity: Moderate Regular diet Good water intake   Goals Addressed             This Visit's Progress    Maintain Healthy Lifestyle       Lose weight (around 160lb) Stay active       Depression Screen    03/16/2022    1:43 PM 03/09/2022    1:02 PM 12/20/2021    9:02 AM 03/15/2021    9:08 AM 03/12/2020    9:08 AM 03/11/2019    9:12 AM 09/12/2018   10:18 AM  PHQ 2/9 Scores  PHQ - 2 Score 0 0 0 0 0 0 0  PHQ- 9 Score 0 2         Fall Risk    03/16/2022    1:37 PM 03/09/2022    1:02  PM 12/20/2021    9:02 AM 03/15/2021    9:07 AM 03/12/2020    9:11 AM  [place] in the past year? 1 0 0 0 0  Number falls in past yr: 0 0 0 0 0  Injury with Fall?  0 0    Risk for fall due to : No Fall Risks No Fall Risks No Fall Risks    Follow up Falls evaluation completed Falls evaluation completed Falls evaluation completed Falls evaluation completed Falls evaluation completed    [place]: Home free of loose throw rugs in walkways, pet beds, electrical cords, etc? Yes  Adequate lighting in your home to reduce risk of falls? Yes   ASSISTIVE DEVICES UTILIZED TO PREVENT FALLS: Life alert? No  Use of a cane, walker or w/c? No   TIMED UP AND GO: Was the test performed? No .   Cognitive Function:        03/16/2022    2:07 PM 03/11/2019    9:14 AM  6CIT Screen  What Year? 0 points 0 points  What month? 0 points 0 points  What time? 0 points 0 points  Count back from 20 0 points 0 points  Months in reverse 0 points 0 points  Repeat phrase 0 points 0 points  Total Score 0 points 0 points    Immunizations Immunization History  Administered Date(s) Administered   Influenza Split 01/06/2013, 12/30/2013, 01/05/2015   Influenza, High Dose Seasonal PF 11/28/2018   Influenza,inj,Quad PF,6+ Mos 12/12/2017   Influenza-Unspecified 01/01/2020, 12/16/2020, 11/18/2021   PFIZER(Purple Top)SARS-COV-2 Vaccination 05/18/2019, 06/10/2019, 01/13/2020, 07/09/2020   Pfizer Covid-19 Vaccine Bivalent Booster 64yr & up 01/04/2021,  10/20/2021, 02/24/2022   Pneumococcal Conjugate-13 12/31/2017   Pneumococcal Polysaccharide-23 02/03/2019   Respiratory Syncytial Virus Vaccine,Recomb Aduvanted(Arexvy) 01/24/2022   Tdap 03/11/2020   Zoster Recombinat (Shingrix) 01/02/2018, 03/20/2018   Zoster, Live 03/12/2013   Screening Tests Health Maintenance  Topic Date Due   COVID-19 Vaccine (8 - 2023-24 season) 04/21/2022   Fecal DNA (Cologuard)  01/27/2023    Medicare Annual Wellness (AWV)  03/17/2023   MAMMOGRAM  10/27/2023   DTaP/Tdap/Td (2 - Td or Tdap) 03/11/2030   Pneumonia Vaccine 77+ Years old  Completed   INFLUENZA VACCINE  Completed   DEXA SCAN  Completed   Hepatitis C Screening  Completed   Zoster Vaccines- Shingrix  Completed   HPV VACCINES  Aged Out   COLONOSCOPY (Pts 45-75yr Insurance coverage will need to be confirmed)  Discontinued   Health Maintenance There are no preventive care reminders to display for this patient.   Lung Cancer Screening: (Low Dose CT Chest recommended if Age 69-80years, 30 pack-year currently smoking OR have quit w/in 15years.) does not qualify.   Hepatitis C Screening: Completed 2018.  Vision Screening: Recommended annual ophthalmology exams for early detection of glaucoma and other disorders of the eye.  Dental Screening: Recommended annual dental exams for proper oral hygiene.  Community Resource Referral / Chronic Care Management: CRR required this visit?  No   CCM required this visit?  No      Plan:     I have personally reviewed and noted the following in the patient's chart:   Medical and social history Use of alcohol, tobacco or illicit drugs  Current medications and supplements including opioid prescriptions. Patient is not currently taking opioid prescriptions. Functional ability and status Nutritional status Physical activity Advanced directives List of other physicians Hospitalizations, surgeries, and ER visits in previous 12 months Vitals Screenings to include cognitive, depression, and falls Referrals and appointments  In addition, I have reviewed and discussed with patient certain preventive protocols, quality metrics, and best practice recommendations. A written personalized care plan for preventive services as well as general preventive health recommendations were provided to patient.     DLeta Jungling LPN   [id number]

## 2022-03-28 ENCOUNTER — Other Ambulatory Visit: Payer: Self-pay | Admitting: Internal Medicine

## 2022-06-06 ENCOUNTER — Ambulatory Visit: Payer: Medicare HMO | Admitting: Dermatology

## 2022-06-06 ENCOUNTER — Encounter: Payer: Self-pay | Admitting: Dermatology

## 2022-06-06 VITALS — BP 125/81 | HR 71

## 2022-06-06 DIAGNOSIS — Z85828 Personal history of other malignant neoplasm of skin: Secondary | ICD-10-CM | POA: Diagnosis not present

## 2022-06-06 DIAGNOSIS — L578 Other skin changes due to chronic exposure to nonionizing radiation: Secondary | ICD-10-CM | POA: Diagnosis not present

## 2022-06-06 DIAGNOSIS — L570 Actinic keratosis: Secondary | ICD-10-CM | POA: Diagnosis not present

## 2022-06-06 DIAGNOSIS — C44719 Basal cell carcinoma of skin of left lower limb, including hip: Secondary | ICD-10-CM | POA: Diagnosis not present

## 2022-06-06 DIAGNOSIS — D489 Neoplasm of uncertain behavior, unspecified: Secondary | ICD-10-CM | POA: Diagnosis not present

## 2022-06-06 DIAGNOSIS — Z1283 Encounter for screening for malignant neoplasm of skin: Secondary | ICD-10-CM

## 2022-06-06 DIAGNOSIS — C44712 Basal cell carcinoma of skin of right lower limb, including hip: Secondary | ICD-10-CM

## 2022-06-06 DIAGNOSIS — L814 Other melanin hyperpigmentation: Secondary | ICD-10-CM | POA: Diagnosis not present

## 2022-06-06 DIAGNOSIS — D229 Melanocytic nevi, unspecified: Secondary | ICD-10-CM | POA: Diagnosis not present

## 2022-06-06 DIAGNOSIS — L821 Other seborrheic keratosis: Secondary | ICD-10-CM

## 2022-06-06 DIAGNOSIS — D1801 Hemangioma of skin and subcutaneous tissue: Secondary | ICD-10-CM

## 2022-06-06 DIAGNOSIS — L738 Other specified follicular disorders: Secondary | ICD-10-CM

## 2022-06-06 NOTE — Progress Notes (Unsigned)
Follow-Up Visit   Subjective  Sarah Perry is a 72 y.o. female who presents for the following: Annual Exam (6 month tbse hx of bcc , ).  The patient presents for Total-Body Skin Exam (TBSE) for skin cancer screening and mole check.  The patient has spots, moles and lesions to be evaluated, some may be new or changing and the patient has concerns that these could be cancer.  The following portions of the chart were reviewed this encounter and updated as appropriate:      Review of Systems: No other skin or systemic complaints except as noted in HPI or Assessment and Plan.   Objective  Well appearing patient in no apparent distress; mood and affect are within normal limits.  A full examination was performed including scalp, head, eyes, ears, nose, lips, neck, chest, axillae, abdomen, back, buttocks, bilateral upper extremities, bilateral lower extremities, hands, feet, fingers, toes, fingernails, and toenails. All findings within normal limits unless otherwise noted below.  right lateral thigh 4 mm speckled brown macule at right lateral thigh   sternal notch x 1 Erythematous thin papules/macules with gritty scale.   left popliteal 4 mm pink scaly papule        right posterior ankle 6 mm pink macule adjacent to scar, Pt states BCC was removed from same spot years ago        Assessment & Plan  Lentigo right lateral thigh     Vs SK   Benign-appearing.  Observation.  Call clinic for new or changing lesions.  Recommend daily use of broad spectrum spf 30+ sunscreen to sun-exposed areas.    Actinic keratosis sternal notch x 1  Actinic keratoses are precancerous spots that appear secondary to cumulative UV radiation exposure/sun exposure over time. They are chronic with expected duration over 1 year. A portion of actinic keratoses will progress to squamous cell carcinoma of the skin. It is not possible to reliably predict which spots will progress to skin cancer and  so treatment is recommended to prevent development of skin cancer.  Recommend daily broad spectrum sunscreen SPF 30+ to sun-exposed areas, reapply every 2 hours as needed.  Recommend staying in the shade or wearing long sleeves, sun glasses (UVA+UVB protection) and wide brim hats (4-inch brim around the entire circumference of the hat). Call for new or changing lesions.  Destruction of lesion - sternal notch x 1  Destruction method: cryotherapy   Informed consent: discussed and consent obtained   Lesion destroyed using liquid nitrogen: Yes   Region frozen until ice ball extended beyond lesion: Yes   Outcome: patient tolerated procedure well with no complications   Post-procedure details: wound care instructions given   Additional details:  Prior to procedure, discussed risks of blister formation, small wound, skin dyspigmentation, or rare scar following cryotherapy. Recommend Vaseline ointment to treated areas while healing.   Neoplasm of uncertain behavior (2) left popliteal  Epidermal / dermal shaving  Lesion diameter (cm):  0.4 Informed consent: discussed and consent obtained   Patient was prepped and draped in usual sterile fashion: Area prepped with alcohol. Anesthesia: the lesion was anesthetized in a standard fashion   Anesthetic:  1% lidocaine w/ epinephrine 1-100,000 buffered w/ 8.4% NaHCO3 Instrument used: flexible razor blade   Hemostasis achieved with: pressure, aluminum chloride and electrodesiccation   Outcome: patient tolerated procedure well    Destruction of lesion  Destruction method: electrodesiccation and curettage   Informed consent: discussed and consent obtained   Curettage performed  in three different directions: Yes   Electrodesiccation performed over the curetted area: Yes   Final wound size (cm):  0.7 Hemostasis achieved with:  pressure, aluminum chloride and electrodesiccation Outcome: patient tolerated procedure well with no complications    Post-procedure details: wound care instructions given   Additional details:  Mupirocin ointment and Bandaid applied   Specimen 1 - Surgical pathology Differential Diagnosis: Isk r/o BCC  Check Margins: No  right posterior ankle  Epidermal / dermal shaving  Lesion diameter (cm):  0.6 Informed consent: discussed and consent obtained   Patient was prepped and draped in usual sterile fashion: Area prepped with alcohol. Anesthesia: the lesion was anesthetized in a standard fashion   Anesthetic:  1% lidocaine w/ epinephrine 1-100,000 buffered w/ 8.4% NaHCO3 Instrument used: flexible razor blade   Hemostasis achieved with: pressure, aluminum chloride and electrodesiccation   Outcome: patient tolerated procedure well    Destruction of lesion  Destruction method: electrodesiccation and curettage   Informed consent: discussed and consent obtained   Curettage performed in three different directions: Yes   Electrodesiccation performed over the curetted area: Yes   Final wound size (cm):  0.8 Hemostasis achieved with:  pressure, aluminum chloride and electrodesiccation Outcome: patient tolerated procedure well with no complications   Post-procedure details: wound care instructions given   Additional details:  Mupirocin ointment and Bandaid applied   Specimen 2 - Surgical pathology Differential Diagnosis: Isk r/o BCC  Check Margins: No  Isk r/o bcc   Lentigines - Scattered tan macules - Due to sun exposure - Benign-appearing, observe - Recommend daily broad spectrum sunscreen SPF 30+ to sun-exposed areas, reapply every 2 hours as needed. - Call for any changes  Seborrheic Keratoses - Stuck-on, waxy, tan-brown papules and/or plaques  - Benign-appearing - Discussed benign etiology and prognosis. - Observe - Call for any changes  Melanocytic Nevi - Tan-brown and/or pink-flesh-colored symmetric macules and papules - Benign appearing on exam today - Observation - Call clinic  for new or changing moles - Recommend daily use of broad spectrum spf 30+ sunscreen to sun-exposed areas.   Hemangiomas - Red papules - Discussed benign nature - Observe - Call for any changes  Sebaceous Hyperplasia Forehead - Small yellow papules with a central dell - Benign - Observe  Actinic Damage - Chronic condition, secondary to cumulative UV/sun exposure - diffuse scaly erythematous macules with underlying dyspigmentation - Recommend daily broad spectrum sunscreen SPF 30+ to sun-exposed areas, reapply every 2 hours as needed.  - Staying in the shade or wearing long sleeves, sun glasses (UVA+UVB protection) and wide brim hats (4-inch brim around the entire circumference of the hat) are also recommended for sun protection.  - Call for new or changing lesions.  History of Basal Cell Carcinoma of the Skin Left nasal ala 2023 Mohs and multiple locations see history  - No evidence of recurrence today, including nose - Recommend regular full body skin exams - Recommend daily broad spectrum sunscreen SPF 30+ to sun-exposed areas, reapply every 2 hours as needed.  - Call if any new or changing lesions are noted between office visits  Skin cancer screening performed today. Return in about 6 months (around 12/05/2022) for recheck aks and hx of bcc.  I, Ruthell Rummage, CMA, am acting as scribe for Brendolyn Patty, MD.  Documentation: I have reviewed the above documentation for accuracy and completeness, and I agree with the above.  Brendolyn Patty MD

## 2022-06-06 NOTE — Patient Instructions (Addendum)
Electrodesiccation and Curettage ("Scrape and Burn") Wound Care Instructions  Leave the original bandage on for 24 hours if possible.  If the bandage becomes soaked or soiled before that time, it is OK to remove it and examine the wound.  A small amount of post-operative bleeding is normal.  If excessive bleeding occurs, remove the bandage, place gauze over the site and apply continuous pressure (no peeking) over the area for 30 minutes. If this does not work, please call our clinic as soon as possible or page your doctor if it is after hours.   Once a day, cleanse the wound with soap and water. It is fine to shower. If a thick crust develops you may use a Q-tip dipped into dilute hydrogen peroxide (mix 1:1 with water) to dissolve it.  Hydrogen peroxide can slow the healing process, so use it only as needed.    After washing, apply petroleum jelly (Vaseline) or an antibiotic ointment if your doctor prescribed one for you, followed by a bandage.    For best healing, the wound should be covered with a layer of ointment at all times. If you are not able to keep the area covered with a bandage to hold the ointment in place, this may mean re-applying the ointment several times a day.  Continue this wound care until the wound has healed and is no longer open. It may take several weeks for the wound to heal and close.  Itching and mild discomfort is normal during the healing process.  If you have any discomfort, you can take Tylenol (acetaminophen) or ibuprofen as directed on the bottle. (Please do not take these if you have an allergy to them or cannot take them for another reason).  Some redness, tenderness and white or yellow material in the wound is normal healing.  If the area becomes very sore and red, or develops a thick yellow-green material (pus), it may be infected; please notify us.    Wound healing continues for up to one year following surgery. It is not unusual to experience pain in the scar  from time to time during the interval.  If the pain becomes severe or the scar thickens, you should notify the office.    A slight amount of redness in a scar is expected for the first six months.  After six months, the redness will fade and the scar will soften and fade.  The color difference becomes less noticeable with time.  If there are any problems, return for a post-op surgery check at your earliest convenience.  To improve the appearance of the scar, you can use silicone scar gel, cream, or sheets (such as Mederma or Serica) every night for up to one year. These are available over the counter (without a prescription).  Please call our office at [phone] for any questions or concerns.  Biopsy Wound Care Instructions  Leave the original bandage on for 24 hours if possible.  If the bandage becomes soaked or soiled before that time, it is OK to remove it and examine the wound.  A small amount of post-operative bleeding is normal.  If excessive bleeding occurs, remove the bandage, place gauze over the site and apply continuous pressure (no peeking) over the area for 30 minutes. If this does not work, please call our clinic as soon as possible or page your doctor if it is after hours.   Once a day, cleanse the wound with soap and water. It is fine to shower. If  a thick crust develops you may use a Q-tip dipped into dilute hydrogen peroxide (mix 1:1 with water) to dissolve it.  Hydrogen peroxide can slow the healing process, so use it only as needed.    After washing, apply petroleum jelly (Vaseline) or an antibiotic ointment if your doctor prescribed one for you, followed by a bandage.    For best healing, the wound should be covered with a layer of ointment at all times. If you are not able to keep the area covered with a bandage to hold the ointment in place, this may mean re-applying the ointment several times a day.  Continue this wound care until the wound has healed and is no longer  open.   Itching and mild discomfort is normal during the healing process. However, if you develop pain or severe itching, please call our office.   If you have any discomfort, you can take Tylenol (acetaminophen) or ibuprofen as directed on the bottle. (Please do not take these if you have an allergy to them or cannot take them for another reason).  Some redness, tenderness and white or yellow material in the wound is normal healing.  If the area becomes very sore and red, or develops a thick yellow-green material (pus), it may be infected; please notify us.    If you have stitches, return to clinic as directed to have the stitches removed. You will continue wound care for 2-3 days after the stitches are removed.   Wound healing continues for up to one year following surgery. It is not unusual to experience pain in the scar from time to time during the interval.  If the pain becomes severe or the scar thickens, you should notify the office.    A slight amount of redness in a scar is expected for the first six months.  After six months, the redness will fade and the scar will soften and fade.  The color difference becomes less noticeable with time.  If there are any problems, return for a post-op surgery check at your earliest convenience.  To improve the appearance of the scar, you can use silicone scar gel, cream, or sheets (such as Mederma or Serica) every night for up to one year. These are available over the counter (without a prescription).  Please call our office at [phone] for any questions or concerns.               Actinic keratoses are precancerous spots that appear secondary to cumulative UV radiation exposure/sun exposure over time. They are chronic with expected duration over 1 year. A portion of actinic keratoses will progress to squamous cell carcinoma of the skin. It is not possible to reliably predict which spots will progress to skin cancer and so treatment is  recommended to prevent development of skin cancer.  Recommend daily broad spectrum sunscreen SPF 30+ to sun-exposed areas, reapply every 2 hours as needed.  Recommend staying in the shade or wearing long sleeves, sun glasses (UVA+UVB protection) and wide brim hats (4-inch brim around the entire circumference of the hat). Call for new or changing lesions.     Cryotherapy Aftercare  Wash gently with soap and water everyday.   Apply Vaseline and Band-Aid daily until healed.      Melanoma ABCDEs  Melanoma is the most dangerous type of skin cancer, and is the leading cause of death from skin disease.  You are more likely to develop melanoma if you: Have light-colored skin, light-colored eyes, or  red or blond hair Spend a lot of time in the sun Tan regularly, either outdoors or in a tanning bed Have had blistering sunburns, especially during childhood Have a close family member who has had a melanoma Have atypical moles or large birthmarks  Early detection of melanoma is key since treatment is typically straightforward and cure rates are extremely high if we catch it early.   The first sign of melanoma is often a change in a mole or a new dark spot.  The ABCDE system is a way of remembering the signs of melanoma.  A for asymmetry:  The two halves do not match. B for border:  The edges of the growth are irregular. C for color:  A mixture of colors are present instead of an even brown color. D for diameter:  Melanomas are usually (but not always) greater than 100m - the size of a pencil eraser. E for evolution:  The spot keeps changing in size, shape, and color.  Please check your skin once per month between visits. You can use a small mirror in front and a large mirror behind you to keep an eye on the back side or your body.   If you see any new or changing lesions before your next follow-up, please call to schedule a visit.  Please continue daily skin protection including broad  spectrum sunscreen SPF 30+ to sun-exposed areas, reapplying every 2 hours as needed when you're outdoors.   Staying in the shade or wearing long sleeves, sun glasses (UVA+UVB protection) and wide brim hats (4-inch brim around the entire circumference of the hat) are also recommended for sun protection.    Due to recent changes in healthcare laws, you may see results of your pathology and/or laboratory studies on MyChart before the doctors have had a chance to review them. We understand that in some cases there may be results that are confusing or concerning to you. Please understand that not all results are received at the same time and often the doctors may need to interpret multiple results in order to provide you with the best plan of care or course of treatment. Therefore, we ask that you please give u[place]2 business days to thoroughly review all your results before contacting the office for clarification. Should we see a critical lab result, you will be contacted sooner.   If You Need Anything After Your Visit  If you have any questions or concerns for your doctor, please call our main line at [phone] press option 4 to reach your doctor's medical assistant. If no one answers, please leave a voicemail as directed and we will return your call as soon as possible. Messages left after 4 pm will be answered the following business day.   You may also send [place] message via [place] We typically respond to MyChart messages within 1-2 business days.  For prescription refills, please ask your pharmacy to contact our office. Our fax number is [phone]  If you have an urgent issue when the clinic is closed that cannot wait until the next business day, you can page your doctor at the number below.    Please note that while we do our best to be available for urgent issues outside of office hours, we are not available 24/7.   If you have an urgent issue and are unable to reach u[place] you may choose  to seek medical care at your doctor's office, retail clinic, urgent care center, or emergency room.  If you have a medical emergency, please immediately call 911 or go to the emergency department.  Pager Numbers  - Dr. Nehemiah Massed: [phone]  - Dr. Laurence Ferrari: [phone]  - Dr. Nicole Kindred: [phone]  In the event of inclement weather, please call our main line at [phone] for an update on the status of any delays or closures.  Dermatology Medication Tips: Please keep the boxes that topical medications come in in order to help keep track of the instructions about where and how to use these. Pharmacies typically print the medication instructions only on the boxes and not directly on the medication tubes.   If your medication is too expensive, please contact our office at [phone] option 4 or send [place] a message through [place].   We are unable to tell what your co-pay for medications will be in advance as this is different depending on your insurance coverage. However, we may be able to find a substitute medication at lower cost or fill out paperwork to get insurance to cover a needed medication.   If a prior authorization is required to get your medication covered by your insurance company, please allow [place] 1-2 business days to complete this process.  Drug prices often vary depending on where the prescription is filled and some pharmacies may offer cheaper prices.  The website www.goodrx.com contains coupons for medications through different pharmacies. The prices here do not account for what the cost may be with help from insurance (it may be cheaper with your insurance), but the website can give you the price if you did not use any insurance.  - You can print the associated coupon and take it with your prescription to the pharmacy.  - You may also stop by our office during regular business hours and pick up a GoodRx coupon card.  - If you need your prescription sent electronically to a  different pharmacy, notify our office through [facility] or by phone at [phone] option 4.     Si Usted Necesita Algo Despus de Su Visita  Tambin puede enviarnos un mensaje a travs de Pharmacist, community. Por lo general respondemos a los mensajes de MyChart en el transcurso de 1 a 2 das hbiles.  Para renovar recetas, por favor pida a su farmacia que se ponga en contacto con nuestra oficina. Harland Dingwall de fax es [place] [phone].  Si tiene un asunto urgente cuando la clnica est cerrada y que no puede esperar hasta el siguiente da hbil, puede llamar/localizar a su doctor(a) al nmero que aparece a continuacin.   Por favor, tenga en cuenta que aunque hacemos todo lo posible para estar disponibles para asuntos urgentes fuera del horario de [place], no estamos disponibles las 24 horas del da, los 7 das de la [place].   Si tiene un problema urgente y no puede comunicarse con nosotros, puede optar por buscar atencin mdica  en el consultorio de su doctor(a), en una clnica privada, en un centro de atencin urgente o en una sala de emergencias.  Si tiene Engineering geologist, por favor llame inmediatamente al 911 o vaya a la sala de emergencias.  Nmeros de bper  - Dr. Nehemiah Massed: [phone]  - Dra. Moye: [phone]  - Dra. Nicole Kindred: [phone]  En caso de inclemencias del [place], por favor llame a Johnsie Kindred principal al [phone] para una actualizacin sobre el [place] de cualquier retraso o cierre.  Consejos para la medicacin en dermatologa: Por favor, guarde las cajas en las que vienen los medicamentos de [place]  tpico para ayudarle a seguir las instrucciones sobre dnde y cmo usarlos. Las farmacias generalmente imprimen las instrucciones del medicamento slo en las cajas y no directamente en los tubos del [place].   Si su medicamento es muy caro, por favor, pngase en contacto con Zigmund Daniel llamando al [phone] y presione la opcin 4 o envenos un  mensaje a travs de Pharmacist, community.   No podemos decirle cul ser su copago por los medicamentos por adelantado ya que esto es diferente dependiendo de la cobertura de su seguro. Sin embargo, es posible que podamos encontrar un medicamento sustituto a Electrical engineer un formulario para que el seguro cubra el medicamento que se considera necesario.   Si se requiere una autorizacin previa para que su compaa de seguros [place] su medicamento, por favor permtanos de 1 a 2 das hbiles para completar este proceso.  Los precios de los medicamentos varan con frecuencia dependiendo del Environmental consultant de dnde se surte la receta y alguna farmacias pueden ofrecer precios ms baratos.  El sitio web www.goodrx.com tiene cupones para medicamentos de Airline pilot. Los precios aqu no tienen en cuenta lo que podra costar con la ayuda del seguro (puede ser ms barato con su seguro), pero el sitio web puede darle el precio si no utiliz Research scientist (physical sciences).  - Puede imprimir el cupn correspondiente y llevarlo con su receta a la farmacia.  - Tambin puede pasar por nuestra oficina durante el horario de atencin regular y Charity fundraiser una tarjeta de cupones de GoodRx.  - Si necesita que su receta se enve electrnicamente a una farmacia diferente, informe a nuestra oficina a travs de MyChart de [place] o por telfono llamando al [phone] y presione la opcin 4.

## 2022-06-08 ENCOUNTER — Telehealth: Payer: Self-pay

## 2022-06-08 NOTE — Telephone Encounter (Signed)
-----   Message from Brendolyn Patty, MD sent at 06/07/2022  6:25 PM EST ----- 1. Skin , left popliteal BASAL CELL CARCINOMA, INFILTRATIVE PATTERN 2. Skin , right posterior ankle SUPERFICIAL BASAL CELL CARCINOMA  1 and 2. BCC skin cancer- already treated with EDC at time of biopsy    - please call patient

## 2022-06-08 NOTE — Telephone Encounter (Signed)
Advised pt of bx results/sh ?

## 2022-06-20 ENCOUNTER — Ambulatory Visit (INDEPENDENT_AMBULATORY_CARE_PROVIDER_SITE_OTHER): Payer: Medicare HMO | Admitting: Internal Medicine

## 2022-06-20 ENCOUNTER — Encounter: Payer: Self-pay | Admitting: Internal Medicine

## 2022-06-20 VITALS — BP 118/74 | HR 70 | Temp 97.9°F | Resp 16 | Ht 71.0 in | Wt 169.0 lb

## 2022-06-20 DIAGNOSIS — Z0000 Encounter for general adult medical examination without abnormal findings: Secondary | ICD-10-CM

## 2022-06-20 DIAGNOSIS — Z1231 Encounter for screening mammogram for malignant neoplasm of breast: Secondary | ICD-10-CM | POA: Diagnosis not present

## 2022-06-20 DIAGNOSIS — K219 Gastro-esophageal reflux disease without esophagitis: Secondary | ICD-10-CM | POA: Diagnosis not present

## 2022-06-20 DIAGNOSIS — Z13220 Encounter for screening for lipoid disorders: Secondary | ICD-10-CM | POA: Diagnosis not present

## 2022-06-20 DIAGNOSIS — D509 Iron deficiency anemia, unspecified: Secondary | ICD-10-CM

## 2022-06-20 DIAGNOSIS — D329 Benign neoplasm of meninges, unspecified: Secondary | ICD-10-CM | POA: Diagnosis not present

## 2022-06-20 DIAGNOSIS — E039 Hypothyroidism, unspecified: Secondary | ICD-10-CM | POA: Diagnosis not present

## 2022-06-20 DIAGNOSIS — Z1211 Encounter for screening for malignant neoplasm of colon: Secondary | ICD-10-CM | POA: Diagnosis not present

## 2022-06-20 DIAGNOSIS — R739 Hyperglycemia, unspecified: Secondary | ICD-10-CM

## 2022-06-20 LAB — LIPID PANEL
Cholesterol: 216 mg/dL — ABNORMAL HIGH (ref 0–200)
HDL: 93.5 mg/dL (ref >39.00)
LDL Cholesterol: 105 mg/dL — ABNORMAL HIGH (ref 0–99)
NonHDL: 122.85
Total CHOL/HDL Ratio: 2
Triglycerides: 91 mg/dL (ref 0.0–149.0)
VLDL: 18.2 mg/dL (ref 0.0–40.0)

## 2022-06-20 LAB — HEPATIC FUNCTION PANEL
ALT: 16 U/L (ref 0–35)
AST: 24 U/L (ref 0–37)
Albumin: 4.3 g/dL (ref 3.5–5.2)
Alkaline Phosphatase: 107 U/L (ref 39–117)
Bilirubin, Direct: 0.1 mg/dL (ref 0.0–0.3)
Total Bilirubin: 0.6 mg/dL (ref 0.2–1.2)
Total Protein: 6.8 g/dL (ref 6.0–8.3)

## 2022-06-20 LAB — BASIC METABOLIC PANEL
BUN: 15 mg/dL (ref 6–23)
CO2: 32 mEq/L (ref 19–32)
Calcium: 9.9 mg/dL (ref 8.4–10.5)
Chloride: 101 mEq/L (ref 96–112)
Creatinine, Ser: 0.74 mg/dL (ref 0.40–1.20)
GFR: 82.5 mL/min (ref >60.00)
Glucose, Bld: 101 mg/dL — ABNORMAL HIGH (ref 70–99)
Potassium: 4.7 mEq/L (ref 3.5–5.1)
Sodium: 140 mEq/L (ref 135–145)

## 2022-06-20 LAB — HEMOGLOBIN A1C: Hgb A1c MFr Bld: 5.9 % (ref 4.6–6.5)

## 2022-06-20 MED ORDER — VENLAFAXINE HCL ER 75 MG PO CP24: 75.0000 mg | ORAL_CAPSULE | Freq: Every day | ORAL | 1 refills | Status: DC

## 2022-06-20 MED ORDER — OMEPRAZOLE 20 MG PO CPDR: 20.0000 mg | DELAYED_RELEASE_CAPSULE | Freq: Every day | ORAL | 3 refills | Status: DC

## 2022-06-20 NOTE — Progress Notes (Signed)
FRAX assessment on 10/26/2021 using the Maple Ridge (analysis version: 14.10) manufactured by [employer]. The following summarizes the results of our evaluation. PATIENT BIOGRAPHICAL: Name: Nyiesha, Beever Patient ID: [id number] Birth Date: [birth year] Height:    71.0 in. Gender:     Female    [age]       Weight:    169.9 lbs. Ethnicity:  White                             Exam Date: 10/26/2021 FRAX* RESULTS:  (version: 3.5) 10-year Probability of Fracture1 Major Osteoporotic Fracture2 Hip Fracture 8.7% 0.9% Population: [place] (Caucasian) Risk Factors: None Based on Femur (Left) Neck BMD 1 -The 10-year probability of fracture may be lower than reported if the patient has received treatment. 2 -Major Osteoporotic Fracture: Clinical Spine, Forearm, Hip or Shoulder *FRAX is a Materials engineer of the [employer] for Metabolic Bone Disease, a [place] (WHO) [facility]. ASSESSMENT: The probability of a major osteoporotic fracture is 8.7% within the next ten years. The probability of a hip fracture is 0.9% within the next ten years. . Your patient Layani Foronda completed a BMD test on 10/26/2021 using the Kaplan (software version: 14.10) manufactured by [employer]. The following summarizes the results of our evaluation. Technologist: [facility] PATIENT BIOGRAPHICAL: Name: Marcella, Charlson Patient ID: [id number] Birth Date: [birth year] Height: 71.0 in. Gender: Female Exam Date: 10/26/2021 Weight: 169.9 lbs. Indications: Caucasian, Postmenopausal Fractures: Treatments: Calcium, Vitamin D DENSITOMETRY RESULTS: Site         Region     Measured Date Measured Age WHO Classification Young Adult T-score BMD         %Change vs. Previous Significant Change (*) DualFemur Neck Left 10/26/2021 68.8 Osteopenia -1.1 0.883 g/cm2 -3.7% - DualFemur Neck Left 10/29/2008 55.8 Normal -0.9 0.917 g/cm2 1.4% - DualFemur Neck Left 08/17/2006 53.6 Normal -1.0 0.904 g/cm2 - - DualFemur Total Mean 10/26/2021 68.8 Normal -0.1 0.994 g/cm2 -2.8% Yes DualFemur Total Mean 10/29/2008 55.8 Normal 0.1 1.023 g/cm2 -0.4% - DualFemur Total Mean 08/17/2006 53.6 Normal 0.2 1.027 g/cm2 - - Left Forearm Radius 33% 10/26/2021 68.8 Osteopenia -2.0 0.705 g/cm2 -15.6% Yes Left Forearm Radius 33% 10/29/2008 55.8 Normal -0.5 0.835 g/cm2 -4.4% Yes  Left Forearm Radius 33% 08/17/2006 53.6 Normal 0.0 0.873 g/cm2 - - ASSESSMENT: The BMD measured at Forearm Radius 33% is 0.705 g/cm2 with a T-score of -2.0. This patient is considered osteopenic according to [facility]) criteria. The scan quality is good. Lumbar spine was not utilized due to advanced degenerative changes. Compared with prior study, there has been significant decrease in the total hip. World Pharmacologist [facility]) criteria for post-menopausal, Caucasian Women: Normal:                   T-score at or above -1 SD Osteopenia/low bone mass: T-score between -1 and -2.5 SD Osteoporosis:             T-score at or below -2.5 SD RECOMMENDATIONS: 1. All patients should optimize calcium and vitamin D intake. 2. Consider FDA-approved medical therapies in postmenopausal women and men aged 15 years and older, based on the following: a. A hip or vertebral(clinical or morphometric) fracture b. T-score < -2.5 at the femoral neck or spine after appropriate evaluation to exclude secondary causes c. Low bone mass (  FRAX assessment on 10/26/2021 using the Maple Ridge (analysis version: 14.10) manufactured by [employer]. The following summarizes the results of our evaluation. PATIENT BIOGRAPHICAL: Name: Nyiesha, Beever Patient ID: [id number] Birth Date: [birth year] Height:    71.0 in. Gender:     Female    [age]        72.8       Weight:    169.9 lbs. Ethnicity:  White                             Exam Date: 10/26/2021 FRAX* RESULTS:  (version: 3.5) 10-year Probability of Fracture1 Major Osteoporotic Fracture2 Hip Fracture 8.7% 0.9% Population: [place] (Caucasian) Risk Factors: None Based on Femur (Left) Neck BMD 1 -The 10-year probability of fracture may be lower than reported if the patient has received treatment. 2 -Major Osteoporotic Fracture: Clinical Spine, Forearm, Hip or Shoulder *FRAX is a Materials engineer of the [employer] for Metabolic Bone Disease, a [place] (WHO) [facility]. ASSESSMENT: The probability of a major osteoporotic fracture is 8.7% within the next ten years. The probability of a hip fracture is 0.9% within the next ten years. . Your patient Layani Foronda completed a BMD test on 10/26/2021 using the Kaplan (software version: 14.10) manufactured by [employer]. The following summarizes the results of our evaluation. Technologist: [facility] PATIENT BIOGRAPHICAL: Name: Marcella, Charlson Patient ID: [id number] Birth Date: [birth year] Height: 71.0 in. Gender: Female Exam Date: 10/26/2021 Weight: 169.9 lbs. Indications: Caucasian, Postmenopausal Fractures: Treatments: Calcium, Vitamin D DENSITOMETRY RESULTS: Site         Region     Measured Date Measured Age WHO Classification Young Adult T-score BMD         %Change vs. Previous Significant Change (*) DualFemur Neck Left 10/26/2021 68.8 Osteopenia -1.1 0.883 g/cm2 -3.7% - DualFemur Neck Left 10/29/2008 55.8 Normal -0.9 0.917 g/cm2 1.4% - DualFemur Neck Left 08/17/2006 53.6 Normal -1.0 0.904 g/cm2 - - DualFemur Total Mean 10/26/2021 68.8 Normal -0.1 0.994 g/cm2 -2.8% Yes DualFemur Total Mean 10/29/2008 55.8 Normal 0.1 1.023 g/cm2 -0.4% - DualFemur Total Mean 08/17/2006 53.6 Normal 0.2 1.027 g/cm2 - - Left Forearm Radius 33% 10/26/2021 68.8 Osteopenia -2.0 0.705 g/cm2 -15.6% Yes Left Forearm Radius 33% 10/29/2008 55.8 Normal -0.5 0.835 g/cm2 -4.4% Yes  Left Forearm Radius 33% 08/17/2006 53.6 Normal 0.0 0.873 g/cm2 - - ASSESSMENT: The BMD measured at Forearm Radius 33% is 0.705 g/cm2 with a T-score of -2.0. This patient is considered osteopenic according to [facility]) criteria. The scan quality is good. Lumbar spine was not utilized due to advanced degenerative changes. Compared with prior study, there has been significant decrease in the total hip. World Pharmacologist [facility]) criteria for post-menopausal, Caucasian Women: Normal:                   T-score at or above -1 SD Osteopenia/low bone mass: T-score between -1 and -2.5 SD Osteoporosis:             T-score at or below -2.5 SD RECOMMENDATIONS: 1. All patients should optimize calcium and vitamin D intake. 2. Consider FDA-approved medical therapies in postmenopausal women and men aged 15 years and older, based on the following: a. A hip or vertebral(clinical or morphometric) fracture b. T-score < -2.5 at the femoral neck or spine after appropriate evaluation to exclude secondary causes c. Low bone mass (  FRAX assessment on 10/26/2021 using the Maple Ridge (analysis version: 14.10) manufactured by [employer]. The following summarizes the results of our evaluation. PATIENT BIOGRAPHICAL: Name: Nyiesha, Beever Patient ID: [id number] Birth Date: [birth year] Height:    71.0 in. Gender:     Female    [age]        72.8       Weight:    169.9 lbs. Ethnicity:  White                             Exam Date: 10/26/2021 FRAX* RESULTS:  (version: 3.5) 10-year Probability of Fracture1 Major Osteoporotic Fracture2 Hip Fracture 8.7% 0.9% Population: [place] (Caucasian) Risk Factors: None Based on Femur (Left) Neck BMD 1 -The 10-year probability of fracture may be lower than reported if the patient has received treatment. 2 -Major Osteoporotic Fracture: Clinical Spine, Forearm, Hip or Shoulder *FRAX is a Materials engineer of the [employer] for Metabolic Bone Disease, a [place] (WHO) [facility]. ASSESSMENT: The probability of a major osteoporotic fracture is 8.7% within the next ten years. The probability of a hip fracture is 0.9% within the next ten years. . Your patient Layani Foronda completed a BMD test on 10/26/2021 using the Kaplan (software version: 14.10) manufactured by [employer]. The following summarizes the results of our evaluation. Technologist: [facility] PATIENT BIOGRAPHICAL: Name: Marcella, Charlson Patient ID: [id number] Birth Date: [birth year] Height: 71.0 in. Gender: Female Exam Date: 10/26/2021 Weight: 169.9 lbs. Indications: Caucasian, Postmenopausal Fractures: Treatments: Calcium, Vitamin D DENSITOMETRY RESULTS: Site         Region     Measured Date Measured Age WHO Classification Young Adult T-score BMD         %Change vs. Previous Significant Change (*) DualFemur Neck Left 10/26/2021 68.8 Osteopenia -1.1 0.883 g/cm2 -3.7% - DualFemur Neck Left 10/29/2008 55.8 Normal -0.9 0.917 g/cm2 1.4% - DualFemur Neck Left 08/17/2006 53.6 Normal -1.0 0.904 g/cm2 - - DualFemur Total Mean 10/26/2021 68.8 Normal -0.1 0.994 g/cm2 -2.8% Yes DualFemur Total Mean 10/29/2008 55.8 Normal 0.1 1.023 g/cm2 -0.4% - DualFemur Total Mean 08/17/2006 53.6 Normal 0.2 1.027 g/cm2 - - Left Forearm Radius 33% 10/26/2021 68.8 Osteopenia -2.0 0.705 g/cm2 -15.6% Yes Left Forearm Radius 33% 10/29/2008 55.8 Normal -0.5 0.835 g/cm2 -4.4% Yes  Left Forearm Radius 33% 08/17/2006 53.6 Normal 0.0 0.873 g/cm2 - - ASSESSMENT: The BMD measured at Forearm Radius 33% is 0.705 g/cm2 with a T-score of -2.0. This patient is considered osteopenic according to [facility]) criteria. The scan quality is good. Lumbar spine was not utilized due to advanced degenerative changes. Compared with prior study, there has been significant decrease in the total hip. World Pharmacologist [facility]) criteria for post-menopausal, Caucasian Women: Normal:                   T-score at or above -1 SD Osteopenia/low bone mass: T-score between -1 and -2.5 SD Osteoporosis:             T-score at or below -2.5 SD RECOMMENDATIONS: 1. All patients should optimize calcium and vitamin D intake. 2. Consider FDA-approved medical therapies in postmenopausal women and men aged 15 years and older, based on the following: a. A hip or vertebral(clinical or morphometric) fracture b. T-score < -2.5 at the femoral neck or spine after appropriate evaluation to exclude secondary causes c. Low bone mass (  Subjective:    Patient ID: CHEVY VIRGO, female    DOB: 06/14/1950, 72 y.o.   MRN: [id number]  Patient here for  Chief Complaint  Patient presents with   Annual Exam    HPI Here for physical exam.  Had covid 02/2022.  Treated with paxlovid.  Symptoms resolved.  Had cough - two weeks ago.  Essentially back to baseline.  She is having some intermittent acid reflux.  On omeprazole.  Normally controls.  Some break through recently.  Seeing dermatology - basal cell removed. Continues f/u with dermatology.  Had f/u with Dr Adriana Simas 06/07/21 - meningioma. MRI - no interval growth. Recommended f/u MRI in 18 months.  Saw Dr Tedd Sias - thyroid - 01/2022.  Labs ok.  Recommended f/u in 12 months.  No chest pain or sob.  No abdominal pain or bowel change.     Past Medical History:  Diagnosis Date   Basal cell carcinoma 12/08/2014   L spinal mid back    Basal cell carcinoma 12/08/2014   R spinal upper back    Basal cell carcinoma 12/08/2014   L upper sternum    Basal cell carcinoma 02/11/2018   L med pretibial    Basal cell carcinoma 02/11/2018   L lower pretibial    Basal cell carcinoma 09/17/2018   R shoulder    Basal cell carcinoma 09/17/2018   L lat pretibial    Basal cell carcinoma 02/18/2019   R parietal scalp    Basal cell carcinoma 02/28/2019   Central nasal tip    Basal cell carcinoma 09/23/2019   R mid chin    Basal cell carcinoma 09/23/2019   R clavicle    Basal cell carcinoma 10/14/2020   left superior medial forehead, Moh's 02/07/21   Basal cell carcinoma 10/14/2020   right medial cheek, Moh's 02/07/21   Basal cell carcinoma 11/08/2021   L nasal ala, Mohs 01/09/2022   BCC (basal cell carcinoma of skin) 10/14/2020   L preauricular, exc 12/27/20   BCC (basal cell carcinoma of skin) 06/06/2022   left popliteal, edc   BCC (basal cell carcinoma of skin) 06/06/2022   left popliteal, edc   Degenerative arthritis    hips, knees   GERD (gastroesophageal reflux disease)     Varicella zoster 01/2008   Past Surgical History:  Procedure Laterality Date   BASAL CELL CARCINOMA EXCISION     DILATION AND CURETTAGE OF UTERUS     TONSILECTOMY/ADENOIDECTOMY WITH MYRINGOTOMY     Family History  Problem Relation Age of Onset   Ovarian cancer Mother    Hypercholesterolemia Mother    Dementia Father    Prostate cancer Father    Breast cancer Paternal Aunt    Pancreatic cancer Maternal Aunt    Melanoma Sister    Social History   Socioeconomic History   Marital status: Married    Spouse name: Not on file   Number of children: 2   Years of education: Not on file   Highest education level: Not on file  Occupational History   Not on file  Tobacco Use   Smoking status: Never   Smokeless tobacco: Never  Vaping Use   Vaping Use: Never used  Substance and Sexual Activity   Alcohol use: Yes    Alcohol/week: 0.0 standard drinks of alcohol    Comment: glass of wine daily   Drug use: No   Sexual activity: Not on file  Other Topics Concern   Not on file  Subjective:    Patient ID: CHEVY VIRGO, female    DOB: 06/14/1950, 72 y.o.   MRN: [id number]  Patient here for  Chief Complaint  Patient presents with   Annual Exam    HPI Here for physical exam.  Had covid 02/2022.  Treated with paxlovid.  Symptoms resolved.  Had cough - two weeks ago.  Essentially back to baseline.  She is having some intermittent acid reflux.  On omeprazole.  Normally controls.  Some break through recently.  Seeing dermatology - basal cell removed. Continues f/u with dermatology.  Had f/u with Dr Adriana Simas 06/07/21 - meningioma. MRI - no interval growth. Recommended f/u MRI in 18 months.  Saw Dr Tedd Sias - thyroid - 01/2022.  Labs ok.  Recommended f/u in 12 months.  No chest pain or sob.  No abdominal pain or bowel change.     Past Medical History:  Diagnosis Date   Basal cell carcinoma 12/08/2014   L spinal mid back    Basal cell carcinoma 12/08/2014   R spinal upper back    Basal cell carcinoma 12/08/2014   L upper sternum    Basal cell carcinoma 02/11/2018   L med pretibial    Basal cell carcinoma 02/11/2018   L lower pretibial    Basal cell carcinoma 09/17/2018   R shoulder    Basal cell carcinoma 09/17/2018   L lat pretibial    Basal cell carcinoma 02/18/2019   R parietal scalp    Basal cell carcinoma 02/28/2019   Central nasal tip    Basal cell carcinoma 09/23/2019   R mid chin    Basal cell carcinoma 09/23/2019   R clavicle    Basal cell carcinoma 10/14/2020   left superior medial forehead, Moh's 02/07/21   Basal cell carcinoma 10/14/2020   right medial cheek, Moh's 02/07/21   Basal cell carcinoma 11/08/2021   L nasal ala, Mohs 01/09/2022   BCC (basal cell carcinoma of skin) 10/14/2020   L preauricular, exc 12/27/20   BCC (basal cell carcinoma of skin) 06/06/2022   left popliteal, edc   BCC (basal cell carcinoma of skin) 06/06/2022   left popliteal, edc   Degenerative arthritis    hips, knees   GERD (gastroesophageal reflux disease)     Varicella zoster 01/2008   Past Surgical History:  Procedure Laterality Date   BASAL CELL CARCINOMA EXCISION     DILATION AND CURETTAGE OF UTERUS     TONSILECTOMY/ADENOIDECTOMY WITH MYRINGOTOMY     Family History  Problem Relation Age of Onset   Ovarian cancer Mother    Hypercholesterolemia Mother    Dementia Father    Prostate cancer Father    Breast cancer Paternal Aunt    Pancreatic cancer Maternal Aunt    Melanoma Sister    Social History   Socioeconomic History   Marital status: Married    Spouse name: Not on file   Number of children: 2   Years of education: Not on file   Highest education level: Not on file  Occupational History   Not on file  Tobacco Use   Smoking status: Never   Smokeless tobacco: Never  Vaping Use   Vaping Use: Never used  Substance and Sexual Activity   Alcohol use: Yes    Alcohol/week: 0.0 standard drinks of alcohol    Comment: glass of wine daily   Drug use: No   Sexual activity: Not on file  Other Topics Concern   Not on file

## 2022-06-20 NOTE — Assessment & Plan Note (Signed)
Physical today 06/20/22.   PAP 06/2017 - negative with negative HPV.  Mammogram 10/26/21 - Birads I. cologuard 01/2020 negative.

## 2022-06-25 ENCOUNTER — Encounter: Payer: Self-pay | Admitting: Internal Medicine

## 2022-06-25 NOTE — Assessment & Plan Note (Signed)
Had f/u Dr Cook 06/07/21 - meningioma.  MrI - no interval growth.  Recommended MRI in 18 months.  ?

## 2022-06-25 NOTE — Assessment & Plan Note (Signed)
Intermittent acid reflux as outlined.  On omeprazole.  Continue.  Take scheduled.  Discussed pepcid.  Follow.  Call with update.

## 2022-06-25 NOTE — Assessment & Plan Note (Signed)
On synthroid.  Follow tsh.   

## 2022-06-25 NOTE — Assessment & Plan Note (Signed)
Low carb diet and exercise.  Follow met b and a1c.   

## 2022-06-25 NOTE — Assessment & Plan Note (Signed)
Follow cbc.  

## 2022-06-29 DIAGNOSIS — C44311 Basal cell carcinoma of skin of nose: Secondary | ICD-10-CM | POA: Diagnosis not present

## 2022-08-10 DIAGNOSIS — H2511 Age-related nuclear cataract, right eye: Secondary | ICD-10-CM | POA: Diagnosis not present

## 2022-08-10 DIAGNOSIS — H43813 Vitreous degeneration, bilateral: Secondary | ICD-10-CM | POA: Diagnosis not present

## 2022-08-10 DIAGNOSIS — H2512 Age-related nuclear cataract, left eye: Secondary | ICD-10-CM | POA: Diagnosis not present

## 2022-08-10 DIAGNOSIS — H40053 Ocular hypertension, bilateral: Secondary | ICD-10-CM | POA: Diagnosis not present

## 2022-10-09 ENCOUNTER — Encounter: Payer: Self-pay | Admitting: Internal Medicine

## 2022-10-17 ENCOUNTER — Other Ambulatory Visit: Payer: Self-pay | Admitting: Neurosurgery

## 2022-10-17 DIAGNOSIS — D329 Benign neoplasm of meninges, unspecified: Secondary | ICD-10-CM

## 2022-10-30 ENCOUNTER — Ambulatory Visit
Admission: RE | Admit: 2022-10-30 | Discharge: 2022-10-30 | Disposition: A | Discharge status: Home / Self Care | Payer: Medicare HMO | Source: Ambulatory Visit | Attending: Internal Medicine | Admitting: Internal Medicine

## 2022-10-30 DIAGNOSIS — Z1231 Encounter for screening mammogram for malignant neoplasm of breast: Secondary | ICD-10-CM | POA: Insufficient documentation

## 2022-10-30 DIAGNOSIS — Z0100 Encounter for examination of eyes and vision without abnormal findings: Secondary | ICD-10-CM | POA: Diagnosis not present

## 2022-10-31 ENCOUNTER — Other Ambulatory Visit: Payer: Self-pay | Admitting: Internal Medicine

## 2022-10-31 DIAGNOSIS — R921 Mammographic calcification found on diagnostic imaging of breast: Secondary | ICD-10-CM

## 2022-10-31 DIAGNOSIS — R928 Other abnormal and inconclusive findings on diagnostic imaging of breast: Secondary | ICD-10-CM

## 2022-11-01 NOTE — Telephone Encounter (Signed)
-----   Message from Alanreed sent at 11/01/2022 12:28 AM EDT ----- Notify - I reviewed her mammogram report and radiology is recommending f/u mammogram and possible ultrasound of the right breast.  Order is signed and someone should be contacting her with an appt date and time.  Let us know if not scheduled.

## 2022-11-01 NOTE — Telephone Encounter (Signed)
Noted  

## 2022-11-01 NOTE — Telephone Encounter (Signed)
Patient called and note was read to patient. 

## 2022-11-10 ENCOUNTER — Encounter: Payer: Self-pay | Admitting: Internal Medicine

## 2022-11-10 ENCOUNTER — Ambulatory Visit
Admission: RE | Admit: 2022-11-10 | Discharge: 2022-11-10 | Disposition: A | Discharge status: Home / Self Care | Payer: Medicare HMO | Source: Ambulatory Visit | Attending: Internal Medicine | Admitting: Internal Medicine

## 2022-11-10 DIAGNOSIS — R92333 Mammographic heterogeneous density, bilateral breasts: Secondary | ICD-10-CM | POA: Diagnosis not present

## 2022-11-10 DIAGNOSIS — R928 Other abnormal and inconclusive findings on diagnostic imaging of breast: Secondary | ICD-10-CM | POA: Diagnosis not present

## 2022-11-10 DIAGNOSIS — R921 Mammographic calcification found on diagnostic imaging of breast: Secondary | ICD-10-CM | POA: Insufficient documentation

## 2022-11-13 ENCOUNTER — Other Ambulatory Visit: Payer: Self-pay | Admitting: Internal Medicine

## 2022-11-13 DIAGNOSIS — R928 Other abnormal and inconclusive findings on diagnostic imaging of breast: Secondary | ICD-10-CM

## 2022-11-13 DIAGNOSIS — R921 Mammographic calcification found on diagnostic imaging of breast: Secondary | ICD-10-CM

## 2022-11-13 NOTE — Telephone Encounter (Signed)
Called and spoke to Ms Bollier.  Discussed recommendation for biopsy.  Will clarify with radiology mammogram reading

## 2022-11-13 NOTE — Telephone Encounter (Signed)
Looks like radiology is recommending a biopsy.

## 2022-11-17 DIAGNOSIS — R69 Illness, unspecified: Secondary | ICD-10-CM | POA: Diagnosis not present

## 2022-11-18 ENCOUNTER — Encounter: Payer: Self-pay | Admitting: Internal Medicine

## 2022-11-23 ENCOUNTER — Encounter (INDEPENDENT_AMBULATORY_CARE_PROVIDER_SITE_OTHER): Payer: Self-pay

## 2022-11-29 ENCOUNTER — Ambulatory Visit
Admission: RE | Admit: 2022-11-29 | Discharge: 2022-11-29 | Disposition: A | Discharge status: Home / Self Care | Payer: Medicare HMO | Source: Ambulatory Visit | Attending: Internal Medicine | Admitting: Internal Medicine

## 2022-11-29 DIAGNOSIS — D0511 Intraductal carcinoma in situ of right breast: Secondary | ICD-10-CM | POA: Diagnosis not present

## 2022-11-29 DIAGNOSIS — R928 Other abnormal and inconclusive findings on diagnostic imaging of breast: Secondary | ICD-10-CM | POA: Insufficient documentation

## 2022-11-29 DIAGNOSIS — R921 Mammographic calcification found on diagnostic imaging of breast: Secondary | ICD-10-CM | POA: Insufficient documentation

## 2022-11-29 HISTORY — PX: BREAST BIOPSY: SHX20

## 2022-11-29 MED ORDER — LIDOCAINE-EPINEPHRINE 1 %-1:100000 IJ SOLN
20.0000 mL | Freq: Once | INTRAMUSCULAR | Status: AC
  Administered 2022-11-29: 20 mL

## 2022-11-29 MED ORDER — LIDOCAINE 1 % OPTIME INJ - NO CHARGE
5.0000 mL | Freq: Once | INTRAMUSCULAR | Status: AC
  Administered 2022-11-29: 5 mL

## 2022-12-01 ENCOUNTER — Encounter: Payer: Self-pay | Admitting: *Deleted

## 2022-12-01 DIAGNOSIS — D0511 Intraductal carcinoma in situ of right breast: Secondary | ICD-10-CM

## 2022-12-01 NOTE — Progress Notes (Unsigned)
Ms. Glasby decided she would like to see either Dr. Carolynne Edouard or Dr. Dwain Sarna at [facility].   Referral sent to the office.   She will see Dr. Orlie Dakin on Tuesday 8/27 at 3:00.

## 2022-12-01 NOTE — Progress Notes (Signed)
Received referral for newly diagnosed breast cancer from [facility].  Navigation initiated.  She is going to look up the providers and call me back with who she would like to see.

## 2022-12-05 ENCOUNTER — Encounter: Payer: Self-pay | Admitting: *Deleted

## 2022-12-05 ENCOUNTER — Inpatient Hospital Stay: Payer: Medicare HMO | Attending: Oncology | Admitting: Oncology

## 2022-12-05 ENCOUNTER — Encounter: Payer: Self-pay | Admitting: Oncology

## 2022-12-05 ENCOUNTER — Inpatient Hospital Stay: Payer: Medicare HMO

## 2022-12-05 VITALS — BP 120/85 | HR 72 | Temp 96.4°F | Resp 20 | Wt 171.8 lb

## 2022-12-05 DIAGNOSIS — Z808 Family history of malignant neoplasm of other organs or systems: Secondary | ICD-10-CM | POA: Diagnosis not present

## 2022-12-05 DIAGNOSIS — Z85828 Personal history of other malignant neoplasm of skin: Secondary | ICD-10-CM

## 2022-12-05 DIAGNOSIS — D0511 Intraductal carcinoma in situ of right breast: Secondary | ICD-10-CM

## 2022-12-05 DIAGNOSIS — Z800 Family history of malignant neoplasm of digestive organs: Secondary | ICD-10-CM

## 2022-12-05 DIAGNOSIS — Z803 Family history of malignant neoplasm of breast: Secondary | ICD-10-CM

## 2022-12-05 NOTE — Progress Notes (Signed)
[facility]  Telephone:([phone]) [phone] Fax:(336) [phone]  ID: HARMONEY MCLELLAN OB: 10-18-1950  MR#: [id number]  AOZ#:[id number]  Patient Care Team: Dale Seligman, MD as PCP - General (Internal Medicine) Hulen Luster, RN as Oncology Nurse Navigator  CHIEF COMPLAINT: DCIS right breast.  INTERVAL HISTORY: Patient is a 72 year old female who was noted to have an abnormality on routine yearly screening mammogram.  Subsequent ultrasound biopsy revealed noninvasive DCIS.  She currently feels well and is asymptomatic.  She has no neurologic complaints.  She denies any recent fevers or illnesses.  She has good appetite and denies weight loss.  She has no chest pain, shortness of breath, cough, or hemoptysis.  She denies any nausea, vomiting, constipation, or diarrhea.  She has no urinary complaints.  Patient feels at her baseline offers no further specific complaints today.  REVIEW OF SYSTEMS:   Review of Systems  Constitutional: Negative.  Negative for fever, malaise/fatigue and weight loss.  Respiratory: Negative.  Negative for cough, hemoptysis and shortness of breath.   Cardiovascular: Negative.  Negative for chest pain and leg swelling.  Gastrointestinal: Negative.  Negative for abdominal pain.  Genitourinary: Negative.  Negative for dysuria.  Musculoskeletal: Negative.  Negative for back pain.  Neurological: Negative.  Negative for dizziness, focal weakness, weakness and headaches.  Psychiatric/Behavioral: Negative.  The patient is not nervous/anxious.     As per HPI. Otherwise, a complete review of systems is negative.  PAST MEDICAL HISTORY: Past Medical History:  Diagnosis Date   Basal cell carcinoma 12/08/2014   L spinal mid back    Basal cell carcinoma 12/08/2014   R spinal upper back    Basal cell carcinoma 12/08/2014   L upper sternum    Basal cell carcinoma 02/11/2018   L med pretibial    Basal cell carcinoma 02/11/2018   L lower pretibial    Basal cell  carcinoma 09/17/2018   R shoulder    Basal cell carcinoma 09/17/2018   L lat pretibial    Basal cell carcinoma 02/18/2019   R parietal scalp    Basal cell carcinoma 02/28/2019   Central nasal tip    Basal cell carcinoma 09/23/2019   R mid chin    Basal cell carcinoma 09/23/2019   R clavicle    Basal cell carcinoma 10/14/2020   left superior medial forehead, Moh's 02/07/21   Basal cell carcinoma 10/14/2020   right medial cheek, Moh's 02/07/21   Basal cell carcinoma 11/08/2021   L nasal ala, Mohs 01/09/2022   BCC (basal cell carcinoma of skin) 10/14/2020   L preauricular, exc 12/27/20   BCC (basal cell carcinoma of skin) 06/06/2022   left popliteal, edc   BCC (basal cell carcinoma of skin) 06/06/2022   left popliteal, edc   Degenerative arthritis    hips, knees   GERD (gastroesophageal reflux disease)    Varicella zoster 01/2008    PAST SURGICAL HISTORY: Past Surgical History:  Procedure Laterality Date   BASAL CELL CARCINOMA EXCISION     BREAST BIOPSY Right 11/29/2022   Right Stereo Bx, X clip- path pending   BREAST BIOPSY Right 11/29/2022   MM RT BREAST BX W LOC DEV 1ST LESION IMAGE BX SPEC STEREO GUIDE 11/29/2022 [facility]   DILATION AND CURETTAGE OF UTERUS     TONSILECTOMY/ADENOIDECTOMY WITH MYRINGOTOMY      FAMILY HISTORY: Family History  Problem Relation Age of Onset   Ovarian cancer Mother    Hypercholesterolemia Mother    Dementia Father  Prostate cancer Father    Melanoma Sister    Breast cancer Sister 31   Pancreatic cancer Maternal Aunt    Breast cancer Paternal Aunt     ADVANCED DIRECTIVES (Y/N):  N  HEALTH MAINTENANCE: Social History   Tobacco Use   Smoking status: Never   Smokeless tobacco: Never  Vaping Use   Vaping status: Never Used  Substance Use Topics   Alcohol use: Yes    Alcohol/week: 0.0 standard drinks of alcohol    Comment: glass of wine daily   Drug use: No     Colonoscopy:  PAP:  Bone density:  Lipid  panel:  Allergies  Allergen Reactions   Penicillins Other (See Comments)    Current Outpatient Medications  Medication Sig Dispense Refill   aspirin 81 MG EC tablet Take 1 tablet by mouth daily.     benzonatate (TESSALON PERLES) 100 MG capsule 1-2 capsules up to twice daily as needed for cough 30 capsule 0   calcium-vitamin D (OSCAL-500) 500-400 MG-UNIT tablet Take 1 tablet by mouth daily.      levothyroxine (SYNTHROID) 50 MCG tablet Take 50 mcg by mouth daily.     LUMIGAN 0.01 % SOLN SMARTSIG:In Eye(s)     Magnesium Oxide 400 (240 Mg) MG TABS      Multiple Vitamin (MULTIVITAMIN) tablet Take 1 tablet by mouth daily.     omeprazole (PRILOSEC) 20 MG capsule Take 1 capsule (20 mg total) by mouth daily. 90 capsule 3   venlafaxine XR (EFFEXOR-XR) 75 MG 24 hr capsule Take 1 capsule (75 mg total) by mouth daily. 90 capsule 1   No current facility-administered medications for this visit.    OBJECTIVE: Vitals:   12/05/22 1513  BP: 120/85  Pulse: 72  Resp: 20  Temp: (!) 96.4 F (35.8 C)  SpO2: 100%     Body mass index is 23.96 kg/m.    ECOG FS:0 - Asymptomatic  General: Well-developed, well-nourished, no acute distress. Eyes: Pink conjunctiva, anicteric sclera. HEENT: Normocephalic, moist mucous membranes. Breasts: Exam deferred today. Lungs: No audible wheezing or coughing. Heart: Regular rate and rhythm. Abdomen: Soft, nontender, no obvious distention. Musculoskeletal: No edema, cyanosis, or clubbing. Neuro: Alert, answering all questions appropriately. Cranial nerves grossly intact. Skin: No rashes or petechiae noted. Psych: Normal affect. Lymphatics: No cervical, calvicular, axillary or inguinal LAD.   LAB RESULTS:  Lab Results  Component Value Date   NA 140 06/20/2022   K 4.7 06/20/2022   CL 101 06/20/2022   CO2 32 06/20/2022   GLUCOSE 101 (H) 06/20/2022   BUN 15 06/20/2022   CREATININE 0.74 06/20/2022   CALCIUM 9.9 06/20/2022   PROT 6.8 06/20/2022   ALBUMIN  4.3 06/20/2022   AST 24 06/20/2022   ALT 16 06/20/2022   ALKPHOS 107 06/20/2022   BILITOT 0.6 06/20/2022   GFRNONAA >60 03/22/2019   GFRAA >60 03/22/2019    Lab Results  Component Value Date   WBC 7.5 12/20/2021   NEUTROABS 5.4 12/20/2021   HGB 12.7 12/20/2021   HCT 37.4 12/20/2021   MCV 91.8 12/20/2021   PLT 249.0 12/20/2021     STUDIES: MM RT BREAST BX W LOC DEV 1ST LESION IMAGE BX SPEC STEREO GUIDE  Addendum Date: 11/30/2022   ADDENDUM REPORT: 11/30/2022 12:43 ADDENDUM: PATHOLOGY revealed: A. Breast, right, needle core biopsy, outer anterior depth, X clip, stereo - HIGH-GRADE DUCTAL CARCINOMA IN SITU (DCIS), COMEDO TYPE WITH CALCIFICATION. Pathology results are CONCORDANT with imaging findings, per Dr. Meda Klinefelter.  Pathology results and recommendations were discussed with patient via telephone on 11/30/2022. Patient reported biopsy site doing well with no adverse symptoms, and only slight tenderness at the site. Post biopsy care instructions were reviewed, questions were answered and my direct phone number was provided. Patient was instructed to call [facility] for any additional questions or concerns related to biopsy site. RECOMMENDATIONS: 1. Surgical and oncological consultation. Request for surgical and oncological consultation relayed to Irving Shows RN at [facility] by Randa Lynn RN on 11/30/2022. 2. Consider pretreatment bilateral breast MRI with and without contrast to determine extent of breast disease given diagnosis of high grade DCIS. 3. Recommend complete surgical excision of any residual calcifications. Pathology results reported by Randa Lynn RN on 11/30/2022. Electronically Signed   By: Meda Klinefelter M.D.   On: 11/30/2022 12:43   Result Date: 11/30/2022 CLINICAL DATA:  Indeterminate calcifications. EXAM: RIGHT BREAST STEREOTACTIC CORE NEEDLE BIOPSY COMPARISON:  Previous exam(s). FINDINGS: The patient and I discussed the procedure of  stereotactic-guided biopsy including benefits and alternatives. We discussed the high likelihood of a successful procedure. We discussed the risks of the procedure including infection, bleeding, tissue injury, clip migration, and inadequate sampling. Informed written consent was given. The usual time out protocol was performed immediately prior to the procedure. Using sterile technique and 1% lidocaine and 1% lidocaine with epinephrine as local anesthetic, under stereotactic guidance, a 9 gauge vacuum assisted device was used to perform core needle biopsy of calcifications in the lower outer quadrant of the RIGHT breast using a lateral approach. Specimen radiograph was performed showing representative calcifications in 3 of 7 samples. Specimens with calcifications are identified for pathology. Lesion quadrant: Lower outer quadrant At the conclusion of the procedure, an X shaped tissue marker clip was deployed into the biopsy cavity. Follow-up 2-view mammogram was performed and dictated separately. IMPRESSION: Stereotactic-guided biopsy of indeterminate calcifications. No apparent complications. Electronically Signed: By: Meda Klinefelter M.D. On: 11/29/2022 08:40   MM CLIP PLACEMENT RIGHT  Result Date: 11/29/2022 CLINICAL DATA:  Indeterminate calcifications EXAM: 3D DIAGNOSTIC RIGHT MAMMOGRAM POST STEREOTACTIC BIOPSY COMPARISON:  Previous exam(s). FINDINGS: 3D Mammographic images were obtained following stereotactic guided biopsy of indeterminate calcifications. The X shaped biopsy marking clip is in expected position at the site of biopsy. IMPRESSION: Appropriate positioning of the X shaped biopsy marking clip at the site of biopsy in the outer breast. Final Assessment: Post Procedure Mammograms for Marker Placement Electronically Signed   By: Meda Klinefelter M.D.   On: 11/29/2022 08:41   MM 3D DIAGNOSTIC MAMMOGRAM UNILATERAL RIGHT BREAST  Result Date: 11/10/2022 CLINICAL DATA:  Patient returns after  screening study for evaluation of RIGHT breast calcifications. EXAM: DIGITAL DIAGNOSTIC UNILATERAL RIGHT MAMMOGRAM WITH TOMOSYNTHESIS AND CAD TECHNIQUE: Right digital diagnostic mammography and breast tomosynthesis was performed. The images were evaluated with computer-aided detection. COMPARISON:  Previous exam(s). ACR Breast Density Category c: The breasts are heterogeneously dense, which may obscure small masses. FINDINGS: Magnified views are performed of calcifications in the LOWER OUTER QUADRANT of the RIGHT breast. These views demonstrate a group of coarse linear calcifications spanning 1.9 x 1.2 centimeters. IMPRESSION: Indeterminate RIGHT breast calcifications. RECOMMENDATION: Stereotactic biopsy of the RIGHT breast I have discussed the findings and recommendations with the patient. If applicable, a reminder letter will be sent to the patient regarding the next appointment. BI-RADS CATEGORY  4: Suspicious. Electronically Signed   By: Norva Pavlov M.D.   On: 11/10/2022 15:22    ASSESSMENT: DCIS  right breast, ER/PR pending.  PLAN:    DCIS right breast: Patient has an appointment with surgery later this week to discuss lumpectomy.  No invasive component was noted on her biopsy, therefore adjuvant chemotherapy will unlikely be needed.  After her surgery, she will benefit from adjuvant XRT and will arrange follow-up with radiation oncology postoperatively.  She also will likely benefit from tamoxifen for a total of 5 years after completion of XRT, but will need to await ER/PR testing from surgical specimen.  Patient reports after her surgical evaluation she is going to [place] and will likely have surgery upon her return.  Return to clinic 2 weeks after her lumpectomy to discuss her final pathology result and treatment planning.  I spent a total of 60 minutes reviewing chart data, face-to-face evaluation with the patient, counseling and coordination of care as detailed above.   Patient expressed  understanding and was in agreement with this plan. She also understands that She can call clinic at any time with any questions, concerns, or complaints.    Cancer Staging  Ductal carcinoma in situ (DCIS) of right breast Staging form: Breast, AJCC 8th Edition - Clinical stage from 12/05/2022: Stage 0 (cTis (DCIS), cN0, cM0) - Signed by Jeralyn Ruths, MD on 12/05/2022 Stage prefix: Initial diagnosis   Jeralyn Ruths, MD   12/05/2022 3:51 PM

## 2022-12-05 NOTE — Progress Notes (Signed)
Accompanied patient and family to initial medical oncology appointment.   Reviewed Breast Cancer treatment handbook.   Care plan summary given to patient.   Reviewed outreach programs and cancer center services.   

## 2022-12-07 ENCOUNTER — Other Ambulatory Visit: Payer: Self-pay | Admitting: General Surgery

## 2022-12-07 DIAGNOSIS — D0511 Intraductal carcinoma in situ of right breast: Secondary | ICD-10-CM | POA: Diagnosis not present

## 2022-12-08 ENCOUNTER — Encounter: Payer: Self-pay | Admitting: *Deleted

## 2022-12-08 DIAGNOSIS — D0511 Intraductal carcinoma in situ of right breast: Secondary | ICD-10-CM

## 2022-12-08 NOTE — Progress Notes (Signed)
Lumpectomy scheduled for 9/26.  She will see Dr. Orlie Dakin and Dr. Rushie Chestnut on 01/18/23.   Appt. Details left on VM.

## 2022-12-18 ENCOUNTER — Encounter: Payer: Self-pay | Admitting: Internal Medicine

## 2022-12-21 ENCOUNTER — Telehealth (INDEPENDENT_AMBULATORY_CARE_PROVIDER_SITE_OTHER): Payer: Medicare HMO | Admitting: Internal Medicine

## 2022-12-21 DIAGNOSIS — D0511 Intraductal carcinoma in situ of right breast: Secondary | ICD-10-CM | POA: Diagnosis not present

## 2022-12-21 DIAGNOSIS — K219 Gastro-esophageal reflux disease without esophagitis: Secondary | ICD-10-CM | POA: Diagnosis not present

## 2022-12-21 DIAGNOSIS — R739 Hyperglycemia, unspecified: Secondary | ICD-10-CM

## 2022-12-21 DIAGNOSIS — D509 Iron deficiency anemia, unspecified: Secondary | ICD-10-CM

## 2022-12-21 DIAGNOSIS — E039 Hypothyroidism, unspecified: Secondary | ICD-10-CM

## 2022-12-21 DIAGNOSIS — D329 Benign neoplasm of meninges, unspecified: Secondary | ICD-10-CM | POA: Diagnosis not present

## 2022-12-21 DIAGNOSIS — U071 COVID-19: Secondary | ICD-10-CM | POA: Diagnosis not present

## 2022-12-21 MED ORDER — VENLAFAXINE HCL ER 75 MG PO CP24: 75.0000 mg | ORAL_CAPSULE | Freq: Every day | ORAL | 1 refills | Status: DC

## 2022-12-21 NOTE — Progress Notes (Signed)
Patient ID: Sarah Perry, female   DOB: May 25, 1950, 72 y.o.   MRN: [id number]   Virtual Visit via video Note  I connected with Sarah Perry by a video enabled telemedicine application and verified that I am speaking with the correct person using two identifiers. Location patient: home Location provider: work  Persons participating in the virtual visit: patient, provider  The limitations, risks, security and privacy concerns of performing an evaluation and management service by video and the availability of in person appointments have been discussed.  It has also been discussed with the patient that there may be a patient responsible charge related to this service. The patient expressed understanding and agreed to proceed.   Reason for visit: follow up appt.   HPI: Recently traveled to [place] and returned home 12/17/22. Tested positive for covid 12/18/22. Unsure exactly when symptoms started.  Was biking and exposed to increased dust - 12/16/22.  Some headache and symptoms then, but she related to the dust and riding. On return home, reported increased cough.  Some nasal congestion. Took dayquil. No chest pain or sob reported.  No vomiting or diarrhea. Feels better. Recent abnormal mammogram.  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  Has seen Dr Sarah Perry.  Has surgery planned 01/04/23.  Had questions regarding large hematoma - from biopsy. Plans to call and discuss with surgery.  Discussed surgery.  Has dermatology f/u next week.  NSU/MRI follow up scheduled.    ROS: See pertinent positives and negatives per HPI.  Past Medical History:  Diagnosis Date   Basal cell carcinoma 12/08/2014   L spinal mid back    Basal cell carcinoma 12/08/2014   R spinal upper back    Basal cell carcinoma 12/08/2014   L upper sternum    Basal cell carcinoma 02/11/2018   L med pretibial    Basal cell carcinoma 02/11/2018   L lower pretibial    Basal cell carcinoma 09/17/2018   R shoulder    Basal  cell carcinoma 09/17/2018   L lat pretibial    Basal cell carcinoma 02/18/2019   R parietal scalp    Basal cell carcinoma 02/28/2019   Central nasal tip    Basal cell carcinoma 09/23/2019   R mid chin    Basal cell carcinoma 09/23/2019   R clavicle    Basal cell carcinoma 10/14/2020   left superior medial forehead, Moh's 02/07/21   Basal cell carcinoma 10/14/2020   right medial cheek, Moh's 02/07/21   Basal cell carcinoma 11/08/2021   L nasal ala, Mohs 01/09/2022   BCC (basal cell carcinoma of skin) 10/14/2020   L preauricular, exc 12/27/20   BCC (basal cell carcinoma of skin) 06/06/2022   left popliteal, edc   BCC (basal cell carcinoma of skin) 06/06/2022   left popliteal, edc   Degenerative arthritis    hips, knees   GERD (gastroesophageal reflux disease)    Varicella zoster 01/2008    Past Surgical History:  Procedure Laterality Date   BASAL CELL CARCINOMA EXCISION     BREAST BIOPSY Right 11/29/2022   Right Stereo Bx, X clip- path pending   BREAST BIOPSY Right 11/29/2022   MM RT BREAST BX W LOC DEV 1ST LESION IMAGE BX SPEC STEREO GUIDE 11/29/2022 [facility]   DILATION AND CURETTAGE OF UTERUS     TONSILECTOMY/ADENOIDECTOMY WITH MYRINGOTOMY      Family History  Problem Relation Age of Onset   Ovarian cancer Mother    Hypercholesterolemia Mother  Dementia Father    Prostate cancer Father    Melanoma Sister    Breast cancer Sister 80   Pancreatic cancer Maternal Aunt    Breast cancer Paternal Aunt     SOCIAL HX: reviewed.    Current Outpatient Medications:    aspirin 81 MG EC tablet, Take 1 tablet by mouth daily., Disp: , Rfl:    benzonatate (TESSALON PERLES) 100 MG capsule, 1-2 capsules up to twice daily as needed for cough, Disp: 30 capsule, Rfl: 0   calcium-vitamin D (OSCAL-500) 500-400 MG-UNIT tablet, Take 1 tablet by mouth daily. , Disp: , Rfl:    levothyroxine (SYNTHROID) 50 MCG tablet, Take 50 mcg by mouth daily., Disp: , Rfl:    LUMIGAN 0.01 %  SOLN, SMARTSIG:In Eye(s), Disp: , Rfl:    Magnesium Oxide 400 (240 Mg) MG TABS, , Disp: , Rfl:    Multiple Vitamin (MULTIVITAMIN) tablet, Take 1 tablet by mouth daily., Disp: , Rfl:    omeprazole (PRILOSEC) 20 MG capsule, Take 1 capsule (20 mg total) by mouth daily., Disp: 90 capsule, Rfl: 3   venlafaxine XR (EFFEXOR-XR) 75 MG 24 hr capsule, Take 1 capsule (75 mg total) by mouth daily., Disp: 90 capsule, Rfl: 1  EXAM:  GENERAL: alert, oriented, appears well and in no acute distress  HEENT: atraumatic, conjunttiva clear, no obvious abnormalities on inspection of external nose and ears  NECK: normal movements of the head and neck  LUNGS: on inspection no signs of respiratory distress, breathing rate appears normal, no obvious gross SOB, gasping or wheezing  CV: no obvious cyanosis  PSYCH/NEURO: pleasant and cooperative, no obvious depression or anxiety, speech and thought processing grossly intact  Breast:  large hematoma.   ASSESSMENT AND PLAN:  Discussed the following assessment and plan:  Problem List Items Addressed This Visit     Meningioma [facility]) - Primary    Had f/u Dr Sarah Perry 06/07/21 - meningioma.  MrI - no interval growth.  Recommended MRI in 18 months. Has f/u scheduled.       Hypothyroid    On synthroid.  Follow tsh.       Hyperglycemia    Low carb diet and exercise.  Follow met b and a1c.       GERD (gastroesophageal reflux disease)    On omeprazole.  Continue.         Ductal carcinoma in situ (DCIS) of right breast    Recently diagnosed.  Saw Dr Sarah Perry 12/07/22 - planning surgery - 01/04/23. Discussed breast hematoma.  Plans to discuss with surgery.        COVID-19 virus infection    Cough and congestion as outlined.  Feeling better.  Discussed covid.  Discussed oral antiviral medication.  Unsure when exactly symptoms started, but appears more than 5 days.  Given already improving and timing, hold on antiviral medication.  No chest pain or sob.  Robitussin DM.   Saline nasal spray and steroid nasal spray as directed.  Follow.  Call with update.  Discussed quarantine precautions.       Anemia    Follow cbc.        Return in about 8 weeks (around 02/15/2023) for physical.   I discussed the assessment and treatment plan with the patient. The patient was provided an opportunity to ask questions and all were answered. The patient agreed with the plan and demonstrated an understanding of the instructions.   The patient was advised to call back or seek an in-person evaluation  if the symptoms worsen or if the condition fails to improve as anticipated.    Dale Botetourt, MD

## 2022-12-23 ENCOUNTER — Encounter: Payer: Self-pay | Admitting: Internal Medicine

## 2022-12-23 NOTE — Assessment & Plan Note (Signed)
Cough and congestion as outlined.  Feeling better.  Discussed covid.  Discussed oral antiviral medication.  Unsure when exactly symptoms started, but appears more than 5 days.  Given already improving and timing, hold on antiviral medication.  No chest pain or sob.  Robitussin DM.  Saline nasal spray and steroid nasal spray as directed.  Follow.  Call with update.  Discussed quarantine precautions.

## 2022-12-23 NOTE — Assessment & Plan Note (Signed)
Had f/u Dr Adriana Simas 06/07/21 - meningioma.  MrI - no interval growth.  Recommended MRI in 18 months. Has f/u scheduled.

## 2022-12-23 NOTE — Assessment & Plan Note (Signed)
Low carb diet and exercise.  Follow met b and a1c.  

## 2022-12-23 NOTE — Assessment & Plan Note (Signed)
Recently diagnosed.  Saw Dr Dwain Sarna 12/07/22 - planning surgery - 01/04/23. Discussed breast hematoma.  Plans to discuss with surgery.

## 2022-12-23 NOTE — Assessment & Plan Note (Signed)
Follow cbc.  

## 2022-12-23 NOTE — Assessment & Plan Note (Signed)
On synthroid.  Follow tsh.   

## 2022-12-23 NOTE — Assessment & Plan Note (Signed)
On omeprazole.  Continue.

## 2022-12-26 ENCOUNTER — Ambulatory Visit: Payer: Medicare HMO | Admitting: Dermatology

## 2022-12-27 ENCOUNTER — Ambulatory Visit: Payer: Medicare HMO | Admitting: Dermatology

## 2022-12-27 VITALS — BP 142/87 | HR 72

## 2022-12-27 DIAGNOSIS — Z85828 Personal history of other malignant neoplasm of skin: Secondary | ICD-10-CM

## 2022-12-27 DIAGNOSIS — L570 Actinic keratosis: Secondary | ICD-10-CM

## 2022-12-27 DIAGNOSIS — L578 Other skin changes due to chronic exposure to nonionizing radiation: Secondary | ICD-10-CM

## 2022-12-27 DIAGNOSIS — L821 Other seborrheic keratosis: Secondary | ICD-10-CM | POA: Diagnosis not present

## 2022-12-27 DIAGNOSIS — W908XXA Exposure to other nonionizing radiation, initial encounter: Secondary | ICD-10-CM | POA: Diagnosis not present

## 2022-12-27 DIAGNOSIS — L820 Inflamed seborrheic keratosis: Secondary | ICD-10-CM

## 2022-12-27 DIAGNOSIS — L738 Other specified follicular disorders: Secondary | ICD-10-CM

## 2022-12-27 DIAGNOSIS — L814 Other melanin hyperpigmentation: Secondary | ICD-10-CM | POA: Diagnosis not present

## 2022-12-27 DIAGNOSIS — Z1283 Encounter for screening for malignant neoplasm of skin: Secondary | ICD-10-CM

## 2022-12-27 NOTE — Progress Notes (Signed)
Follow-Up Visit   Subjective  Sarah Perry is a 72 y.o. female who presents for the following: Recheck previously treated BCC sites at the R ankle and L popliteal. Patient has noticed a dry patch on the R jaw that she would like checked today.  The patient has spots, moles and lesions to be evaluated, some may be new or changing and the patient may have concern these could be cancer.   The following portions of the chart were reviewed this encounter and updated as appropriate: medications, allergies, medical history  Review of Systems:  No other skin or systemic complaints except as noted in HPI or Assessment and Plan.  Objective  Well appearing patient in no apparent distress; mood and affect are within normal limits.   A focused examination was performed of the following areas: the face, neck, chest, arms, hands   Relevant exam findings are noted in the Assessment and Plan.  R ant jaw x 1, R wrist x 1 (2) Erythematous stuck-on, waxy papule   lower nasal dorsum Erythematous thin papules/macules with gritty scale.     Assessment & Plan   ACTINIC DAMAGE - chronic, secondary to cumulative UV radiation exposure/sun exposure over time - diffuse scaly erythematous macules with underlying dyspigmentation - Recommend daily broad spectrum sunscreen SPF 30+ to sun-exposed areas, reapply every 2 hours as needed.  - Recommend staying in the shade or wearing long sleeves, sun glasses (UVA+UVB protection) and wide brim hats (4-inch brim around the entire circumference of the hat). - Call for new or changing lesions.  SEBORRHEIC KERATOSIS - Stuck-on, waxy, tan-brown papules and/or plaques  - Benign-appearing - Discussed benign etiology and prognosis. - Observe - Call for any changes  LENTIGINES Exam: scattered tan macules Due to sun exposure Treatment Plan: Benign-appearing, observe. Recommend daily broad spectrum sunscreen SPF 30+ to sun-exposed areas, reapply every 2 hours  as needed.  Call for any changes  Inflamed seborrheic keratosis (2) R ant jaw x 1, R wrist x 1  Symptomatic, irritating, patient would like treated.   Destruction of lesion - R ant jaw x 1, R wrist x 1 (2)  Destruction method: cryotherapy   Informed consent: discussed and consent obtained   Lesion destroyed using liquid nitrogen: Yes   Region frozen until ice ball extended beyond lesion: Yes   Outcome: patient tolerated procedure well with no complications   Post-procedure details: wound care instructions given   Additional details:  Prior to procedure, discussed risks of blister formation, small wound, skin dyspigmentation, or rare scar following cryotherapy. Recommend Vaseline ointment to treated areas while healing.   AK (actinic keratosis) lower nasal dorsum  Actinic keratoses are precancerous spots that appear secondary to cumulative UV radiation exposure/sun exposure over time. They are chronic with expected duration over 1 year. A portion of actinic keratoses will progress to squamous cell carcinoma of the skin. It is not possible to reliably predict which spots will progress to skin cancer and so treatment is recommended to prevent development of skin cancer.  Recommend daily broad spectrum sunscreen SPF 30+ to sun-exposed areas, reapply every 2 hours as needed.  Recommend staying in the shade or wearing long sleeves, sun glasses (UVA+UVB protection) and wide brim hats (4-inch brim around the entire circumference of the hat). Call for new or changing lesions.   Destruction of lesion - lower nasal dorsum  Destruction method: cryotherapy   Informed consent: discussed and consent obtained   Lesion destroyed using liquid nitrogen: Yes  Region frozen until ice ball extended beyond lesion: Yes   Outcome: patient tolerated procedure well with no complications   Post-procedure details: wound care instructions given   Additional details:  Prior to procedure, discussed risks of  blister formation, small wound, skin dyspigmentation, or rare scar following cryotherapy. Recommend Vaseline ointment to treated areas while healing.   Sebaceous Hyperplasia - Small yellow papules with a central dell - Benign-appearing - Observe. Call for changes.  HISTORY OF BASAL CELL CARCINOMA OF THE SKIN - No evidence of recurrence today, multiple sites including face, nose (Mohs), see history - Recommend regular full body skin exams - Recommend daily broad spectrum sunscreen SPF 30+ to sun-exposed areas, reapply every 2 hours as needed.  - Call if any new or changing lesions are noted between office visits   Return in about 6 months (around 06/26/2023) for TBSE, recheck AK on nose .  Maylene Roes, CMA, am acting as scribe for Willeen Niece, MD .   Documentation: I have reviewed the above documentation for accuracy and completeness, and I agree with the above.  Willeen Niece, MD

## 2022-12-27 NOTE — Patient Instructions (Signed)

## 2022-12-28 ENCOUNTER — Encounter (HOSPITAL_BASED_OUTPATIENT_CLINIC_OR_DEPARTMENT_OTHER): Payer: Self-pay | Admitting: General Surgery

## 2022-12-30 ENCOUNTER — Encounter: Payer: Self-pay | Admitting: Internal Medicine

## 2023-01-01 NOTE — Telephone Encounter (Signed)
Called Ms Lhommedieu.  Had to leave a message.  Thank her for the update.  Continue f/u with Dr Dwain Sarna.  Let me know if any problems or questions.

## 2023-01-03 ENCOUNTER — Ambulatory Visit
Admission: RE | Admit: 2023-01-03 | Discharge: 2023-01-03 | Disposition: A | Discharge status: Home / Self Care | Payer: Medicare HMO | Source: Ambulatory Visit | Attending: General Surgery | Admitting: General Surgery

## 2023-01-03 DIAGNOSIS — D0511 Intraductal carcinoma in situ of right breast: Secondary | ICD-10-CM | POA: Diagnosis not present

## 2023-01-03 HISTORY — PX: BREAST BIOPSY: SHX20

## 2023-01-03 MED ORDER — ENSURE PRE-SURGERY PO LIQD: 296.0000 mL | Freq: Once | ORAL | Status: DC

## 2023-01-03 NOTE — Progress Notes (Signed)
Surgical soap and ERAS drink given to patient with instructions for use.  Patient verbalized understanding of instructions.

## 2023-01-04 ENCOUNTER — Encounter (HOSPITAL_BASED_OUTPATIENT_CLINIC_OR_DEPARTMENT_OTHER)
Admission: RE | Disposition: A | Discharge status: Home / Self Care | Payer: Self-pay | Source: Home / Self Care | Attending: General Surgery

## 2023-01-04 ENCOUNTER — Ambulatory Visit (HOSPITAL_BASED_OUTPATIENT_CLINIC_OR_DEPARTMENT_OTHER)
Admission: RE | Admit: 2023-01-04 | Discharge: 2023-01-04 | Disposition: A | Discharge status: Home / Self Care | Payer: Medicare HMO | Attending: General Surgery | Admitting: General Surgery

## 2023-01-04 ENCOUNTER — Ambulatory Visit (HOSPITAL_BASED_OUTPATIENT_CLINIC_OR_DEPARTMENT_OTHER): Payer: Medicare HMO | Admitting: Anesthesiology

## 2023-01-04 ENCOUNTER — Other Ambulatory Visit: Payer: Self-pay

## 2023-01-04 ENCOUNTER — Ambulatory Visit
Admission: RE | Admit: 2023-01-04 | Discharge: 2023-01-04 | Disposition: A | Discharge status: Home / Self Care | Payer: Medicare HMO | Source: Ambulatory Visit | Attending: General Surgery | Admitting: General Surgery

## 2023-01-04 ENCOUNTER — Encounter (HOSPITAL_BASED_OUTPATIENT_CLINIC_OR_DEPARTMENT_OTHER): Payer: Self-pay | Admitting: General Surgery

## 2023-01-04 DIAGNOSIS — E039 Hypothyroidism, unspecified: Secondary | ICD-10-CM | POA: Diagnosis not present

## 2023-01-04 DIAGNOSIS — D0511 Intraductal carcinoma in situ of right breast: Secondary | ICD-10-CM | POA: Insufficient documentation

## 2023-01-04 DIAGNOSIS — Z8041 Family history of malignant neoplasm of ovary: Secondary | ICD-10-CM | POA: Insufficient documentation

## 2023-01-04 DIAGNOSIS — Z01818 Encounter for other preprocedural examination: Secondary | ICD-10-CM

## 2023-01-04 DIAGNOSIS — Z803 Family history of malignant neoplasm of breast: Secondary | ICD-10-CM | POA: Insufficient documentation

## 2023-01-04 DIAGNOSIS — K219 Gastro-esophageal reflux disease without esophagitis: Secondary | ICD-10-CM | POA: Diagnosis not present

## 2023-01-04 DIAGNOSIS — M1990 Unspecified osteoarthritis, unspecified site: Secondary | ICD-10-CM | POA: Diagnosis not present

## 2023-01-04 DIAGNOSIS — N641 Fat necrosis of breast: Secondary | ICD-10-CM | POA: Diagnosis not present

## 2023-01-04 DIAGNOSIS — D649 Anemia, unspecified: Secondary | ICD-10-CM | POA: Diagnosis not present

## 2023-01-04 HISTORY — PX: BREAST LUMPECTOMY WITH RADIOACTIVE SEED LOCALIZATION: SHX6424

## 2023-01-04 SURGERY — BREAST LUMPECTOMY WITH RADIOACTIVE SEED LOCALIZATION
Anesthesia: General | Laterality: Right

## 2023-01-04 MED ORDER — CHLORHEXIDINE GLUCONATE CLOTH 2 % EX PADS: 6.0000 | MEDICATED_PAD | Freq: Once | CUTANEOUS | Status: DC

## 2023-01-04 MED ORDER — OXYCODONE HCL 5 MG/5ML PO SOLN: 5.0000 mg | Freq: Once | ORAL | Status: AC | PRN

## 2023-01-04 MED ORDER — ACETAMINOPHEN 500 MG PO TABS
ORAL_TABLET | ORAL | Status: AC
  Filled 2023-01-04: qty 2

## 2023-01-04 MED ORDER — ACETAMINOPHEN 10 MG/ML IV SOLN: 1000.0000 mg | Freq: Once | INTRAVENOUS | Status: DC | PRN

## 2023-01-04 MED ORDER — PROMETHAZINE HCL 25 MG/ML IJ SOLN: 6.2500 mg | INTRAMUSCULAR | Status: DC | PRN

## 2023-01-04 MED ORDER — ACETAMINOPHEN 160 MG/5ML PO SOLN: 325.0000 mg | ORAL | Status: DC | PRN

## 2023-01-04 MED ORDER — ONDANSETRON HCL 4 MG/2ML IJ SOLN
INTRAMUSCULAR | Status: DC | PRN
  Administered 2023-01-04: 4 mg via INTRAVENOUS

## 2023-01-04 MED ORDER — LIDOCAINE 2% (20 MG/ML) 5 ML SYRINGE
INTRAMUSCULAR | Status: DC | PRN
  Administered 2023-01-04: 40 mg via INTRAVENOUS

## 2023-01-04 MED ORDER — CEFAZOLIN SODIUM-DEXTROSE 2-4 GM/100ML-% IV SOLN
INTRAVENOUS | Status: AC
  Filled 2023-01-04: qty 100

## 2023-01-04 MED ORDER — EPHEDRINE SULFATE-NACL 50-0.9 MG/10ML-% IV SOSY
PREFILLED_SYRINGE | INTRAVENOUS | Status: DC | PRN
Start: 2023-01-04 — End: 2023-01-04
  Administered 2023-01-04: 5 mg via INTRAVENOUS

## 2023-01-04 MED ORDER — CEFAZOLIN SODIUM-DEXTROSE 2-4 GM/100ML-% IV SOLN: 2.0000 g | INTRAVENOUS | Status: DC

## 2023-01-04 MED ORDER — OXYCODONE HCL 5 MG PO TABS
5.0000 mg | ORAL_TABLET | Freq: Once | ORAL | Status: AC | PRN
  Administered 2023-01-04: 5 mg via ORAL

## 2023-01-04 MED ORDER — FENTANYL CITRATE (PF) 250 MCG/5ML IJ SOLN
INTRAMUSCULAR | Status: DC | PRN
  Administered 2023-01-04: 50 ug via INTRAVENOUS

## 2023-01-04 MED ORDER — PROPOFOL 10 MG/ML IV BOLUS
INTRAVENOUS | Status: DC | PRN
  Administered 2023-01-04: 200 mg via INTRAVENOUS

## 2023-01-04 MED ORDER — ACETAMINOPHEN 500 MG PO TABS
1000.0000 mg | ORAL_TABLET | ORAL | Status: AC
  Administered 2023-01-04: 1000 mg via ORAL

## 2023-01-04 MED ORDER — ACETAMINOPHEN 325 MG PO TABS: 325.0000 mg | ORAL_TABLET | ORAL | Status: DC | PRN

## 2023-01-04 MED ORDER — DEXAMETHASONE SODIUM PHOSPHATE 10 MG/ML IJ SOLN
INTRAMUSCULAR | Status: DC | PRN
  Administered 2023-01-04: 10 mg via INTRAVENOUS

## 2023-01-04 MED ORDER — PROPOFOL 500 MG/50ML IV EMUL
INTRAVENOUS | Status: AC
  Filled 2023-01-04: qty 50

## 2023-01-04 MED ORDER — LACTATED RINGERS IV SOLN: INTRAVENOUS | Status: DC

## 2023-01-04 MED ORDER — BUPIVACAINE HCL (PF) 0.25 % IJ SOLN
INTRAMUSCULAR | Status: DC | PRN
  Administered 2023-01-04: 14 mL

## 2023-01-04 MED ORDER — OXYCODONE HCL 5 MG PO TABS
ORAL_TABLET | ORAL | Status: AC
  Filled 2023-01-04: qty 1

## 2023-01-04 MED ORDER — CEFAZOLIN SODIUM-DEXTROSE 2-3 GM-%(50ML) IV SOLR
INTRAVENOUS | Status: DC | PRN
  Administered 2023-01-04: 2 g via INTRAVENOUS

## 2023-01-04 MED ORDER — PROPOFOL 10 MG/ML IV BOLUS
INTRAVENOUS | Status: AC
  Filled 2023-01-04: qty 20

## 2023-01-04 MED ORDER — FENTANYL CITRATE (PF) 100 MCG/2ML IJ SOLN
INTRAMUSCULAR | Status: AC
  Filled 2023-01-04: qty 2

## 2023-01-04 MED ORDER — FENTANYL CITRATE (PF) 100 MCG/2ML IJ SOLN: 25.0000 ug | INTRAMUSCULAR | Status: DC | PRN

## 2023-01-04 MED ORDER — DROPERIDOL 2.5 MG/ML IJ SOLN: 0.6250 mg | Freq: Once | INTRAMUSCULAR | Status: DC | PRN

## 2023-01-04 SURGICAL SUPPLY — 55 items
ADH SKN CLS APL DERMABOND .7 (GAUZE/BANDAGES/DRESSINGS) ×1
APL PRP STRL LF DISP 70% ISPRP (MISCELLANEOUS) ×1
APPLIER CLIP 9.375 MED OPEN (MISCELLANEOUS)
APR CLP MED 9.3 20 MLT OPN (MISCELLANEOUS)
BINDER BREAST LRG (GAUZE/BANDAGES/DRESSINGS) IMPLANT
BINDER BREAST MEDIUM (GAUZE/BANDAGES/DRESSINGS) IMPLANT
BINDER BREAST XLRG (GAUZE/BANDAGES/DRESSINGS) IMPLANT
BINDER BREAST XXLRG (GAUZE/BANDAGES/DRESSINGS) IMPLANT
BLADE SURG 15 STRL LF DISP TIS (BLADE) ×1 IMPLANT
BLADE SURG 15 STRL SS (BLADE) ×1
CANISTER SUC SOCK COL 7IN (MISCELLANEOUS) IMPLANT
CANISTER SUCT 1200ML W/VALVE (MISCELLANEOUS) IMPLANT
CHLORAPREP W/TINT 26 (MISCELLANEOUS) ×1 IMPLANT
CLIP APPLIE 9.375 MED OPEN (MISCELLANEOUS) IMPLANT
CLIP TI WIDE RED SMALL 6 (CLIP) IMPLANT
COVER BACK TABLE 60X90IN (DRAPES) ×1 IMPLANT
COVER MAYO STAND STRL (DRAPES) ×1 IMPLANT
COVER PROBE CYLINDRICAL 5X96 (MISCELLANEOUS) ×1 IMPLANT
DERMABOND ADVANCED .7 DNX12 (GAUZE/BANDAGES/DRESSINGS) ×1 IMPLANT
DRAPE LAPAROSCOPIC ABDOMINAL (DRAPES) ×1 IMPLANT
DRAPE UTILITY XL STRL (DRAPES) ×1 IMPLANT
DRSG TEGADERM 4X4.75 (GAUZE/BANDAGES/DRESSINGS) IMPLANT
ELECT COATED BLADE 2.86 ST (ELECTRODE) ×1 IMPLANT
ELECT REM PT RETURN 9FT ADLT (ELECTROSURGICAL) ×1
ELECTRODE REM PT RTRN 9FT ADLT (ELECTROSURGICAL) ×1 IMPLANT
GAUZE SPONGE 4X4 12PLY STRL LF (GAUZE/BANDAGES/DRESSINGS) IMPLANT
GLOVE BIO SURGEON STRL SZ7 (GLOVE) ×2 IMPLANT
GLOVE BIOGEL PI IND STRL 7.5 (GLOVE) ×1 IMPLANT
GOWN STRL REUS W/ TWL LRG LVL3 (GOWN DISPOSABLE) ×2 IMPLANT
GOWN STRL REUS W/TWL LRG LVL3 (GOWN DISPOSABLE) ×2
HEMOSTAT ARISTA ABSORB 3G PWDR (HEMOSTASIS) IMPLANT
KIT MARKER MARGIN INK (KITS) ×1 IMPLANT
NDL HYPO 25X1 1.5 SAFETY (NEEDLE) ×1 IMPLANT
NEEDLE HYPO 25X1 1.5 SAFETY (NEEDLE) ×1
NS IRRIG 1000ML POUR BTL (IV SOLUTION) IMPLANT
PACK BASIN DAY SURGERY FS (CUSTOM PROCEDURE TRAY) ×1 IMPLANT
PENCIL SMOKE EVACUATOR (MISCELLANEOUS) ×1 IMPLANT
RETRACTOR ONETRAX LX 90X20 (MISCELLANEOUS) IMPLANT
SLEEVE SCD COMPRESS KNEE MED (STOCKING) ×1 IMPLANT
SPIKE FLUID TRANSFER (MISCELLANEOUS) IMPLANT
SPONGE T-LAP 4X18 ~~LOC~~+RFID (SPONGE) ×1 IMPLANT
STRIP CLOSURE SKIN 1/2X4 (GAUZE/BANDAGES/DRESSINGS) ×1 IMPLANT
SUT MNCRL AB 4-0 PS2 18 (SUTURE) ×1 IMPLANT
SUT MON AB 5-0 PS2 18 (SUTURE) IMPLANT
SUT SILK 2 0 SH (SUTURE) IMPLANT
SUT VIC AB 2-0 SH 27 (SUTURE) ×2
SUT VIC AB 2-0 SH 27XBRD (SUTURE) ×1 IMPLANT
SUT VIC AB 3-0 SH 27 (SUTURE) ×1
SUT VIC AB 3-0 SH 27X BRD (SUTURE) ×1 IMPLANT
SUT VIC AB 5-0 PS2 18 (SUTURE) IMPLANT
SYR CONTROL 10ML LL (SYRINGE) ×1 IMPLANT
TOWEL GREEN STERILE FF (TOWEL DISPOSABLE) ×1 IMPLANT
TRAY FAXITRON CT DISP (TRAY / TRAY PROCEDURE) ×1 IMPLANT
TUBE CONNECTING 20X1/4 (TUBING) IMPLANT
YANKAUER SUCT BULB TIP NO VENT (SUCTIONS) IMPLANT

## 2023-01-04 NOTE — Op Note (Signed)
Preoperative diagnosis: Right breast DCIS Postoperative diagnosis: Same as above Procedure:Right breast seed guided lumpectomy Surgeon: Dr. Harden Mo Anesthesia: General Estimated blood loss: Minimal Complications: None Drains:none Specimens: 1.  Right breast tissue containing clip and seed marked with paint 2.  Additional inferior, posterior , superior and medial margins marked short superior, long lateral, double deep Sponge and needle count was correct at completion Disposition recovery stable addition   Indications: 72 year old female who is otherwise healthy.  She underwent a screening mammogram that shows C density breast tissues there were right upper outer quadrant calcifications noted. Mag views showed a 1.9 x 1.2 cm area of calcifications in the lower outer quadrant of the right breast. Biopsy was performed. This shows high-grade ductal carcinoma in situ. We discussed proceeding with lumpectomy.  Procedure: After informed consent was obtained she was taken to the operating room.  She was given Ancef.  SCDs were in place.  She was placed under general anesthesia without complication.  She was prepped and draped in the standard sterile surgical fashion.  Surgical timeout was then performed.   I located the seed in the lateral breast.  I infiltrated marcaine and made a curvilinear incision in order to hide the scar latera.   I then used the neoprobe to remove the seed and the surrounding tissue with an attempt to get a clear margin.  I thought all my margins were clear but on imaging I thought several could be close so I removed these and marked as above. Hemostasis was observed.  I then closed the cavity with 2-0 Vicryl and the skin was closed with 3-0 Vicryl and 5-0 Monocryl.  Glue and Steri-Strips were applied.  She tolerated this well was extubated and transferred recovery stable.

## 2023-01-04 NOTE — Interval H&P Note (Signed)
History and Physical Interval Note:  01/04/2023 9:48 AM  Sarah Perry  has presented today for surgery, with the diagnosis of RIGHT BREAST DUCTAL CARCINOMA IN SITU.  The various methods of treatment have been discussed with the patient and family. After consideration of risks, benefits and other options for treatment, the patient has consented to  Procedure(s) with comments: RIGHT BREAST SEED GUIDED LUMPECTOMY (Right) - LMA as a surgical intervention.  The patient's history has been reviewed, patient examined, no change in status, stable for surgery.  I have reviewed the patient's chart and labs.  Questions were answered to the patient's satisfaction.     Emelia Loron

## 2023-01-04 NOTE — Anesthesia Postprocedure Evaluation (Signed)
Anesthesia Post Note  Patient: Sarah Perry  Procedure(s) Performed: RIGHT BREAST SEED GUIDED LUMPECTOMY (Right)     Patient location during evaluation: PACU Anesthesia Type: General Level of consciousness: awake and alert Pain management: pain level controlled Vital Signs Assessment: post-procedure vital signs reviewed and stable Respiratory status: spontaneous breathing, nonlabored ventilation, respiratory function stable and patient connected to nasal cannula oxygen Cardiovascular status: blood pressure returned to baseline and stable Postop Assessment: no apparent nausea or vomiting Anesthetic complications: no  No notable events documented.  Last Vitals:  Vitals:   01/04/23 1130 01/04/23 1145  BP: (!) 150/87 (!) 164/84  Pulse:  74  Resp:  15  Temp:  36.6 C  SpO2:  97%    Last Pain:  Vitals:   01/04/23 1145  TempSrc:   PainSc: 3                  Sidnee Gambrill L Matina Rodier

## 2023-01-04 NOTE — Anesthesia Procedure Notes (Addendum)
Procedure Name: LMA Insertion Date/Time: 01/04/2023 10:21 AM  Performed by: Alvera Novel, CRNAPre-anesthesia Checklist: Patient identified, Emergency Drugs available, Suction available and Patient being monitored Patient Re-evaluated:Patient Re-evaluated prior to induction Oxygen Delivery Method: Circle System Utilized Preoxygenation: Pre-oxygenation with 100% oxygen Induction Type: IV induction Ventilation: Mask ventilation without difficulty LMA: LMA inserted LMA Size: 4.0 Number of attempts: 1 Airway Equipment and Method: Bite block Placement Confirmation: positive ETCO2 Tube secured with: Tape Dental Injury: Teeth and Oropharynx as per pre-operative assessment

## 2023-01-04 NOTE — Transfer of Care (Signed)
Immediate Anesthesia Transfer of Care Note  Patient: Sarah Perry  Procedure(s) Performed: RIGHT BREAST SEED GUIDED LUMPECTOMY (Right)  Patient Location: PACU  Anesthesia Type:General  Level of Consciousness: awake, alert , and oriented  Airway & Oxygen Therapy: Patient Spontanous Breathing  Post-op Assessment: Report given to RN and Post -op Vital signs reviewed and stable  Post vital signs: Reviewed and stable  Last Vitals:  Vitals Value Taken Time  BP 145/83 01/04/23 1104  Temp    Pulse 64 01/04/23 1108  Resp 17 01/04/23 1108  SpO2 97 % 01/04/23 1108  Vitals shown include unfiled device data.  Last Pain:  Vitals:   01/04/23 0944  TempSrc: Oral  PainSc: 0-No pain      Patients Stated Pain Goal: 4 (01/04/23 0944)  Complications: No notable events documented.

## 2023-01-04 NOTE — H&P (Signed)
72 year old female who is otherwise healthy. She has no prior breast history. She does have a family history of ovarian cancer in her mom and a sister with breast cancer. She does want to proceed with genetic testing. She had no mass or discharge. She underwent a screening mammogram that shows C density breast tissues there were right upper outer quadrant calcifications noted. Mag views showed a 1.9 x 1.2 cm area of calcifications in the lower outer quadrant of the right breast. Biopsy was performed. This shows high-grade ductal carcinoma in situ. The pathology was done at an outside facility and does not do hormone receptors. She has been evaluated by Dr. Orlie Dakin. She is here to discuss her surgical options today. She is with her husband. They are about to leave for a trip to [place] on Saturday.  Review of Systems: A complete review of systems was obtained from the patient. I have reviewed this information and discussed as appropriate with the patient. See HPI as well for other ROS.  Review of Systems  All other systems reviewed and are negative.   Medical History: Past Medical History:  Diagnosis Date  Arthritis  Basal cell carcinoma 05/2019  on nose  GERD (gastroesophageal reflux disease) 2004  Hypothyroidism 12/2020  Meningioma (CMS/HHS-HCC) 10/2018  2x3.5x3.5 cm in sella   Patient Active Problem List  Diagnosis  Degenerative arthritis  GERD (gastroesophageal reflux disease)  Hyperglycemia  Menopausal syndrome  Meningioma (CMS/HHS-HCC)  Subclinical hypothyroidism  Benign neoplasm of sella turcica (CMS/HHS-HCC)  Personal history of other malignant neoplasm of skin   Past Surgical History:  Procedure Laterality Date  COLONOSCOPY 12/06/2009  long/redundant colon, sigmoid diverticulosis, small internal hemorrhoids; repeat in 11/2019.  basal cell carcinoma removal 05/2019  on nose  TONSILLECTOMY    Allergies  Allergen Reactions  Penicillins Unknown and Other (See Comments)    Current Outpatient Medications on File Prior to Visit  Medication Sig Dispense Refill  aspirin 81 MG EC tablet Take 81 mg by mouth every other day  calcium carbonate-vitamin D3 (OS-CAL 500+D) 500 mg(1,250mg ) -400 unit tablet Take by mouth  levothyroxine (SYNTHROID) 50 MCG tablet Take 1 tablet (50 mcg total) by mouth once daily Take on an empty stomach with a glass of water at least 30-60 minutes before breakfast. 90 tablet 1  LUMIGAN 0.01 % ophthalmic solution Place 1 drop into both eyes at bedtime  magnesium oxide (MAG-OX) 400 mg (241.3 mg magnesium) tablet Take 400 mg by mouth once daily  multivitamin tablet Take by mouth  omeprazole (PRILOSEC) 20 MG DR capsule Take by mouth  venlafaxine (EFFEXOR-XR) 75 MG XR capsule Take 75 mg by mouth once daily    Family History  Problem Relation Age of Onset  Cancer Mother  Ovarian  Myocardial Infarction (Heart attack) Mother  Hyperparathyroidism Mother  s/p parathyroid surgery  Dementia Father  Neuropathy Father  Stroke Father  Diabetes Maternal Uncle    Social History   Tobacco Use  Smoking Status Never  Smokeless Tobacco Never  Marital status: Married  Tobacco Use  Smoking status: Never  Smokeless tobacco: Never  Vaping Use  Vaping status: Never Used  Substance and Sexual Activity  Alcohol use: Yes  Drug use: Never  Sexual activity: Not Currently  Partners: Male  Birth control/protection: None     Objective:   Vitals:  12/07/22 1021 12/07/22 1026  BP: 138/78  Pulse: 82  Temp: 36.5 C (97.7 F)  SpO2: 97%  Weight: 76.9 kg (169 lb 9.6 oz)  Height: 180.3  cm (5\' 11" )  PainSc: 0-No pain  PainLoc: Breast   Body mass index is 23.65 kg/m.  Physical Exam Vitals reviewed.  Constitutional:  Appearance: Normal appearance.  Chest:  Breasts: Right: No inverted nipple or nipple discharge.  Left: No mass.  Comments: Large right breast hematoma Lymphadenopathy:  Upper Body:  Right upper body: No supraclavicular or  axillary adenopathy.  Neurological:  Mental Status: She is alert.    Assessment and Plan:   Ductal carcinoma in situ (DCIS) of right breast  Right breast seed guided lumpectomy  She was recommended an MRI by radiology and they are reporting. We discussed the reasoning that I do not think she needs an MRI. We are going to proceed without that.  We discussed the staging and pathophysiology of breast cancer. We discussed all of the different options for treatment for breast cancer including surgery, radiation therapy, and antiestrogen therapy.  We discussed a sentinel lymph node biopsy and do not think she needs on now with dcis. Discussed possible upgrade at surgery but may not need one anyways so will await final path.   We discussed the options for treatment of the breast cancer which included lumpectomy versus a mastectomy. We discussed the performance of the lumpectomy with radioactive seed placement. We discussed a 5-10% chance of a positive margin requiring reexcision in the operating room. We also discussed that she will likely need radiation therapy if she undergoes lumpectomy. We discussed mastectomy and the postoperative care for that as well. Mastectomy can be followed by reconstruction. Most mastectomy patients will not need radiation therapy. We discussed that there is no difference in her survival whether she undergoes lumpectomy with radiation therapy or antiestrogen therapy versus a mastectomy. There is also no real difference between her recurrence in the breast.  Will wait until after trip and this will give hematoma some time to resolve.  We discussed the risks of operation including bleeding, infection, possible reoperation. She understands her further therapy will be based on what her stages at the time of her operation.

## 2023-01-04 NOTE — Discharge Instructions (Addendum)
[facility] Office Phone Number [phone]  POST OP INSTRUCTIONS Take 400 mg of ibuprofen every 8 hours or 650 mg tylenol every 6 hours for next 72 hours then as needed. Use ice several times daily also.  A prescription for pain medication may be given to you upon discharge.  Take your pain medication as prescribed, if needed.  If narcotic pain medicine is not needed, then you may take acetaminophen (Tylenol), naprosyn (Alleve) or ibuprofen (Advil) as needed. Take your usually prescribed medications unless otherwise directed If you need a refill on your pain medication, please contact your pharmacy.  They will contact our office to request authorization.  Prescriptions will not be filled after 5pm or on week-ends. You should eat very light the first 24 hours after surgery, such as soup, crackers, pudding, etc.  Resume your normal diet the day after surgery. Most patients will experience some swelling and bruising in the breast.  Ice packs and a good support bra will help.  Wear the breast binder provided or a sports bra for 72 hours day and night.  After that wear a sports bra during the day until you return to the office. Swelling and bruising can take several days to resolve.  It is common to experience some constipation if taking pain medication after surgery.  Increasing fluid intake and taking a stool softener will usually help or prevent this problem from occurring.  A mild laxative (Milk of Magnesia or Miralax) should be taken according to package directions if there are no bowel movements after 48 hours. I used skin glue on the incision, you may shower in 24 hours.  The glue will flake off over the next 2-3 weeks.  Any sutures or staples will be removed at the office during your follow-up visit. ACTIVITIES:  You may resume regular daily activities (gradually increasing) beginning the next day.  Wearing a good support bra or sports bra minimizes pain and swelling.  You may have  sexual intercourse when it is comfortable. You may drive when you no longer are taking prescription pain medication, you can comfortably wear a seatbelt, and you can safely maneuver your car and apply brakes. RETURN TO WORK:  ______________________________________________________________________________________ Bonita Quin should see your doctor in the office for a follow-up appointment approximately two weeks after your surgery.  Your doctor's nurse will typically make your follow-up appointment when she calls you with your pathology report.  Expect your pathology report 3-4 business days after your surgery.  You may call to check if you do not hear from [place] after three days. OTHER INSTRUCTIONS: _______________________________________________________________________________________________ _____________________________________________________________________________________________________________________________________ _____________________________________________________________________________________________________________________________________ _____________________________________________________________________________________________________________________________________  WHEN TO CALL DR WAKEFIELD: Fever over 101.0 Nausea and/or vomiting. Extreme swelling or bruising. Continued bleeding from incision. Increased pain, redness, or drainage from the incision.  The clinic staff is available to answer your questions during regular business hours.  Please don't hesitate to call and ask to speak to one of the nurses for clinical concerns.  If you have a medical emergency, go to the nearest emergency room or call 911.  A surgeon from [facility] is always on call at the hospital.  For further questions, please visit [email or web address] May have tylenol at 3:45pm if needed  Post Anesthesia Home Care Instructions  Activity: Get plenty of rest for the remainder of the day. A  responsible individual must stay with you for 24 hours following the procedure.  For the next 24 hours, DO NOT: -Drive a car -Advertising copywriter -Drink alcoholic beverages -  Take any medication unless instructed by your physician -Make any legal decisions or sign important papers.  Meals: Start with liquid foods such as gelatin or soup. Progress to regular foods as tolerated. Avoid greasy, spicy, heavy foods. If nausea and/or vomiting occur, drink only clear liquids until the nausea and/or vomiting subsides. Call your physician if vomiting continues.  Special Instructions/Symptoms: Your throat may feel dry or sore from the anesthesia or the breathing tube placed in your throat during surgery. If this causes discomfort, gargle with warm salt water. The discomfort should disappear within 24 hours.  If you had a scopolamine patch placed behind your ear for the management of post- operative nausea and/or vomiting:  1. The medication in the patch is effective for 72 hours, after which it should be removed.  Wrap patch in a tissue and discard in the trash. Wash hands thoroughly with soap and water. 2. You may remove the patch earlier than 72 hours if you experience unpleasant side effects which may include dry mouth, dizziness or visual disturbances. 3. Avoid touching the patch. Wash your hands with soap and water after contact with the patch.

## 2023-01-04 NOTE — Anesthesia Preprocedure Evaluation (Addendum)
Anesthesia Evaluation  Patient identified by MRN, date of birth, ID band Patient awake    Reviewed: Allergy & Precautions, NPO status , Patient's Chart, lab work & pertinent test results  Airway Mallampati: II  TM Distance: >3 FB Neck ROM: Full    Dental  (+) Teeth Intact, Dental Advisory Given   Pulmonary neg pulmonary ROS   breath sounds clear to auscultation       Cardiovascular negative cardio ROS  Rhythm:Regular Rate:Normal     Neuro/Psych  Neuromuscular disease  negative psych ROS   GI/Hepatic Neg liver ROS,GERD  Medicated,,  Endo/Other  Hypothyroidism    Renal/GU negative Renal ROS     Musculoskeletal  (+) Arthritis ,    Abdominal   Peds  Hematology  (+) Blood dyscrasia, anemia   Anesthesia Other Findings   Reproductive/Obstetrics                             Anesthesia Physical Anesthesia Plan  ASA: 2  Anesthesia Plan: General   Post-op Pain Management: Tylenol PO (pre-op)* and Toradol IV (intra-op)*   Induction: Intravenous  PONV Risk Score and Plan: 4 or greater and Ondansetron and Treatment may vary due to age or medical condition  Airway Management Planned: LMA  Additional Equipment: None  Intra-op Plan:   Post-operative Plan: Extubation in OR  Informed Consent: I have reviewed the patients History and Physical, chart, labs and discussed the procedure including the risks, benefits and alternatives for the proposed anesthesia with the patient or authorized representative who has indicated his/her understanding and acceptance.     Dental advisory given  Plan Discussed with: CRNA  Anesthesia Plan Comments:        Anesthesia Quick Evaluation

## 2023-01-05 ENCOUNTER — Encounter (HOSPITAL_BASED_OUTPATIENT_CLINIC_OR_DEPARTMENT_OTHER): Payer: Self-pay | Admitting: General Surgery

## 2023-01-09 ENCOUNTER — Ambulatory Visit
Admission: RE | Admit: 2023-01-09 | Discharge: 2023-01-09 | Disposition: A | Discharge status: Home / Self Care | Payer: Medicare HMO | Source: Ambulatory Visit | Attending: Neurosurgery | Admitting: Neurosurgery

## 2023-01-09 DIAGNOSIS — D329 Benign neoplasm of meninges, unspecified: Secondary | ICD-10-CM | POA: Insufficient documentation

## 2023-01-09 DIAGNOSIS — I6782 Cerebral ischemia: Secondary | ICD-10-CM | POA: Diagnosis not present

## 2023-01-09 DIAGNOSIS — D320 Benign neoplasm of cerebral meninges: Secondary | ICD-10-CM | POA: Diagnosis not present

## 2023-01-09 MED ORDER — GADOBUTROL 1 MMOL/ML IV SOLN
7.5000 mL | Freq: Once | INTRAVENOUS | Status: AC | PRN
  Administered 2023-01-09: 7.5 mL via INTRAVENOUS

## 2023-01-10 ENCOUNTER — Inpatient Hospital Stay: Payer: Medicare HMO | Attending: Oncology | Admitting: Licensed Clinical Social Worker

## 2023-01-10 ENCOUNTER — Inpatient Hospital Stay: Payer: Medicare HMO | Attending: Oncology

## 2023-01-10 DIAGNOSIS — Z8042 Family history of malignant neoplasm of prostate: Secondary | ICD-10-CM

## 2023-01-10 DIAGNOSIS — Z8041 Family history of malignant neoplasm of ovary: Secondary | ICD-10-CM | POA: Diagnosis not present

## 2023-01-10 DIAGNOSIS — D0511 Intraductal carcinoma in situ of right breast: Secondary | ICD-10-CM | POA: Diagnosis not present

## 2023-01-10 DIAGNOSIS — Z800 Family history of malignant neoplasm of digestive organs: Secondary | ICD-10-CM

## 2023-01-10 DIAGNOSIS — Z803 Family history of malignant neoplasm of breast: Secondary | ICD-10-CM

## 2023-01-10 DIAGNOSIS — Z808 Family history of malignant neoplasm of other organs or systems: Secondary | ICD-10-CM | POA: Diagnosis not present

## 2023-01-10 LAB — SURGICAL PATHOLOGY

## 2023-01-10 NOTE — Progress Notes (Signed)
REFERRING PROVIDER: Emelia Loron, MD [address] [place],  [place] [address]  PRIMARY PROVIDER:  Dale [place], MD  PRIMARY REASON FOR VISIT:  1. Ductal carcinoma in situ (DCIS) of right breast   2. Family history of breast cancer   3. Family history of ovarian cancer   4. Family history of pancreatic cancer   5. Family history of melanoma   6. Family history of skin cancer   7. Family history of prostate cancer      HISTORY OF PRESENT ILLNESS:   Sarah Perry, a 72 y.o. female, was seen for a [facility] cancer genetics consultation at the request of Dr. Dwain Sarna due to a personal and family history of cancer.  Sarah Perry presents to clinic today to discuss the possibility of a hereditary predisposition to cancer, genetic testing, and to further clarify her future cancer risks, as well as potential cancer risks for family members.   CANCER HISTORY:  Oncology History  Ductal carcinoma in situ (DCIS) of right breast  12/05/2022 Initial Diagnosis   Ductal carcinoma in situ (DCIS) of right breast   12/05/2022 Cancer Staging   Staging form: Breast, AJCC 8th Edition - Clinical stage from 12/05/2022: Stage 0 (cTis (DCIS), cN0, cM0) - Signed by Jeralyn Ruths, MD on 12/05/2022 Stage prefix: Initial diagnosis    In 2024, at the age of 7, Sarah Perry was diagnosed with DCIS of the right breast. The treatment plan includes lumpectomy (completed 01/04/2023), adjuvant radiation and possibly adjuvant endocrine therapy. 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  RISK FACTORS:  Menarche was at age 33.  First live birth at age 57.  Ovaries intact: yes.  Hysterectomy: no.  Menopausal status: postmenopausal.  Colonoscopy: yes; normal. Mammogram within the last year: yes.  Past Medical History:  Diagnosis Date   Basal cell carcinoma 12/08/2014   L spinal mid back    Basal cell carcinoma 12/08/2014   R spinal upper back    Basal cell carcinoma 12/08/2014    L upper sternum    Basal cell carcinoma 02/11/2018   L med pretibial    Basal cell carcinoma 02/11/2018   L lower pretibial    Basal cell carcinoma 09/17/2018   R shoulder    Basal cell carcinoma 09/17/2018   L lat pretibial    Basal cell carcinoma 02/18/2019   R parietal scalp    Basal cell carcinoma 02/28/2019   Central nasal tip    Basal cell carcinoma 09/23/2019   R mid chin    Basal cell carcinoma 09/23/2019   R clavicle    Basal cell carcinoma 10/14/2020   left superior medial forehead, Moh's 02/07/21   Basal cell carcinoma 10/14/2020   right medial cheek, Moh's 02/07/21   Basal cell carcinoma 11/08/2021   L nasal ala, Mohs 01/09/2022   BCC (basal cell carcinoma of skin) 10/14/2020   L preauricular, exc 12/27/20   BCC (basal cell carcinoma of skin) 06/06/2022   left popliteal, edc   BCC (basal cell carcinoma of skin) 06/06/2022   left popliteal, edc   Degenerative arthritis    hips, knees   GERD (gastroesophageal reflux disease)    Varicella zoster 01/2008    Past Surgical History:  Procedure Laterality Date   BASAL CELL CARCINOMA EXCISION     BREAST BIOPSY Right 11/29/2022   Right Stereo Bx, X clip- path pending   BREAST BIOPSY Right 11/29/2022   MM RT BREAST BX W  LOC DEV 1ST LESION IMAGE BX SPEC STEREO GUIDE 11/29/2022 [facility]   BREAST BIOPSY  01/03/2023   MM RT RADIOACTIVE SEED LOC MAMMO GUIDE 01/03/2023 [facility]   BREAST LUMPECTOMY WITH RADIOACTIVE SEED LOCALIZATION Right 01/04/2023   Procedure: RIGHT BREAST SEED GUIDED LUMPECTOMY;  Surgeon: Emelia Loron, MD;  Location: [facility];  Service: General;  Laterality: Right;  LMA   DILATION AND CURETTAGE OF UTERUS     TONSILECTOMY/ADENOIDECTOMY WITH MYRINGOTOMY      FAMILY HISTORY:  We obtained a detailed, 4-generation family history.  Significant diagnoses are listed below: Family History  Problem Relation Age of Onset   Ovarian cancer Mother    Hypercholesterolemia Mother     Dementia Father    Prostate cancer Father    Melanoma Sister    Breast cancer Sister 68   Pancreatic cancer Maternal Aunt    Breast cancer Paternal Aunt    Sarah Perry has 1 son, 2, 1 daughter, 46. She has 3 sisters. One sister had melanoma at 67. Another sister had basal cell and squamous cell skin cancers at 12 and breast cancer at 54. She had genetic testing in May 2024 that was normal ([facility]-Expanded panel through [place]).   Sarah Perry's mother had ovarian cancer at 36 and passed at 21. A maternal aunt died of pancreatic cancer at 52 (diagnosed at 33).   Sarah Perry's father had prostate cancer at 87, passed at 29. A paternal aunt had breast cancer at 5 and passed at 72.   Sarah Perry is aware of previous family history of genetic testing for hereditary cancer risks. There is no reported Ashkenazi Jewish ancestry. There is no known consanguinity.    GENETIC COUNSELING ASSESSMENT: Sarah Perry is a 73 y.o. female with a personal and family history of cancer which is somewhat suggestive of a hereditary cancer syndrome and predisposition to cancer. We, therefore, discussed and recommended the following at today's visit.   DISCUSSION: We discussed that approximately 10% of breast cancer is hereditary. Most cases of hereditary breast cancer are associated with BRCA1/BRCA2 genes, although there are other genes associated with hereditary cancer as well. Cancers and risks are gene specific. We discussed that testing is beneficial for several reasons including knowing about cancer risks, identifying potential screening and risk-reduction options that may be appropriate, and to understand if other family members could be at risk for cancer and allow them to undergo genetic testing.   We reviewed the characteristics, features and inheritance patterns of hereditary cancer syndromes. We also discussed genetic testing, including the appropriate family members to test, the process of testing,  insurance coverage and turn-around-time for results. We discussed the implications of a negative, positive and/or variant of uncertain significant result. We recommended Sarah Perry pursue genetic testing for the [facility] Multi-Cancer+RNA gene panel.   Based on Sarah Perry's personal and family history of cancer, she meets medical criteria for genetic testing. Despite that she meets criteria, she may still have an out of pocket cost.   PLAN: After considering the risks, benefits, and limitations, Sarah Perry provided informed consent to pursue genetic testing and the blood sample was sent to [facility] for analysis of the Multi-Cancer+RNA panel. Results should be available within approximately 2-3 weeks' time, at which point they will be disclosed by telephone to Sarah Perry, as will any additional recommendations warranted by these results. Sarah Perry will receive a summary of her genetic counseling visit and a copy of her results once available. This  information will also be available in Epic.   Sarah Perry questions were answered to her satisfaction today. Our contact information was provided should additional questions or concerns arise. Thank you for the referral and allowing [place] to share in the care of your patient.   Lacy Duverney, MS, [facility] Genetic Counselor [email or web address] Phone: [phone]  The patient was seen for a total of 20 minutes in face-to-face genetic counseling.  Dr. Blake Divine was available for discussion regarding this case.   _______________________________________________________________________ For Office Staff:  Number of people involved in session: 1 Was an Intern/ student involved with case: no

## 2023-01-18 ENCOUNTER — Encounter: Payer: Self-pay | Admitting: Oncology

## 2023-01-18 ENCOUNTER — Ambulatory Visit
Admission: RE | Admit: 2023-01-18 | Discharge: 2023-01-18 | Disposition: A | Discharge status: Home / Self Care | Payer: Medicare HMO | Source: Ambulatory Visit | Attending: Radiation Oncology | Admitting: Radiation Oncology

## 2023-01-18 ENCOUNTER — Encounter: Payer: Self-pay | Admitting: Radiation Oncology

## 2023-01-18 ENCOUNTER — Inpatient Hospital Stay: Payer: Medicare HMO | Admitting: Oncology

## 2023-01-18 VITALS — BP 123/79 | HR 74 | Temp 98.5°F | Resp 16 | Ht 71.0 in | Wt 171.0 lb

## 2023-01-18 DIAGNOSIS — D0511 Intraductal carcinoma in situ of right breast: Secondary | ICD-10-CM | POA: Insufficient documentation

## 2023-01-18 DIAGNOSIS — M1990 Unspecified osteoarthritis, unspecified site: Secondary | ICD-10-CM | POA: Insufficient documentation

## 2023-01-18 DIAGNOSIS — Z7982 Long term (current) use of aspirin: Secondary | ICD-10-CM | POA: Insufficient documentation

## 2023-01-18 DIAGNOSIS — Z170 Estrogen receptor positive status [ER+]: Secondary | ICD-10-CM | POA: Insufficient documentation

## 2023-01-18 DIAGNOSIS — K219 Gastro-esophageal reflux disease without esophagitis: Secondary | ICD-10-CM | POA: Insufficient documentation

## 2023-01-18 DIAGNOSIS — Z79890 Hormone replacement therapy (postmenopausal): Secondary | ICD-10-CM | POA: Insufficient documentation

## 2023-01-18 DIAGNOSIS — Z85828 Personal history of other malignant neoplasm of skin: Secondary | ICD-10-CM | POA: Insufficient documentation

## 2023-01-18 NOTE — Consult Note (Signed)
NEW PATIENT EVALUATION  Name: Sarah Perry  MRN: [id number]  Date:   01/18/2023     DOB: 11-08-1950   This 72 y.o. 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  REFERRING PHYSICIAN: [employer], MD  CHIEF COMPLAINT: No chief complaint on file.   DIAGNOSIS: The encounter diagnosis was Ductal carcinoma in situ (DCIS) of right breast.   PREVIOUS INVESTIGATIONS:  Mammogram ultrasound reviewed Pathology reports reviewed Clinical notes reviewed  HPI: Patient is a 72 year old female who presents with abnormal mammogram of the right breast showing calcifications warranting further investigation.  Calcifications were in the upper outer quadrant of the right breast at anterior depth.  Calcifications spanned 1.9 x 1.2 cm.  She underwent targeted biopsy which was positive for ductal carcinoma in situ.  She went on to have a wide local excision showing ER positive ductal carcinoma in situ 1.8 cm intermediate to high-grade with necrosis and calcifications.  There is no evidence of invasive cancer.  All margins were clear except for 0.5 mm lateral margin.  She had multiple areas of reexcision at the time of lumpectomy of the superior medial and posterior margins.  She has done well postoperatively but was still sore with ecchymosis present in the resection site.  She otherwise specifically denies cough or bone pain.  PLANNED TREATMENT REGIMEN: Hypofractionated whole breast radiation  PAST MEDICAL HISTORY:  has a past medical history of Basal cell carcinoma (12/08/2014), Basal cell carcinoma (12/08/2014), Basal cell carcinoma (12/08/2014), Basal cell carcinoma (02/11/2018), Basal cell carcinoma (02/11/2018), Basal cell carcinoma (09/17/2018), Basal cell carcinoma (09/17/2018), Basal cell carcinoma (02/18/2019), Basal cell carcinoma (02/28/2019), Basal cell carcinoma  (09/23/2019), Basal cell carcinoma (09/23/2019), Basal cell carcinoma (10/14/2020), Basal cell carcinoma (10/14/2020), Basal cell carcinoma (11/08/2021), BCC (basal cell carcinoma of skin) (10/14/2020), BCC (basal cell carcinoma of skin) (06/06/2022), BCC (basal cell carcinoma of skin) (06/06/2022), Degenerative arthritis, GERD (gastroesophageal reflux disease), and Varicella zoster (01/2008).    PAST SURGICAL HISTORY:  Past Surgical History:  Procedure Laterality Date   BASAL CELL CARCINOMA EXCISION     BREAST BIOPSY Right 11/29/2022   Right Stereo Bx, X clip- path pending   BREAST BIOPSY Right 11/29/2022   MM RT BREAST BX W LOC DEV 1ST LESION IMAGE BX SPEC STEREO GUIDE 11/29/2022 [facility]   BREAST BIOPSY  01/03/2023   MM RT RADIOACTIVE SEED LOC MAMMO GUIDE 01/03/2023 [facility]   BREAST LUMPECTOMY WITH RADIOACTIVE SEED LOCALIZATION Right 01/04/2023   Procedure: RIGHT BREAST SEED GUIDED LUMPECTOMY;  Surgeon: Emelia Loron, MD;  Location: [facility];  Service: General;  Laterality: Right;  LMA   DILATION AND CURETTAGE OF UTERUS     TONSILECTOMY/ADENOIDECTOMY WITH MYRINGOTOMY      FAMILY HISTORY: family history includes Breast cancer in her paternal aunt; Breast cancer (age of onset: 36) in her sister; Dementia in her father; Hypercholesterolemia in her mother; Melanoma in her sister; Ovarian cancer in her mother; Pancreatic cancer in her maternal aunt; Prostate cancer in her father.  SOCIAL HISTORY:  reports that she has never smoked. She has never used smokeless tobacco. She reports current alcohol use. She reports that she does not use drugs.  ALLERGIES: Penicillins  MEDICATIONS:  Current Outpatient Medications  Medication Sig Dispense Refill   aspirin 81 MG EC tablet Take 1 tablet by mouth daily.     calcium-vitamin D (OSCAL-500) 500-400  MG-UNIT tablet Take 1 tablet by mouth daily.      levothyroxine (SYNTHROID) 50 MCG tablet Take 50 mcg by mouth daily.      LUMIGAN 0.01 % SOLN SMARTSIG:In Eye(s)     Magnesium Oxide 400 (240 Mg) MG TABS      Multiple Vitamin (MULTIVITAMIN) tablet Take 1 tablet by mouth daily.     omeprazole (PRILOSEC) 20 MG capsule Take 1 capsule (20 mg total) by mouth daily. 90 capsule 3   venlafaxine XR (EFFEXOR-XR) 75 MG 24 hr capsule Take 1 capsule (75 mg total) by mouth daily. 90 capsule 1   No current facility-administered medications for this encounter.    ECOG PERFORMANCE STATUS:  0 - Asymptomatic  REVIEW OF SYSTEMS: Patient has multiple excisions for basal cell carcinoma. Patient denies any weight loss, fatigue, weakness, fever, chills or night sweats. Patient denies any loss of vision, blurred vision. Patient denies any ringing  of the ears or hearing loss. No irregular heartbeat. Patient denies heart murmur or history of fainting. Patient denies any chest pain or pain radiating to her upper extremities. Patient denies any shortness of breath, difficulty breathing at night, cough or hemoptysis. Patient denies any swelling in the lower legs. Patient denies any nausea vomiting, vomiting of blood, or coffee ground material in the vomitus. Patient denies any stomach pain. Patient states has had normal bowel movements no significant constipation or diarrhea. Patient denies any dysuria, hematuria or significant nocturia. Patient denies any problems walking, swelling in the joints or loss of balance. Patient denies any skin changes, loss of hair or loss of weight. Patient denies any excessive worrying or anxiety or significant depression. Patient denies any problems with insomnia. Patient denies excessive thirst, polyuria, polydipsia. Patient denies any swollen glands, patient denies easy bruising or easy bleeding. Patient denies any recent infections, allergies or URI. Patient "s visual fields have not changed significantly in recent time.   PHYSICAL EXAM: There were no vitals taken for this visit. Patient status post wide local  excision of the right breast.  Incisions healing well.  There is ecchymosis surrounding the lumpectomy site.  No other dominant masses noted in either breast no axillary or supraclavicular adenopathy is identified.  Well-developed well-nourished patient in NAD. HEENT reveals PERLA, EOMI, discs not visualized.  Oral cavity is clear. No oral mucosal lesions are identified. Neck is clear without evidence of cervical or supraclavicular adenopathy. Lungs are clear to A&P. Cardiac examination is essentially unremarkable with regular rate and rhythm without murmur rub or thrill. Abdomen is benign with no organomegaly or masses noted. Motor sensory and DTR levels are equal and symmetric in the upper and lower extremities. Cranial nerves II through XII are grossly intact. Proprioception is intact. No peripheral adenopathy or edema is identified. No motor or sensory levels are noted. Crude visual fields are within normal range.  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  I would treat with hypofractionated whole breast radiation over 3 weeks boosting her scar another 1600 centigrade using photon beam therapy.  Risks and benefits of treatment including skin reaction fatigue alteration of blood counts possible inclusion of superficial lung all were reviewed in detail with the patient.  Patient  also will benefit from endocrine therapy after completion of radiation.  Patient and husband both comprehend our treatment plan well.  I have personally set up and ordered CT simulation about a week and a half to allow some further healing.  I would like to take this opportunity to thank you for allowing me to participate  in the care of your patient.Carmina Miller, MD

## 2023-01-18 NOTE — Progress Notes (Signed)
[facility]  [phone] [phone]  ID: Sarah Perry OB: 1951/04/03  MR#: [id number]  NUU#:[id number]  Patient Care Team: Dale Palmetto, MD as PCP - General (Internal Medicine) Hulen Luster, RN as Oncology Nurse Navigator  CHIEF COMPLAINT: DCIS right breast.  INTERVAL HISTORY: Patient returns to clinic today for further follow-up and discussion of her final pathology results.  She underwent lumpectomy on January 04, 2023 and tolerated her procedure well.  She currently feels well and is asymptomatic. She has no neurologic complaints.  She denies any recent fevers or illnesses.  She has a good appetite and denies weight loss.  She has no chest pain, shortness of breath, cough, or hemoptysis.  She denies any nausea, vomiting, constipation, or diarrhea.  She has no urinary complaints.  Patient offers no specific complaints today.  REVIEW OF SYSTEMS:   Review of Systems  Constitutional: Negative.  Negative for fever, malaise/fatigue and weight loss.  Respiratory: Negative.  Negative for cough, hemoptysis and shortness of breath.   Cardiovascular: Negative.  Negative for chest pain and leg swelling.  Gastrointestinal: Negative.  Negative for abdominal pain.  Genitourinary: Negative.  Negative for dysuria.  Musculoskeletal: Negative.  Negative for back pain.  Neurological: Negative.  Negative for dizziness, focal weakness, weakness and headaches.  Psychiatric/Behavioral: Negative.  The patient is not nervous/anxious.     As per HPI. Otherwise, a complete review of systems is negative.  PAST MEDICAL HISTORY: Past Medical History:  Diagnosis Date   Basal cell carcinoma 12/08/2014   L spinal mid back    Basal cell carcinoma 12/08/2014   R spinal upper back    Basal cell carcinoma 12/08/2014   L upper sternum    Basal cell carcinoma 02/11/2018   L med pretibial    Basal cell carcinoma 02/11/2018   L lower pretibial    Basal cell carcinoma  09/17/2018   R shoulder    Basal cell carcinoma 09/17/2018   L lat pretibial    Basal cell carcinoma 02/18/2019   R parietal scalp    Basal cell carcinoma 02/28/2019   Central nasal tip    Basal cell carcinoma 09/23/2019   R mid chin    Basal cell carcinoma 09/23/2019   R clavicle    Basal cell carcinoma 10/14/2020   left superior medial forehead, Moh's 02/07/21   Basal cell carcinoma 10/14/2020   right medial cheek, Moh's 02/07/21   Basal cell carcinoma 11/08/2021   L nasal ala, Mohs 01/09/2022   BCC (basal cell carcinoma of skin) 10/14/2020   L preauricular, exc 12/27/20   BCC (basal cell carcinoma of skin) 06/06/2022   left popliteal, edc   BCC (basal cell carcinoma of skin) 06/06/2022   left popliteal, edc   Degenerative arthritis    hips, knees   GERD (gastroesophageal reflux disease)    Varicella zoster 01/2008    PAST SURGICAL HISTORY: Past Surgical History:  Procedure Laterality Date   BASAL CELL CARCINOMA EXCISION     BREAST BIOPSY Right 11/29/2022   Right Stereo Bx, X clip- path pending   BREAST BIOPSY Right 11/29/2022   MM RT BREAST BX W LOC DEV 1ST LESION IMAGE BX SPEC STEREO GUIDE 11/29/2022 [facility]   BREAST BIOPSY  01/03/2023   MM RT RADIOACTIVE SEED LOC MAMMO GUIDE 01/03/2023 [facility]   BREAST LUMPECTOMY WITH RADIOACTIVE SEED LOCALIZATION Right 01/04/2023   Procedure: RIGHT BREAST SEED GUIDED LUMPECTOMY;  Surgeon: Emelia Loron, MD;  Location: [facility];  Service: General;  Laterality: Right;  LMA   DILATION AND CURETTAGE OF UTERUS     TONSILECTOMY/ADENOIDECTOMY WITH MYRINGOTOMY      FAMILY HISTORY: Family History  Problem Relation Age of Onset   Ovarian cancer Mother    Hypercholesterolemia Mother    Dementia Father    Prostate cancer Father    Melanoma Sister    Breast cancer Sister 1   Pancreatic cancer Maternal Aunt    Breast cancer Paternal Aunt     ADVANCED DIRECTIVES (Y/N):  N  HEALTH  MAINTENANCE: Social History   Tobacco Use   Smoking status: Never   Smokeless tobacco: Never  Vaping Use   Vaping status: Never Used  Substance Use Topics   Alcohol use: Yes    Alcohol/week: 0.0 standard drinks of alcohol    Comment: glass of wine daily   Drug use: No     Colonoscopy:  PAP:  Bone density:  Lipid panel:  Allergies  Allergen Reactions   Penicillins Other (See Comments)    Unsure from childhood     Current Outpatient Medications  Medication Sig Dispense Refill   aspirin 81 MG EC tablet Take 1 tablet by mouth daily.     calcium-vitamin D (OSCAL-500) 500-400 MG-UNIT tablet Take 1 tablet by mouth daily.      levothyroxine (SYNTHROID) 50 MCG tablet Take 50 mcg by mouth daily.     LUMIGAN 0.01 % SOLN SMARTSIG:In Eye(s)     Magnesium Oxide 400 (240 Mg) MG TABS      Multiple Vitamin (MULTIVITAMIN) tablet Take 1 tablet by mouth daily.     omeprazole (PRILOSEC) 20 MG capsule Take 1 capsule (20 mg total) by mouth daily. 90 capsule 3   venlafaxine XR (EFFEXOR-XR) 75 MG 24 hr capsule Take 1 capsule (75 mg total) by mouth daily. 90 capsule 1   No current facility-administered medications for this visit.    OBJECTIVE: Vitals:   01/18/23 1026  BP: 123/79  Pulse: 74  Resp: 16  Temp: 98.5 F (36.9 C)  SpO2: 100%     Body mass index is 23.85 kg/m.    ECOG FS:0 - Asymptomatic  General: Well-developed, well-nourished, no acute distress. Eyes: Pink conjunctiva, anicteric sclera. HEENT: Normocephalic, moist mucous membranes. Lungs: No audible wheezing or coughing. Heart: Regular rate and rhythm. Abdomen: Soft, nontender, no obvious distention. Musculoskeletal: No edema, cyanosis, or clubbing. Neuro: Alert, answering all questions appropriately. Cranial nerves grossly intact. Skin: No rashes or petechiae noted. Psych: Normal affect.  LAB RESULTS:  Lab Results  Component Value Date   NA 140 06/20/2022   K 4.7 06/20/2022   CL 101 06/20/2022   CO2 32  06/20/2022   GLUCOSE 101 (H) 06/20/2022   BUN 15 06/20/2022   CREATININE 0.74 06/20/2022   CALCIUM 9.9 06/20/2022   PROT 6.8 06/20/2022   ALBUMIN 4.3 06/20/2022   AST 24 06/20/2022   ALT 16 06/20/2022   ALKPHOS 107 06/20/2022   BILITOT 0.6 06/20/2022   GFRNONAA >60 03/22/2019   GFRAA >60 03/22/2019    Lab Results  Component Value Date   WBC 7.5 12/20/2021   NEUTROABS 5.4 12/20/2021   HGB 12.7 12/20/2021   HCT 37.4 12/20/2021   MCV 91.8 12/20/2021   PLT 249.0 12/20/2021     STUDIES: MM Breast Surgical Specimen  Result Date: 01/04/2023 CLINICAL DATA:  Post right breast lumpectomy. EXAM: SPECIMEN RADIOGRAPH OF THE RIGHT BREAST COMPARISON:  Previous exam(s). FINDINGS: Status post excision of the right  breast. The radioactive seed and biopsy marker clip are present, completely intact, and were marked for pathology. IMPRESSION: Specimen radiograph of the right breast. Electronically Signed   By: Edwin Cap M.D.   On: 01/04/2023 10:51   MM RT RADIOACTIVE SEED LOC MAMMO GUIDE  Result Date: 01/03/2023 CLINICAL DATA:  Patient presents for radioactive seed localization of a focus of right breast DCIS prior to surgical excision. EXAM: MAMMOGRAPHIC GUIDED RADIOACTIVE SEED LOCALIZATION OF THE RIGHT BREAST COMPARISON:  Previous exam(s). FINDINGS: Patient presents for radioactive seed localization prior to surgical excision. I met with the patient and we discussed the procedure of seed localization including benefits and alternatives. We discussed the high likelihood of a successful procedure. We discussed the risks of the procedure including infection, bleeding, tissue injury and further surgery. We discussed the low dose of radioactivity involved in the procedure. Informed, written consent was given. The usual time-out protocol was performed immediately prior to the procedure. Using mammographic guidance, sterile technique, 1% lidocaine and an I-125 radioactive seed, the X shaped post biopsy  clip and residual calcifications were localized using a lateral approach. The follow-up mammogram images confirm the seed in the expected location and were marked for Dr. Dwain Sarna. Follow-up survey of the patient confirms presence of the radioactive seed. Order number of I-125 seed:  [id number]. Total activity:  0.247 millicuries.  Reference Date: 11/30/2022. The patient tolerated the procedure well and [facility]. She was given instructions regarding seed removal. IMPRESSION: Radioactive seed localization of the right breast. No apparent complications. Electronically Signed   By: Amie Portland M.D.   On: 01/03/2023 13:24    ASSESSMENT: DCIS right breast, ER positive, PR negative.  PLAN:    DCIS right breast: Patient underwent lumpectomy on January 04, 2023 confirming diagnosis.  She does not require adjuvant chemotherapy.  Patient has an appointment with radiation oncology later this morning to discuss adjuvant XRT.  At the conclusion of her XRT, patient will benefit from 5 years of tamoxifen.  No further intervention is needed.  Return to clinic in approximately 6 to 8 weeks at the conclusion of her XRT for further evaluation and initiation of treatment.    I spent a total of 30 minutes reviewing chart data, face-to-face evaluation with the patient, counseling and coordination of care as detailed above.    Patient expressed understanding and was in agreement with this plan. She also understands that She can call clinic at any time with any questions, concerns, or complaints.    Cancer Staging  Ductal carcinoma in situ (DCIS) of right breast Staging form: Breast, AJCC 8th Edition - Clinical stage from 12/05/2022: Stage 0 (cTis (DCIS), cN0, cM0) - Signed by Jeralyn Ruths, MD on 12/05/2022 Stage prefix: Initial diagnosis   Jeralyn Ruths, MD   01/18/2023 11:56 AM

## 2023-01-22 ENCOUNTER — Ambulatory Visit: Payer: Self-pay | Admitting: Licensed Clinical Social Worker

## 2023-01-22 ENCOUNTER — Telehealth: Payer: Self-pay | Admitting: Licensed Clinical Social Worker

## 2023-01-22 ENCOUNTER — Encounter: Payer: Self-pay | Admitting: Licensed Clinical Social Worker

## 2023-01-22 DIAGNOSIS — Z1379 Encounter for other screening for genetic and chromosomal anomalies: Secondary | ICD-10-CM | POA: Insufficient documentation

## 2023-01-22 NOTE — Progress Notes (Signed)
HPI:   Sarah Perry was previously seen in the [facility] due to a personal and family history of cancer and concerns regarding a hereditary predisposition to cancer. Please refer to our prior cancer genetics clinic note for more information regarding our discussion, assessment and recommendations, at the time. Sarah Perry recent genetic test results were disclosed to her, as were recommendations warranted by these results. These results and recommendations are discussed in more detail below.  CANCER HISTORY:  Oncology History  Ductal carcinoma in situ (DCIS) of right breast  12/05/2022 Initial Diagnosis   Ductal carcinoma in situ (DCIS) of right breast   12/05/2022 Cancer Staging   Staging form: Breast, AJCC 8th Edition - Clinical stage from 12/05/2022: Stage 0 (cTis (DCIS), cN0, cM0) - Signed by Jeralyn Ruths, MD on 12/05/2022 Stage prefix: Initial diagnosis    Genetic Testing   No pathogenic variants identified on the [facility] Multi-Cancer+RNA panel. The report date is 01/20/2023.  The Multi-Cancer + RNA Panel offered by [facility] includes sequencing and/or deletion/duplication analysis of the following 70 genes:  AIP*, ALK, APC*, ATM*, AXIN2*, BAP1*, BARD1*, BLM*, BMPR1A*, BRCA1*, BRCA2*, BRIP1*, CDC73*, CDH1*, CDK4, CDKN1B*, CDKN2A, CHEK2*, CTNNA1*, DICER1*, EPCAM, EGFR, FH*, FLCN*, GREM1, HOXB13, KIT, LZTR1, MAX*, MBD4, MEN1*, MET, MITF, MLH1*, MSH2*, MSH3*, MSH6*, MUTYH*, NF1*, NF2*, NTHL1*, PALB2*, PDGFRA, PMS2*, POLD1*, POLE*, POT1*, PRKAR1A*, PTCH1*, PTEN*, RAD51C*, RAD51D*, RB1*, RET, SDHA*, SDHAF2*, SDHB*, SDHC*, SDHD*, SMAD4*, SMARCA4*, SMARCB1*, SMARCE1*, STK11*, SUFU*, TMEM127*, TP53*, TSC1*, TSC2*, VHL*. RNA analysis is performed for * genes.     FAMILY HISTORY:  We obtained a detailed, 4-generation family history.  Significant diagnoses are listed below: Family History  Problem Relation Age of Onset   Ovarian cancer Mother    Hypercholesterolemia Mother     Dementia Father    Prostate cancer Father    Melanoma Sister    Breast cancer Sister 56   Pancreatic cancer Maternal Aunt    Breast cancer Paternal Aunt    Sarah Perry has 1 son, 21, 1 daughter, 49. She has 3 sisters. One sister had melanoma at 14. Another sister had basal cell and squamous cell skin cancers at 83 and breast cancer at 4. She had genetic testing in May 2024 that was normal ([facility]-Expanded panel through [facility]).    Sarah Perry's mother had ovarian cancer at 59 and passed at 66. A maternal aunt died of pancreatic cancer at 108 (diagnosed at 35).    Sarah Perry's father had prostate cancer at 84, passed at 42. A paternal aunt had breast cancer at 81 and passed at 31.    Sarah Perry is aware of previous family history of genetic testing for hereditary cancer risks. There is no reported Ashkenazi Jewish ancestry. There is no known consanguinity.      GENETIC TEST RESULTS:  The [facility] Multi-Cancer+RNA Panel found no pathogenic mutations.  The Multi-Cancer + RNA Panel offered by [facility] includes sequencing and/or deletion/duplication analysis of the following 70 genes:  AIP*, ALK, APC*, ATM*, AXIN2*, BAP1*, BARD1*, BLM*, BMPR1A*, BRCA1*, BRCA2*, BRIP1*, CDC73*, CDH1*, CDK4, CDKN1B*, CDKN2A, CHEK2*, CTNNA1*, DICER1*, EPCAM, EGFR, FH*, FLCN*, GREM1, HOXB13, KIT, LZTR1, MAX*, MBD4, MEN1*, MET, MITF, MLH1*, MSH2*, MSH3*, MSH6*, MUTYH*, NF1*, NF2*, NTHL1*, PALB2*, PDGFRA, PMS2*, POLD1*, POLE*, POT1*, PRKAR1A*, PTCH1*, PTEN*, RAD51C*, RAD51D*, RB1*, RET, SDHA*, SDHAF2*, SDHB*, SDHC*, SDHD*, SMAD4*, SMARCA4*, SMARCB1*, SMARCE1*, STK11*, SUFU*, TMEM127*, TP53*, TSC1*, TSC2*, VHL*. RNA analysis is performed for * genes.  The test report has been scanned into EPIC and  is located under the Molecular Pathology section of the Results Review tab.  A portion of the result report is included below for reference. Genetic testing reported out on 01/20/2023.     Even though a pathogenic  variant was not identified, possible explanations for the cancer in the family may include: There may be no hereditary risk for cancer in the family. The cancers in Sarah Perry and/or her family may be sporadic/familial or due to other genetic and environmental factors. There may be a gene mutation in one of these genes that current testing methods cannot detect but that chance is small. There could be another gene that has not yet been discovered, or that we have not yet tested, that is responsible for the cancer diagnoses in the family.  It is also possible there is a hereditary cause for the cancer in the family that Sarah Perry did not inherit.   Therefore, it is important to remain in touch with cancer genetics in the future so that we can continue to offer Sarah Perry the most up to date genetic testing.   ADDITIONAL GENETIC TESTING:  We discussed with Sarah Perry that her genetic testing was fairly extensive.  If there are additional relevant genes identified to increase cancer risk that can be analyzed in the future, we would be happy to discuss and coordinate this testing at that time.     CANCER SCREENING RECOMMENDATIONS:  Sarah Perry test result is considered negative (normal).  This means that we have not identified a hereditary cause for her personal and family history of cancer at this time.   An individual's cancer risk and medical management are not determined by genetic test results alone. Overall cancer risk assessment incorporates additional factors, including personal medical history, family history, and any available genetic information that may result in a personalized plan for cancer prevention and surveillance. Therefore, it is recommended she continue to follow the cancer management and screening guidelines provided by her oncology and primary healthcare provider.  RECOMMENDATIONS FOR FAMILY MEMBERS:   Since she did not inherit a identifiable mutation in a cancer predisposition  gene included on this panel, her children could not have inherited a known mutation from her in one of these genes. Individuals in this family might be at some increased risk of developing cancer, over the general population risk, due to the family history of cancer.  Individuals in the family should notify their providers of the family history of cancer. We recommend women in this family have a yearly mammogram beginning at age 53, or 30 years younger than the earliest onset of cancer, an annual clinical breast exam, and perform monthly breast self-exams.  Family members should have colonoscopies by at age 41, or earlier, as recommended by their providers. Other members of the family may still carry a pathogenic variant in one of these genes that Sarah Perry did not inherit. Based on the family history, we recommend her maternal relatives have genetic counseling and testing. Sarah Perry will let us know if we can be of any assistance in coordinating genetic counseling and/or testing for this family member.     FOLLOW-UP:  Lastly, we discussed with Sarah Perry that cancer genetics is a rapidly advancing field and it is possible that new genetic tests will be appropriate for her and/or her family members in the future. We encouraged her to remain in contact with cancer genetics on an annual basis so we can update her personal and family histories and  let her know of advances in cancer genetics that may benefit this family.   Our contact number was provided. Sarah Perry's questions were answered to her satisfaction, and she knows she is welcome to call us at anytime with additional questions or concerns.    Lacy Duverney, MS, [facility] Genetic Counselor [email or web address] Phone: [phone]

## 2023-01-22 NOTE — Telephone Encounter (Signed)
I contacted Ms. Helser to discuss her genetic testing results. No pathogenic variants were identified in the 70 genes analyzed. Detailed clinic note to follow.   The test report has been scanned into EPIC and is located under the Molecular Pathology section of the Results Review tab.  A portion of the result report is included below for reference.      Lacy Duverney, MS, [facility] Genetic Counselor [email or web address] Phone: [phone]

## 2023-01-25 ENCOUNTER — Other Ambulatory Visit: Payer: Self-pay | Admitting: Radiation Oncology

## 2023-01-25 DIAGNOSIS — C801 Malignant (primary) neoplasm, unspecified: Secondary | ICD-10-CM

## 2023-01-29 ENCOUNTER — Encounter: Payer: Self-pay | Admitting: *Deleted

## 2023-01-29 DIAGNOSIS — E221 Hyperprolactinemia: Secondary | ICD-10-CM | POA: Diagnosis not present

## 2023-01-29 DIAGNOSIS — D352 Benign neoplasm of pituitary gland: Secondary | ICD-10-CM | POA: Diagnosis not present

## 2023-01-29 NOTE — Progress Notes (Signed)
Dr. Orlie Dakin would like to see Sarah Perry back at end of radiation.  Appt.  Scheduled for 12/3, details given to her.

## 2023-01-30 ENCOUNTER — Ambulatory Visit
Admission: RE | Admit: 2023-01-30 | Discharge: 2023-01-30 | Disposition: A | Discharge status: Home / Self Care | Payer: Medicare HMO | Source: Ambulatory Visit | Attending: Radiation Oncology | Admitting: Radiation Oncology

## 2023-01-30 DIAGNOSIS — C801 Malignant (primary) neoplasm, unspecified: Secondary | ICD-10-CM

## 2023-01-30 DIAGNOSIS — D0511 Intraductal carcinoma in situ of right breast: Secondary | ICD-10-CM | POA: Diagnosis not present

## 2023-01-30 DIAGNOSIS — Z510 Encounter for antineoplastic radiation therapy: Secondary | ICD-10-CM | POA: Diagnosis not present

## 2023-01-30 DIAGNOSIS — Z170 Estrogen receptor positive status [ER+]: Secondary | ICD-10-CM | POA: Diagnosis not present

## 2023-02-02 ENCOUNTER — Other Ambulatory Visit: Payer: Self-pay | Admitting: *Deleted

## 2023-02-02 DIAGNOSIS — Z510 Encounter for antineoplastic radiation therapy: Secondary | ICD-10-CM | POA: Diagnosis not present

## 2023-02-02 DIAGNOSIS — Z170 Estrogen receptor positive status [ER+]: Secondary | ICD-10-CM | POA: Diagnosis not present

## 2023-02-02 DIAGNOSIS — D0511 Intraductal carcinoma in situ of right breast: Secondary | ICD-10-CM

## 2023-02-06 ENCOUNTER — Ambulatory Visit: Admission: RE | Admit: 2023-02-06 | Payer: Medicare HMO | Source: Ambulatory Visit

## 2023-02-06 DIAGNOSIS — D329 Benign neoplasm of meninges, unspecified: Secondary | ICD-10-CM | POA: Diagnosis not present

## 2023-02-06 DIAGNOSIS — D0511 Intraductal carcinoma in situ of right breast: Secondary | ICD-10-CM | POA: Diagnosis not present

## 2023-02-06 DIAGNOSIS — Z510 Encounter for antineoplastic radiation therapy: Secondary | ICD-10-CM | POA: Diagnosis not present

## 2023-02-06 DIAGNOSIS — Z170 Estrogen receptor positive status [ER+]: Secondary | ICD-10-CM | POA: Diagnosis not present

## 2023-02-07 ENCOUNTER — Other Ambulatory Visit: Payer: Self-pay | Admitting: Neurosurgery

## 2023-02-07 ENCOUNTER — Other Ambulatory Visit: Payer: Self-pay

## 2023-02-07 ENCOUNTER — Ambulatory Visit
Admission: RE | Admit: 2023-02-07 | Discharge: 2023-02-07 | Disposition: A | Discharge status: Home / Self Care | Payer: Medicare HMO | Source: Ambulatory Visit | Attending: Radiation Oncology | Admitting: Radiation Oncology

## 2023-02-07 DIAGNOSIS — Z170 Estrogen receptor positive status [ER+]: Secondary | ICD-10-CM | POA: Diagnosis not present

## 2023-02-07 DIAGNOSIS — D0511 Intraductal carcinoma in situ of right breast: Secondary | ICD-10-CM | POA: Diagnosis not present

## 2023-02-07 DIAGNOSIS — D329 Benign neoplasm of meninges, unspecified: Secondary | ICD-10-CM

## 2023-02-07 DIAGNOSIS — Z510 Encounter for antineoplastic radiation therapy: Secondary | ICD-10-CM | POA: Diagnosis not present

## 2023-02-07 LAB — RAD ONC ARIA SESSION SUMMARY
Course Elapsed Days: 0
Plan Fractions Treated to Date: 1
Plan Prescribed Dose Per Fraction: 2.66 Gy
Plan Total Fractions Prescribed: 16
Plan Total Prescribed Dose: 42.56 Gy
Reference Point Dosage Given to Date: 2.66 Gy
Reference Point Session Dosage Given: 2.66 Gy
Session Number: 1

## 2023-02-08 ENCOUNTER — Ambulatory Visit
Admission: RE | Admit: 2023-02-08 | Discharge: 2023-02-08 | Disposition: A | Discharge status: Home / Self Care | Payer: Medicare HMO | Source: Ambulatory Visit | Attending: Radiation Oncology | Admitting: Radiation Oncology

## 2023-02-08 ENCOUNTER — Other Ambulatory Visit: Payer: Self-pay

## 2023-02-08 DIAGNOSIS — Z170 Estrogen receptor positive status [ER+]: Secondary | ICD-10-CM | POA: Diagnosis not present

## 2023-02-08 DIAGNOSIS — D352 Benign neoplasm of pituitary gland: Secondary | ICD-10-CM | POA: Diagnosis not present

## 2023-02-08 DIAGNOSIS — E221 Hyperprolactinemia: Secondary | ICD-10-CM | POA: Diagnosis not present

## 2023-02-08 DIAGNOSIS — D0511 Intraductal carcinoma in situ of right breast: Secondary | ICD-10-CM | POA: Diagnosis not present

## 2023-02-08 DIAGNOSIS — E039 Hypothyroidism, unspecified: Secondary | ICD-10-CM | POA: Diagnosis not present

## 2023-02-08 DIAGNOSIS — Z510 Encounter for antineoplastic radiation therapy: Secondary | ICD-10-CM | POA: Diagnosis not present

## 2023-02-08 LAB — RAD ONC ARIA SESSION SUMMARY
Course Elapsed Days: 1
Plan Fractions Treated to Date: 2
Plan Prescribed Dose Per Fraction: 2.66 Gy
Plan Total Fractions Prescribed: 16
Plan Total Prescribed Dose: 42.56 Gy
Reference Point Dosage Given to Date: 5.32 Gy
Reference Point Session Dosage Given: 2.66 Gy
Session Number: 2

## 2023-02-09 ENCOUNTER — Ambulatory Visit
Admission: RE | Admit: 2023-02-09 | Discharge: 2023-02-09 | Disposition: A | Discharge status: Home / Self Care | Payer: Medicare HMO | Source: Ambulatory Visit | Attending: Radiation Oncology | Admitting: Radiation Oncology

## 2023-02-09 ENCOUNTER — Other Ambulatory Visit: Payer: Self-pay

## 2023-02-09 DIAGNOSIS — Z170 Estrogen receptor positive status [ER+]: Secondary | ICD-10-CM | POA: Diagnosis not present

## 2023-02-09 DIAGNOSIS — Z510 Encounter for antineoplastic radiation therapy: Secondary | ICD-10-CM | POA: Insufficient documentation

## 2023-02-09 DIAGNOSIS — D0511 Intraductal carcinoma in situ of right breast: Secondary | ICD-10-CM | POA: Insufficient documentation

## 2023-02-09 LAB — RAD ONC ARIA SESSION SUMMARY
Course Elapsed Days: 2
Plan Fractions Treated to Date: 3
Plan Prescribed Dose Per Fraction: 2.66 Gy
Plan Total Fractions Prescribed: 16
Plan Total Prescribed Dose: 42.56 Gy
Reference Point Dosage Given to Date: 7.98 Gy
Reference Point Session Dosage Given: 2.66 Gy
Session Number: 3

## 2023-02-12 ENCOUNTER — Ambulatory Visit
Admission: RE | Admit: 2023-02-12 | Discharge: 2023-02-12 | Disposition: A | Discharge status: Home / Self Care | Payer: Medicare HMO | Source: Ambulatory Visit | Attending: Radiation Oncology | Admitting: Radiation Oncology

## 2023-02-12 ENCOUNTER — Other Ambulatory Visit: Payer: Self-pay

## 2023-02-12 DIAGNOSIS — D0511 Intraductal carcinoma in situ of right breast: Secondary | ICD-10-CM | POA: Diagnosis not present

## 2023-02-12 DIAGNOSIS — Z170 Estrogen receptor positive status [ER+]: Secondary | ICD-10-CM | POA: Diagnosis not present

## 2023-02-12 DIAGNOSIS — Z510 Encounter for antineoplastic radiation therapy: Secondary | ICD-10-CM | POA: Diagnosis not present

## 2023-02-12 LAB — RAD ONC ARIA SESSION SUMMARY
Course Elapsed Days: 5
Plan Fractions Treated to Date: 4
Plan Prescribed Dose Per Fraction: 2.66 Gy
Plan Total Fractions Prescribed: 16
Plan Total Prescribed Dose: 42.56 Gy
Reference Point Dosage Given to Date: 10.64 Gy
Reference Point Session Dosage Given: 2.66 Gy
Session Number: 4

## 2023-02-13 ENCOUNTER — Other Ambulatory Visit: Payer: Self-pay

## 2023-02-13 ENCOUNTER — Ambulatory Visit
Admission: RE | Admit: 2023-02-13 | Discharge: 2023-02-13 | Disposition: A | Discharge status: Home / Self Care | Payer: Medicare HMO | Source: Ambulatory Visit | Attending: Radiation Oncology | Admitting: Radiation Oncology

## 2023-02-13 DIAGNOSIS — Z170 Estrogen receptor positive status [ER+]: Secondary | ICD-10-CM | POA: Diagnosis not present

## 2023-02-13 DIAGNOSIS — Z510 Encounter for antineoplastic radiation therapy: Secondary | ICD-10-CM | POA: Diagnosis not present

## 2023-02-13 DIAGNOSIS — D0511 Intraductal carcinoma in situ of right breast: Secondary | ICD-10-CM | POA: Diagnosis not present

## 2023-02-13 LAB — RAD ONC ARIA SESSION SUMMARY
Course Elapsed Days: 6
Plan Fractions Treated to Date: 5
Plan Prescribed Dose Per Fraction: 2.66 Gy
Plan Total Fractions Prescribed: 16
Plan Total Prescribed Dose: 42.56 Gy
Reference Point Dosage Given to Date: 13.3 Gy
Reference Point Session Dosage Given: 2.66 Gy
Session Number: 5

## 2023-02-14 ENCOUNTER — Other Ambulatory Visit: Payer: Self-pay

## 2023-02-14 ENCOUNTER — Ambulatory Visit
Admission: RE | Admit: 2023-02-14 | Discharge: 2023-02-14 | Disposition: A | Discharge status: Home / Self Care | Payer: Medicare HMO | Source: Ambulatory Visit | Attending: Radiation Oncology | Admitting: Radiation Oncology

## 2023-02-14 ENCOUNTER — Inpatient Hospital Stay: Payer: Medicare HMO | Attending: Oncology

## 2023-02-14 DIAGNOSIS — Z510 Encounter for antineoplastic radiation therapy: Secondary | ICD-10-CM | POA: Diagnosis not present

## 2023-02-14 DIAGNOSIS — Z170 Estrogen receptor positive status [ER+]: Secondary | ICD-10-CM | POA: Diagnosis not present

## 2023-02-14 DIAGNOSIS — D0511 Intraductal carcinoma in situ of right breast: Secondary | ICD-10-CM

## 2023-02-14 LAB — RAD ONC ARIA SESSION SUMMARY
Course Elapsed Days: 7
Plan Fractions Treated to Date: 6
Plan Prescribed Dose Per Fraction: 2.66 Gy
Plan Total Fractions Prescribed: 16
Plan Total Prescribed Dose: 42.56 Gy
Reference Point Dosage Given to Date: 15.96 Gy
Reference Point Session Dosage Given: 2.66 Gy
Session Number: 6

## 2023-02-14 LAB — CBC (CANCER CENTER ONLY)
HCT: 34.9 % — ABNORMAL LOW (ref 36.0–46.0)
Hemoglobin: 11.6 g/dL — ABNORMAL LOW (ref 12.0–15.0)
MCH: 30.4 pg (ref 26.0–34.0)
MCHC: 33.2 g/dL (ref 30.0–36.0)
MCV: 91.4 fL (ref 80.0–100.0)
Platelet Count: 237 10*3/uL (ref 150–400)
RBC: 3.82 MIL/uL — ABNORMAL LOW (ref 3.87–5.11)
RDW: 13 % (ref 11.5–15.5)
WBC Count: 4.1 10*3/uL (ref 4.0–10.5)
nRBC: 0 % (ref 0.0–0.2)

## 2023-02-15 ENCOUNTER — Other Ambulatory Visit: Payer: Self-pay

## 2023-02-15 ENCOUNTER — Ambulatory Visit (INDEPENDENT_AMBULATORY_CARE_PROVIDER_SITE_OTHER): Payer: Medicare HMO | Admitting: Internal Medicine

## 2023-02-15 ENCOUNTER — Ambulatory Visit
Admission: RE | Admit: 2023-02-15 | Discharge: 2023-02-15 | Disposition: A | Discharge status: Home / Self Care | Payer: Medicare HMO | Source: Ambulatory Visit | Attending: Radiation Oncology | Admitting: Radiation Oncology

## 2023-02-15 VITALS — BP 126/72 | HR 78 | Temp 97.9°F | Resp 16 | Ht 70.0 in | Wt 171.8 lb

## 2023-02-15 DIAGNOSIS — K219 Gastro-esophageal reflux disease without esophagitis: Secondary | ICD-10-CM | POA: Diagnosis not present

## 2023-02-15 DIAGNOSIS — D329 Benign neoplasm of meninges, unspecified: Secondary | ICD-10-CM | POA: Diagnosis not present

## 2023-02-15 DIAGNOSIS — Z13220 Encounter for screening for lipoid disorders: Secondary | ICD-10-CM

## 2023-02-15 DIAGNOSIS — D0511 Intraductal carcinoma in situ of right breast: Secondary | ICD-10-CM | POA: Diagnosis not present

## 2023-02-15 DIAGNOSIS — Z510 Encounter for antineoplastic radiation therapy: Secondary | ICD-10-CM | POA: Diagnosis not present

## 2023-02-15 DIAGNOSIS — E039 Hypothyroidism, unspecified: Secondary | ICD-10-CM | POA: Diagnosis not present

## 2023-02-15 DIAGNOSIS — R739 Hyperglycemia, unspecified: Secondary | ICD-10-CM

## 2023-02-15 DIAGNOSIS — Z170 Estrogen receptor positive status [ER+]: Secondary | ICD-10-CM | POA: Diagnosis not present

## 2023-02-15 LAB — RAD ONC ARIA SESSION SUMMARY
Course Elapsed Days: 8
Plan Fractions Treated to Date: 7
Plan Prescribed Dose Per Fraction: 2.66 Gy
Plan Total Fractions Prescribed: 16
Plan Total Prescribed Dose: 42.56 Gy
Reference Point Dosage Given to Date: 18.62 Gy
Reference Point Session Dosage Given: 2.66 Gy
Session Number: 7

## 2023-02-15 NOTE — Progress Notes (Signed)
Subjective:    Patient ID: Sarah Perry, female    DOB: 08/13/1950, 72 y.o.   MRN: [id number]  Patient here for  Chief Complaint  Patient presents with   Medical Management of Chronic Issues    HPI Here for a scheduled follow up.  . Recent abnormal mammogram. 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 Has seen Dr Dwain Sarna. S/p lumpectomy 01/04/23. Evaluated at cancer center.  Receiving XRT.  Doing well. Some fatigue, but overall doing well. Had f/u with Dr Adriana Simas 02/06/23 - f/u cavernous sinus and clinoid meningioma. Elected continued surveillance and f/u MRI in one year. Saw endocrinology 02/08/23 - stable. Mild persistent stable hyperprolactinemia. F/u 12 months. She is doing well.  Feels good.  No chest pain or sob reported.  Stays active.  No abdominal pain or bowel change reported.     Past Medical History:  Diagnosis Date   Basal cell carcinoma 12/08/2014   L spinal mid back    Basal cell carcinoma 12/08/2014   R spinal upper back    Basal cell carcinoma 12/08/2014   L upper sternum    Basal cell carcinoma 02/11/2018   L med pretibial    Basal cell carcinoma 02/11/2018   L lower pretibial    Basal cell carcinoma 09/17/2018   R shoulder    Basal cell carcinoma 09/17/2018   L lat pretibial    Basal cell carcinoma 02/18/2019   R parietal scalp    Basal cell carcinoma 02/28/2019   Central nasal tip    Basal cell carcinoma 09/23/2019   R mid chin    Basal cell carcinoma 09/23/2019   R clavicle    Basal cell carcinoma 10/14/2020   left superior medial forehead, Moh's 02/07/21   Basal cell carcinoma 10/14/2020   right medial cheek, Moh's 02/07/21   Basal cell carcinoma 11/08/2021   L nasal ala, Mohs 01/09/2022   BCC (basal cell carcinoma of skin) 10/14/2020   L preauricular, exc 12/27/20   BCC (basal cell carcinoma of skin) 06/06/2022   left popliteal, edc   BCC (basal cell carcinoma of skin) 06/06/2022   left popliteal, edc   Degenerative arthritis     hips, knees   GERD (gastroesophageal reflux disease)    Varicella zoster 01/2008   Past Surgical History:  Procedure Laterality Date   BASAL CELL CARCINOMA EXCISION     BREAST BIOPSY Right 11/29/2022   Right Stereo Bx, X clip- path pending   BREAST BIOPSY Right 11/29/2022   MM RT BREAST BX W LOC DEV 1ST LESION IMAGE BX SPEC STEREO GUIDE 11/29/2022 [facility]   BREAST BIOPSY  01/03/2023   MM RT RADIOACTIVE SEED LOC MAMMO GUIDE 01/03/2023 [facility]   BREAST LUMPECTOMY WITH RADIOACTIVE SEED LOCALIZATION Right 01/04/2023   Procedure: RIGHT BREAST SEED GUIDED LUMPECTOMY;  Surgeon: Emelia Loron, MD;  Location: [facility];  Service: General;  Laterality: Right;  LMA   DILATION AND CURETTAGE OF UTERUS     TONSILECTOMY/ADENOIDECTOMY WITH MYRINGOTOMY     Family History  Problem Relation Age of Onset   Ovarian cancer Mother    Hypercholesterolemia Mother    Dementia Father    Prostate cancer Father    Melanoma Sister    Breast cancer Sister 62   Pancreatic cancer Maternal Aunt    Breast cancer Paternal Aunt    Social History   Socioeconomic History   Marital status: Married    Spouse name: Not on file  Number of children: 2   Years of education: Not on file   Highest education level: Bachelor's degree (e.g., BA, AB, BS)  Occupational History   Not on file  Tobacco Use   Smoking status: Never   Smokeless tobacco: Never  Vaping Use   Vaping status: Never Used  Substance and Sexual Activity   Alcohol use: Yes    Alcohol/week: 0.0 standard drinks of alcohol    Comment: glass of wine daily   Drug use: No   Sexual activity: Yes    Birth control/protection: None  Other Topics Concern   Not on file  Social History Narrative   Not on file   Social Determinants of Health   Financial Resource Strain: Low Risk  (02/11/2023)   Overall Financial Resource Strain (CARDIA)    Difficulty of Paying Living Expenses: Not hard at all  Food Insecurity: No  Food Insecurity (02/11/2023)   Hunger Vital Sign    Worried About Running Out of Food in the Last Year: Never true    Ran Out of Food in the Last Year: Never true  Transportation Needs: No Transportation Needs (02/11/2023)   PRAPARE - Administrator, Civil Service (Medical): No    Lack of Transportation (Non-Medical): No  Physical Activity: Sufficiently Active (02/11/2023)   Exercise Vital Sign    Days of Exercise per Week: 5 days    Minutes of Exercise per Session: 60 min  Stress: No Stress Concern Present (02/11/2023)   [employer] of Occupational Health - Occupational Stress Questionnaire    Feeling of Stress : Not at all  Social Connections: Moderately Integrated (02/11/2023)   Social Connection and Isolation Panel [NHANES]    Frequency of Communication with Friends and Family: Once a week    Frequency of Social Gatherings with Friends and Family: Once a week    Attends Religious Services: More than 4 times per year    Active Member of [employer] or Organizations: Yes    Attends Engineer, structural: More than 4 times per year    Marital Status: Married     Review of Systems  Constitutional:  Positive for fatigue. Negative for appetite change and unexpected weight change.  HENT:  Negative for congestion and sinus pressure.   Respiratory:  Negative for cough, chest tightness and shortness of breath.   Cardiovascular:  Negative for chest pain, palpitations and leg swelling.  Gastrointestinal:  Negative for abdominal pain, diarrhea, nausea and vomiting.  Genitourinary:  Negative for difficulty urinating and dysuria.  Musculoskeletal:  Negative for joint swelling and myalgias.  Skin:  Negative for color change and rash.  Neurological:  Negative for dizziness and headaches.  Psychiatric/Behavioral:  Negative for agitation and dysphoric mood.        Objective:     BP 126/72   Pulse 78   Temp 97.9 F (36.6 C)   Resp 16   Ht 5\' 10"  (1.778 m)   Wt 171 lb  12.8 oz (77.9 kg)   SpO2 98%   BMI 24.65 kg/m  Wt Readings from Last 3 Encounters:  02/15/23 171 lb 12.8 oz (77.9 kg)  01/18/23 171 lb (77.6 kg)  01/04/23 171 lb 8.3 oz (77.8 kg)    Physical Exam Vitals reviewed.  Constitutional:      General: She is not in acute distress.    Appearance: Normal appearance.  HENT:     Head: Normocephalic and atraumatic.     Right Ear: External ear normal.  Left Ear: External ear normal.  Eyes:     General: No scleral icterus.       Right eye: No discharge.        Left eye: No discharge.     Conjunctiva/sclera: Conjunctivae normal.  Neck:     Thyroid: No thyromegaly.  Cardiovascular:     Rate and Rhythm: Normal rate and regular rhythm.  Pulmonary:     Effort: No respiratory distress.     Breath sounds: Normal breath sounds. No wheezing.  Abdominal:     General: Bowel sounds are normal.     Palpations: Abdomen is soft.     Tenderness: There is no abdominal tenderness.  Musculoskeletal:        General: No swelling or tenderness.     Cervical back: Neck supple. No tenderness.  Lymphadenopathy:     Cervical: No cervical adenopathy.  Skin:    Findings: No erythema or rash.  Neurological:     Mental Status: She is alert.  Psychiatric:        Mood and Affect: Mood normal.        Behavior: Behavior normal.      Outpatient Encounter Medications as of 02/15/2023  Medication Sig   aspirin 81 MG EC tablet Take 1 tablet by mouth daily.   calcium-vitamin D (OSCAL-500) 500-400 MG-UNIT tablet Take 1 tablet by mouth daily.    levothyroxine (SYNTHROID) 50 MCG tablet Take 50 mcg by mouth daily.   LUMIGAN 0.01 % SOLN SMARTSIG:In Eye(s)   Magnesium Oxide 400 (240 Mg) MG TABS    Multiple Vitamin (MULTIVITAMIN) tablet Take 1 tablet by mouth daily.   omeprazole (PRILOSEC) 20 MG capsule Take 1 capsule (20 mg total) by mouth daily.   venlafaxine XR (EFFEXOR-XR) 75 MG 24 hr capsule Take 1 capsule (75 mg total) by mouth daily.   No  facility-administered encounter medications on file as of 02/15/2023.     Lab Results  Component Value Date   WBC 4.1 02/14/2023   HGB 11.6 (L) 02/14/2023   HCT 34.9 (L) 02/14/2023   PLT 237 02/14/2023   GLUCOSE 101 (H) 06/20/2022   CHOL 216 (H) 06/20/2022   TRIG 91.0 06/20/2022   HDL 93.50 06/20/2022   LDLCALC 105 (H) 06/20/2022   ALT 16 06/20/2022   AST 24 06/20/2022   NA 140 06/20/2022   K 4.7 06/20/2022   CL 101 06/20/2022   CREATININE 0.74 06/20/2022   BUN 15 06/20/2022   CO2 32 06/20/2022   TSH 2.04 07/01/2021   INR 0.9 10/24/2018   HGBA1C 5.9 06/20/2022       Assessment & Plan:  Hyperglycemia Assessment & Plan: Low carb diet and exercise.  Follow met b and a1c.   Orders: -     Hepatic function panel; Future -     Hemoglobin A1c; Future -     Basic metabolic panel; Future  Screening cholesterol level -     Lipid panel; Future  Meningioma [facility]) Assessment & Plan:  Had f/u with Dr Adriana Simas 02/06/23 - f/u cavernous sinus and clinoid meningioma. Elected continued surveillance and f/u MRI in one year.    Hypothyroidism, unspecified type Assessment & Plan: On synthroid.  Follow tsh.    Gastroesophageal reflux disease without esophagitis Assessment & Plan: On omeprazole.  Continue.      Ductal carcinoma in situ (DCIS) of right breast Assessment & Plan: . Recent abnormal mammogram. 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 Has seen Dr Dwain Sarna. S/p  lumpectomy 01/04/23. Evaluated at cancer center.  Receiving XRT.  Doing well. Some fatigue, but overall doing well.       Dale [place]ire, MD

## 2023-02-16 ENCOUNTER — Ambulatory Visit
Admission: RE | Admit: 2023-02-16 | Discharge: 2023-02-16 | Disposition: A | Discharge status: Home / Self Care | Payer: Medicare HMO | Source: Ambulatory Visit | Attending: Radiation Oncology | Admitting: Radiation Oncology

## 2023-02-16 ENCOUNTER — Other Ambulatory Visit: Payer: Self-pay

## 2023-02-16 DIAGNOSIS — Z510 Encounter for antineoplastic radiation therapy: Secondary | ICD-10-CM | POA: Diagnosis not present

## 2023-02-16 DIAGNOSIS — Z170 Estrogen receptor positive status [ER+]: Secondary | ICD-10-CM | POA: Diagnosis not present

## 2023-02-16 DIAGNOSIS — D0511 Intraductal carcinoma in situ of right breast: Secondary | ICD-10-CM | POA: Diagnosis not present

## 2023-02-16 LAB — RAD ONC ARIA SESSION SUMMARY
Course Elapsed Days: 9
Plan Fractions Treated to Date: 8
Plan Prescribed Dose Per Fraction: 2.66 Gy
Plan Total Fractions Prescribed: 16
Plan Total Prescribed Dose: 42.56 Gy
Reference Point Dosage Given to Date: 21.28 Gy
Reference Point Session Dosage Given: 2.66 Gy
Session Number: 8

## 2023-02-17 ENCOUNTER — Encounter: Payer: Self-pay | Admitting: Internal Medicine

## 2023-02-17 NOTE — Assessment & Plan Note (Signed)
On synthroid.  Follow tsh.   

## 2023-02-17 NOTE — Assessment & Plan Note (Signed)
.   Recent abnormal mammogram. 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 Has seen Dr Dwain Sarna. S/p lumpectomy 01/04/23. Evaluated at cancer center.  Receiving XRT.  Doing well. Some fatigue, but overall doing well.

## 2023-02-17 NOTE — Assessment & Plan Note (Signed)
Had f/u with Dr Adriana Simas 02/06/23 - f/u cavernous sinus and clinoid meningioma. Elected continued surveillance and f/u MRI in one year.

## 2023-02-17 NOTE — Assessment & Plan Note (Signed)
On omeprazole.  Continue.

## 2023-02-17 NOTE — Assessment & Plan Note (Signed)
Low carb diet and exercise.  Follow met b and a1c.   

## 2023-02-19 ENCOUNTER — Other Ambulatory Visit: Payer: Self-pay

## 2023-02-19 ENCOUNTER — Ambulatory Visit
Admission: RE | Admit: 2023-02-19 | Discharge: 2023-02-19 | Disposition: A | Discharge status: Home / Self Care | Payer: Medicare HMO | Source: Ambulatory Visit | Attending: Radiation Oncology | Admitting: Radiation Oncology

## 2023-02-19 DIAGNOSIS — D0511 Intraductal carcinoma in situ of right breast: Secondary | ICD-10-CM | POA: Diagnosis not present

## 2023-02-19 DIAGNOSIS — Z170 Estrogen receptor positive status [ER+]: Secondary | ICD-10-CM | POA: Diagnosis not present

## 2023-02-19 DIAGNOSIS — Z510 Encounter for antineoplastic radiation therapy: Secondary | ICD-10-CM | POA: Diagnosis not present

## 2023-02-19 LAB — RAD ONC ARIA SESSION SUMMARY
Course Elapsed Days: 12
Plan Fractions Treated to Date: 9
Plan Prescribed Dose Per Fraction: 2.66 Gy
Plan Total Fractions Prescribed: 16
Plan Total Prescribed Dose: 42.56 Gy
Reference Point Dosage Given to Date: 23.94 Gy
Reference Point Session Dosage Given: 2.66 Gy
Session Number: 9

## 2023-02-20 ENCOUNTER — Other Ambulatory Visit: Payer: Self-pay

## 2023-02-20 ENCOUNTER — Ambulatory Visit
Admission: RE | Admit: 2023-02-20 | Discharge: 2023-02-20 | Disposition: A | Discharge status: Home / Self Care | Payer: Medicare HMO | Source: Ambulatory Visit | Attending: Radiation Oncology | Admitting: Radiation Oncology

## 2023-02-20 DIAGNOSIS — Z510 Encounter for antineoplastic radiation therapy: Secondary | ICD-10-CM | POA: Diagnosis not present

## 2023-02-20 DIAGNOSIS — D0511 Intraductal carcinoma in situ of right breast: Secondary | ICD-10-CM | POA: Diagnosis not present

## 2023-02-20 DIAGNOSIS — Z170 Estrogen receptor positive status [ER+]: Secondary | ICD-10-CM | POA: Diagnosis not present

## 2023-02-20 LAB — RAD ONC ARIA SESSION SUMMARY
Course Elapsed Days: 13
Plan Fractions Treated to Date: 10
Plan Prescribed Dose Per Fraction: 2.66 Gy
Plan Total Fractions Prescribed: 16
Plan Total Prescribed Dose: 42.56 Gy
Reference Point Dosage Given to Date: 26.6 Gy
Reference Point Session Dosage Given: 2.66 Gy
Session Number: 10

## 2023-02-21 ENCOUNTER — Ambulatory Visit
Admission: RE | Admit: 2023-02-21 | Discharge: 2023-02-21 | Disposition: A | Discharge status: Home / Self Care | Payer: Medicare HMO | Source: Ambulatory Visit | Attending: Radiation Oncology | Admitting: Radiation Oncology

## 2023-02-21 ENCOUNTER — Other Ambulatory Visit: Payer: Self-pay

## 2023-02-21 DIAGNOSIS — Z510 Encounter for antineoplastic radiation therapy: Secondary | ICD-10-CM | POA: Diagnosis not present

## 2023-02-21 DIAGNOSIS — Z170 Estrogen receptor positive status [ER+]: Secondary | ICD-10-CM | POA: Diagnosis not present

## 2023-02-21 DIAGNOSIS — D0511 Intraductal carcinoma in situ of right breast: Secondary | ICD-10-CM | POA: Diagnosis not present

## 2023-02-21 LAB — RAD ONC ARIA SESSION SUMMARY
Course Elapsed Days: 14
Plan Fractions Treated to Date: 11
Plan Prescribed Dose Per Fraction: 2.66 Gy
Plan Total Fractions Prescribed: 16
Plan Total Prescribed Dose: 42.56 Gy
Reference Point Dosage Given to Date: 29.26 Gy
Reference Point Session Dosage Given: 2.66 Gy
Session Number: 11

## 2023-02-22 ENCOUNTER — Other Ambulatory Visit: Payer: Self-pay

## 2023-02-22 ENCOUNTER — Ambulatory Visit
Admission: RE | Admit: 2023-02-22 | Discharge: 2023-02-22 | Disposition: A | Discharge status: Home / Self Care | Payer: Medicare HMO | Source: Ambulatory Visit | Attending: Radiation Oncology | Admitting: Radiation Oncology

## 2023-02-22 DIAGNOSIS — Z170 Estrogen receptor positive status [ER+]: Secondary | ICD-10-CM | POA: Diagnosis not present

## 2023-02-22 DIAGNOSIS — Z510 Encounter for antineoplastic radiation therapy: Secondary | ICD-10-CM | POA: Diagnosis not present

## 2023-02-22 DIAGNOSIS — D0511 Intraductal carcinoma in situ of right breast: Secondary | ICD-10-CM | POA: Diagnosis not present

## 2023-02-22 LAB — RAD ONC ARIA SESSION SUMMARY
Course Elapsed Days: 15
Plan Fractions Treated to Date: 12
Plan Prescribed Dose Per Fraction: 2.66 Gy
Plan Total Fractions Prescribed: 16
Plan Total Prescribed Dose: 42.56 Gy
Reference Point Dosage Given to Date: 31.92 Gy
Reference Point Session Dosage Given: 2.66 Gy
Session Number: 12

## 2023-02-23 ENCOUNTER — Ambulatory Visit
Admission: RE | Admit: 2023-02-23 | Discharge: 2023-02-23 | Disposition: A | Discharge status: Home / Self Care | Payer: Medicare HMO | Source: Ambulatory Visit | Attending: Radiation Oncology | Admitting: Radiation Oncology

## 2023-02-23 ENCOUNTER — Other Ambulatory Visit: Payer: Self-pay

## 2023-02-23 DIAGNOSIS — D0511 Intraductal carcinoma in situ of right breast: Secondary | ICD-10-CM | POA: Diagnosis not present

## 2023-02-23 DIAGNOSIS — Z510 Encounter for antineoplastic radiation therapy: Secondary | ICD-10-CM | POA: Diagnosis not present

## 2023-02-23 DIAGNOSIS — Z170 Estrogen receptor positive status [ER+]: Secondary | ICD-10-CM | POA: Diagnosis not present

## 2023-02-23 LAB — RAD ONC ARIA SESSION SUMMARY
Course Elapsed Days: 16
Plan Fractions Treated to Date: 13
Plan Prescribed Dose Per Fraction: 2.66 Gy
Plan Total Fractions Prescribed: 16
Plan Total Prescribed Dose: 42.56 Gy
Reference Point Dosage Given to Date: 34.58 Gy
Reference Point Session Dosage Given: 2.66 Gy
Session Number: 13

## 2023-02-26 ENCOUNTER — Other Ambulatory Visit: Payer: Self-pay

## 2023-02-26 ENCOUNTER — Ambulatory Visit
Admission: RE | Admit: 2023-02-26 | Discharge: 2023-02-26 | Disposition: A | Discharge status: Home / Self Care | Payer: Medicare HMO | Source: Ambulatory Visit | Attending: Radiation Oncology | Admitting: Radiation Oncology

## 2023-02-26 DIAGNOSIS — Z510 Encounter for antineoplastic radiation therapy: Secondary | ICD-10-CM | POA: Diagnosis not present

## 2023-02-26 DIAGNOSIS — D0511 Intraductal carcinoma in situ of right breast: Secondary | ICD-10-CM | POA: Diagnosis not present

## 2023-02-26 DIAGNOSIS — Z170 Estrogen receptor positive status [ER+]: Secondary | ICD-10-CM | POA: Diagnosis not present

## 2023-02-26 LAB — RAD ONC ARIA SESSION SUMMARY
Course Elapsed Days: 19
Plan Fractions Treated to Date: 14
Plan Prescribed Dose Per Fraction: 2.66 Gy
Plan Total Fractions Prescribed: 16
Plan Total Prescribed Dose: 42.56 Gy
Reference Point Dosage Given to Date: 37.24 Gy
Reference Point Session Dosage Given: 2.66 Gy
Session Number: 14

## 2023-02-27 ENCOUNTER — Ambulatory Visit
Admission: RE | Admit: 2023-02-27 | Discharge: 2023-02-27 | Disposition: A | Discharge status: Home / Self Care | Payer: Medicare HMO | Source: Ambulatory Visit | Attending: Radiation Oncology | Admitting: Radiation Oncology

## 2023-02-27 ENCOUNTER — Other Ambulatory Visit: Payer: Self-pay

## 2023-02-27 DIAGNOSIS — D0511 Intraductal carcinoma in situ of right breast: Secondary | ICD-10-CM | POA: Diagnosis not present

## 2023-02-27 DIAGNOSIS — Z510 Encounter for antineoplastic radiation therapy: Secondary | ICD-10-CM | POA: Diagnosis not present

## 2023-02-27 DIAGNOSIS — Z170 Estrogen receptor positive status [ER+]: Secondary | ICD-10-CM | POA: Diagnosis not present

## 2023-02-27 LAB — RAD ONC ARIA SESSION SUMMARY
Course Elapsed Days: 20
Plan Fractions Treated to Date: 15
Plan Prescribed Dose Per Fraction: 2.66 Gy
Plan Total Fractions Prescribed: 16
Plan Total Prescribed Dose: 42.56 Gy
Reference Point Dosage Given to Date: 39.9 Gy
Reference Point Session Dosage Given: 2.66 Gy
Session Number: 15

## 2023-02-28 ENCOUNTER — Inpatient Hospital Stay: Payer: Medicare HMO

## 2023-02-28 ENCOUNTER — Ambulatory Visit: Admission: RE | Admit: 2023-02-28 | Payer: Medicare HMO | Source: Ambulatory Visit

## 2023-02-28 ENCOUNTER — Ambulatory Visit
Admission: RE | Admit: 2023-02-28 | Discharge: 2023-02-28 | Disposition: A | Discharge status: Home / Self Care | Payer: Medicare HMO | Source: Ambulatory Visit | Attending: Radiation Oncology | Admitting: Radiation Oncology

## 2023-02-28 ENCOUNTER — Other Ambulatory Visit: Payer: Self-pay

## 2023-02-28 DIAGNOSIS — Z170 Estrogen receptor positive status [ER+]: Secondary | ICD-10-CM | POA: Diagnosis not present

## 2023-02-28 DIAGNOSIS — Z510 Encounter for antineoplastic radiation therapy: Secondary | ICD-10-CM | POA: Diagnosis not present

## 2023-02-28 DIAGNOSIS — D0511 Intraductal carcinoma in situ of right breast: Secondary | ICD-10-CM

## 2023-02-28 LAB — RAD ONC ARIA SESSION SUMMARY
Course Elapsed Days: 21
Plan Fractions Treated to Date: 16
Plan Prescribed Dose Per Fraction: 2.66 Gy
Plan Total Fractions Prescribed: 16
Plan Total Prescribed Dose: 42.56 Gy
Reference Point Dosage Given to Date: 42.56 Gy
Reference Point Session Dosage Given: 2.66 Gy
Session Number: 16

## 2023-02-28 LAB — CBC (CANCER CENTER ONLY)
HCT: 34.5 % — ABNORMAL LOW (ref 36.0–46.0)
Hemoglobin: 11.4 g/dL — ABNORMAL LOW (ref 12.0–15.0)
MCH: 30.5 pg (ref 26.0–34.0)
MCHC: 33 g/dL (ref 30.0–36.0)
MCV: 92.2 fL (ref 80.0–100.0)
Platelet Count: 209 10*3/uL (ref 150–400)
RBC: 3.74 MIL/uL — ABNORMAL LOW (ref 3.87–5.11)
RDW: 13.2 % (ref 11.5–15.5)
WBC Count: 4.3 10*3/uL (ref 4.0–10.5)
nRBC: 0 % (ref 0.0–0.2)

## 2023-03-01 ENCOUNTER — Ambulatory Visit
Admission: RE | Admit: 2023-03-01 | Discharge: 2023-03-01 | Disposition: A | Discharge status: Home / Self Care | Payer: Medicare HMO | Source: Ambulatory Visit | Attending: Radiation Oncology | Admitting: Radiation Oncology

## 2023-03-01 ENCOUNTER — Other Ambulatory Visit: Payer: Self-pay

## 2023-03-01 DIAGNOSIS — D0511 Intraductal carcinoma in situ of right breast: Secondary | ICD-10-CM | POA: Diagnosis not present

## 2023-03-01 DIAGNOSIS — Z510 Encounter for antineoplastic radiation therapy: Secondary | ICD-10-CM | POA: Diagnosis not present

## 2023-03-01 LAB — RAD ONC ARIA SESSION SUMMARY
Course Elapsed Days: 22
Plan Fractions Treated to Date: 1
Plan Prescribed Dose Per Fraction: 2 Gy
Plan Total Fractions Prescribed: 8
Plan Total Prescribed Dose: 16 Gy
Reference Point Dosage Given to Date: 2 Gy
Reference Point Session Dosage Given: 2 Gy
Session Number: 17

## 2023-03-02 ENCOUNTER — Ambulatory Visit
Admission: RE | Admit: 2023-03-02 | Discharge: 2023-03-02 | Disposition: A | Discharge status: Home / Self Care | Payer: Medicare HMO | Source: Ambulatory Visit | Attending: Radiation Oncology | Admitting: Radiation Oncology

## 2023-03-02 ENCOUNTER — Other Ambulatory Visit: Payer: Self-pay

## 2023-03-02 ENCOUNTER — Other Ambulatory Visit (INDEPENDENT_AMBULATORY_CARE_PROVIDER_SITE_OTHER): Payer: Medicare HMO

## 2023-03-02 DIAGNOSIS — Z13220 Encounter for screening for lipoid disorders: Secondary | ICD-10-CM | POA: Diagnosis not present

## 2023-03-02 DIAGNOSIS — D0511 Intraductal carcinoma in situ of right breast: Secondary | ICD-10-CM | POA: Diagnosis not present

## 2023-03-02 DIAGNOSIS — R739 Hyperglycemia, unspecified: Secondary | ICD-10-CM | POA: Diagnosis not present

## 2023-03-02 DIAGNOSIS — Z510 Encounter for antineoplastic radiation therapy: Secondary | ICD-10-CM | POA: Diagnosis not present

## 2023-03-02 LAB — RAD ONC ARIA SESSION SUMMARY
Course Elapsed Days: 23
Plan Fractions Treated to Date: 2
Plan Prescribed Dose Per Fraction: 2 Gy
Plan Total Fractions Prescribed: 8
Plan Total Prescribed Dose: 16 Gy
Reference Point Dosage Given to Date: 4 Gy
Reference Point Session Dosage Given: 2 Gy
Session Number: 18

## 2023-03-02 LAB — BASIC METABOLIC PANEL
BUN: 14 mg/dL (ref 6–23)
CO2: 31 meq/L (ref 19–32)
Calcium: 9.6 mg/dL (ref 8.4–10.5)
Chloride: 97 meq/L (ref 96–112)
Creatinine, Ser: 0.83 mg/dL (ref 0.40–1.20)
GFR: 71.54 mL/min (ref >60.00)
Glucose, Bld: 102 mg/dL — ABNORMAL HIGH (ref 70–99)
Potassium: 4.7 meq/L (ref 3.5–5.1)
Sodium: 135 meq/L (ref 135–145)

## 2023-03-02 LAB — HEMOGLOBIN A1C: Hgb A1c MFr Bld: 5.9 % (ref 4.6–6.5)

## 2023-03-02 LAB — HEPATIC FUNCTION PANEL
ALT: 16 U/L (ref 0–35)
AST: 23 U/L (ref 0–37)
Albumin: 4.5 g/dL (ref 3.5–5.2)
Alkaline Phosphatase: 113 U/L (ref 39–117)
Bilirubin, Direct: 0.1 mg/dL (ref 0.0–0.3)
Total Bilirubin: 0.6 mg/dL (ref 0.2–1.2)
Total Protein: 7 g/dL (ref 6.0–8.3)

## 2023-03-02 LAB — LIPID PANEL
Cholesterol: 225 mg/dL — ABNORMAL HIGH (ref 0–200)
HDL: 83.7 mg/dL (ref >39.00)
LDL Cholesterol: 123 mg/dL — ABNORMAL HIGH (ref 0–99)
NonHDL: 141.19
Total CHOL/HDL Ratio: 3
Triglycerides: 90 mg/dL (ref 0.0–149.0)
VLDL: 18 mg/dL (ref 0.0–40.0)

## 2023-03-05 ENCOUNTER — Other Ambulatory Visit: Payer: Self-pay

## 2023-03-05 ENCOUNTER — Ambulatory Visit
Admission: RE | Admit: 2023-03-05 | Discharge: 2023-03-05 | Disposition: A | Discharge status: Home / Self Care | Payer: Medicare HMO | Source: Ambulatory Visit | Attending: Radiation Oncology | Admitting: Radiation Oncology

## 2023-03-05 DIAGNOSIS — Z510 Encounter for antineoplastic radiation therapy: Secondary | ICD-10-CM | POA: Diagnosis not present

## 2023-03-05 DIAGNOSIS — D0511 Intraductal carcinoma in situ of right breast: Secondary | ICD-10-CM | POA: Diagnosis not present

## 2023-03-05 LAB — RAD ONC ARIA SESSION SUMMARY
Course Elapsed Days: 26
Plan Fractions Treated to Date: 3
Plan Prescribed Dose Per Fraction: 2 Gy
Plan Total Fractions Prescribed: 8
Plan Total Prescribed Dose: 16 Gy
Reference Point Dosage Given to Date: 6 Gy
Reference Point Session Dosage Given: 2 Gy
Session Number: 19

## 2023-03-06 ENCOUNTER — Other Ambulatory Visit: Payer: Self-pay

## 2023-03-06 ENCOUNTER — Ambulatory Visit
Admission: RE | Admit: 2023-03-06 | Discharge: 2023-03-06 | Disposition: A | Discharge status: Home / Self Care | Payer: Medicare HMO | Source: Ambulatory Visit | Attending: Radiation Oncology | Admitting: Radiation Oncology

## 2023-03-06 DIAGNOSIS — Z510 Encounter for antineoplastic radiation therapy: Secondary | ICD-10-CM | POA: Diagnosis not present

## 2023-03-06 DIAGNOSIS — Z170 Estrogen receptor positive status [ER+]: Secondary | ICD-10-CM | POA: Diagnosis not present

## 2023-03-06 DIAGNOSIS — D0511 Intraductal carcinoma in situ of right breast: Secondary | ICD-10-CM | POA: Diagnosis not present

## 2023-03-06 DIAGNOSIS — E7800 Pure hypercholesterolemia, unspecified: Secondary | ICD-10-CM

## 2023-03-06 LAB — RAD ONC ARIA SESSION SUMMARY
Course Elapsed Days: 27
Plan Fractions Treated to Date: 4
Plan Prescribed Dose Per Fraction: 2 Gy
Plan Total Fractions Prescribed: 8
Plan Total Prescribed Dose: 16 Gy
Reference Point Dosage Given to Date: 8 Gy
Reference Point Session Dosage Given: 2 Gy
Session Number: 20

## 2023-03-06 MED ORDER — ROSUVASTATIN CALCIUM 10 MG PO TABS: 10.0000 mg | ORAL_TABLET | Freq: Every day | ORAL | 0 refills | Status: DC

## 2023-03-07 ENCOUNTER — Ambulatory Visit
Admission: RE | Admit: 2023-03-07 | Discharge: 2023-03-07 | Disposition: A | Discharge status: Home / Self Care | Payer: Medicare HMO | Source: Ambulatory Visit | Attending: Radiation Oncology | Admitting: Radiation Oncology

## 2023-03-07 ENCOUNTER — Other Ambulatory Visit: Payer: Self-pay

## 2023-03-07 DIAGNOSIS — Z510 Encounter for antineoplastic radiation therapy: Secondary | ICD-10-CM | POA: Diagnosis not present

## 2023-03-07 DIAGNOSIS — D0511 Intraductal carcinoma in situ of right breast: Secondary | ICD-10-CM | POA: Diagnosis not present

## 2023-03-07 LAB — RAD ONC ARIA SESSION SUMMARY
Course Elapsed Days: 28
Plan Fractions Treated to Date: 5
Plan Prescribed Dose Per Fraction: 2 Gy
Plan Total Fractions Prescribed: 8
Plan Total Prescribed Dose: 16 Gy
Reference Point Dosage Given to Date: 10 Gy
Reference Point Session Dosage Given: 2 Gy
Session Number: 21

## 2023-03-12 ENCOUNTER — Ambulatory Visit
Admission: RE | Admit: 2023-03-12 | Discharge: 2023-03-12 | Disposition: A | Discharge status: Home / Self Care | Payer: Medicare HMO | Source: Ambulatory Visit | Attending: Radiation Oncology | Admitting: Radiation Oncology

## 2023-03-12 ENCOUNTER — Other Ambulatory Visit: Payer: Self-pay

## 2023-03-12 DIAGNOSIS — D0511 Intraductal carcinoma in situ of right breast: Secondary | ICD-10-CM | POA: Insufficient documentation

## 2023-03-12 DIAGNOSIS — Z510 Encounter for antineoplastic radiation therapy: Secondary | ICD-10-CM | POA: Insufficient documentation

## 2023-03-12 LAB — RAD ONC ARIA SESSION SUMMARY
Course Elapsed Days: 33
Plan Fractions Treated to Date: 6
Plan Prescribed Dose Per Fraction: 2 Gy
Plan Total Fractions Prescribed: 8
Plan Total Prescribed Dose: 16 Gy
Reference Point Dosage Given to Date: 12 Gy
Reference Point Session Dosage Given: 2 Gy
Session Number: 22

## 2023-03-13 ENCOUNTER — Encounter: Payer: Self-pay | Admitting: Oncology

## 2023-03-13 ENCOUNTER — Other Ambulatory Visit: Payer: Self-pay

## 2023-03-13 ENCOUNTER — Ambulatory Visit
Admission: RE | Admit: 2023-03-13 | Discharge: 2023-03-13 | Disposition: A | Discharge status: Home / Self Care | Payer: Medicare HMO | Source: Ambulatory Visit | Attending: Radiation Oncology | Admitting: Radiation Oncology

## 2023-03-13 ENCOUNTER — Ambulatory Visit: Payer: Medicare HMO | Admitting: Oncology

## 2023-03-13 ENCOUNTER — Inpatient Hospital Stay: Payer: Medicare HMO | Attending: Oncology | Admitting: Oncology

## 2023-03-13 VITALS — BP 140/91 | HR 74 | Temp 97.6°F | Resp 20 | Wt 171.3 lb

## 2023-03-13 DIAGNOSIS — D0511 Intraductal carcinoma in situ of right breast: Secondary | ICD-10-CM

## 2023-03-13 DIAGNOSIS — Z510 Encounter for antineoplastic radiation therapy: Secondary | ICD-10-CM | POA: Diagnosis not present

## 2023-03-13 LAB — RAD ONC ARIA SESSION SUMMARY
Course Elapsed Days: 34
Plan Fractions Treated to Date: 7
Plan Prescribed Dose Per Fraction: 2 Gy
Plan Total Fractions Prescribed: 8
Plan Total Prescribed Dose: 16 Gy
Reference Point Dosage Given to Date: 14 Gy
Reference Point Session Dosage Given: 2 Gy
Session Number: 23

## 2023-03-13 MED ORDER — TAMOXIFEN CITRATE 20 MG PO TABS: 20.0000 mg | ORAL_TABLET | Freq: Every day | ORAL | 3 refills | Status: DC

## 2023-03-13 NOTE — Progress Notes (Signed)
[facility]  [phone] [phone]  ID: Sarah Perry OB: September 17, 1950  MR#: [id number]  VQQ#:[id number]  Patient Care Team: Dale Sheridan, MD as PCP - General (Internal Medicine) Hulen Luster, RN as Oncology Nurse Navigator  CHIEF COMPLAINT: DCIS right breast.  INTERVAL HISTORY: Patient returns to clinic today for further evaluation and initiation of tamoxifen.  She will finish her adjuvant XRT tomorrow.  She tolerated her treatments well only with some mild skin irritation.  She currently feels well and is asymptomatic.  She has no neurologic complaints.  She denies any recent fevers or illnesses.  She has a good appetite and denies weight loss.  She has no chest pain, shortness of breath, cough, or hemoptysis.  She denies any nausea, vomiting, constipation, or diarrhea.  She has no urinary complaints.  Patient offers no further specific complaints today.  REVIEW OF SYSTEMS:   Review of Systems  Constitutional: Negative.  Negative for fever, malaise/fatigue and weight loss.  Respiratory: Negative.  Negative for cough, hemoptysis and shortness of breath.   Cardiovascular: Negative.  Negative for chest pain and leg swelling.  Gastrointestinal: Negative.  Negative for abdominal pain.  Genitourinary: Negative.  Negative for dysuria.  Musculoskeletal: Negative.  Negative for back pain.  Neurological: Negative.  Negative for dizziness, focal weakness, weakness and headaches.  Psychiatric/Behavioral: Negative.  The patient is not nervous/anxious.     As per HPI. Otherwise, a complete review of systems is negative.  PAST MEDICAL HISTORY: Past Medical History:  Diagnosis Date   Basal cell carcinoma 12/08/2014   L spinal mid back    Basal cell carcinoma 12/08/2014   R spinal upper back    Basal cell carcinoma 12/08/2014   L upper sternum    Basal cell carcinoma 02/11/2018   L med pretibial    Basal cell carcinoma 02/11/2018   L lower pretibial     Basal cell carcinoma 09/17/2018   R shoulder    Basal cell carcinoma 09/17/2018   L lat pretibial    Basal cell carcinoma 02/18/2019   R parietal scalp    Basal cell carcinoma 02/28/2019   Central nasal tip    Basal cell carcinoma 09/23/2019   R mid chin    Basal cell carcinoma 09/23/2019   R clavicle    Basal cell carcinoma 10/14/2020   left superior medial forehead, Moh's 02/07/21   Basal cell carcinoma 10/14/2020   right medial cheek, Moh's 02/07/21   Basal cell carcinoma 11/08/2021   L nasal ala, Mohs 01/09/2022   BCC (basal cell carcinoma of skin) 10/14/2020   L preauricular, exc 12/27/20   BCC (basal cell carcinoma of skin) 06/06/2022   left popliteal, edc   BCC (basal cell carcinoma of skin) 06/06/2022   left popliteal, edc   Degenerative arthritis    hips, knees   GERD (gastroesophageal reflux disease)    Varicella zoster 01/2008    PAST SURGICAL HISTORY: Past Surgical History:  Procedure Laterality Date   BASAL CELL CARCINOMA EXCISION     BREAST BIOPSY Right 11/29/2022   Right Stereo Bx, X clip- path pending   BREAST BIOPSY Right 11/29/2022   MM RT BREAST BX W LOC DEV 1ST LESION IMAGE BX SPEC STEREO GUIDE 11/29/2022 [facility]   BREAST BIOPSY  01/03/2023   MM RT RADIOACTIVE SEED LOC MAMMO GUIDE 01/03/2023 [facility]   BREAST LUMPECTOMY WITH RADIOACTIVE SEED LOCALIZATION Right 01/04/2023   Procedure: RIGHT BREAST SEED GUIDED LUMPECTOMY;  Surgeon: Emelia Loron,  MD;  Location: [facility];  Service: General;  Laterality: Right;  LMA   DILATION AND CURETTAGE OF UTERUS     TONSILECTOMY/ADENOIDECTOMY WITH MYRINGOTOMY      FAMILY HISTORY: Family History  Problem Relation Age of Onset   Ovarian cancer Mother    Hypercholesterolemia Mother    Dementia Father    Prostate cancer Father    Melanoma Sister    Breast cancer Sister 35   Pancreatic cancer Maternal Aunt    Breast cancer Paternal Aunt     ADVANCED DIRECTIVES (Y/N):   N  HEALTH MAINTENANCE: Social History   Tobacco Use   Smoking status: Never   Smokeless tobacco: Never  Vaping Use   Vaping status: Never Used  Substance Use Topics   Alcohol use: Yes    Alcohol/week: 0.0 standard drinks of alcohol    Comment: glass of wine daily   Drug use: No     Colonoscopy:  PAP:  Bone density:  Lipid panel:  Allergies  Allergen Reactions   Penicillins Other (See Comments)    Unsure from childhood     Current Outpatient Medications  Medication Sig Dispense Refill   aspirin 81 MG EC tablet Take 1 tablet by mouth daily.     calcium-vitamin D (OSCAL-500) 500-400 MG-UNIT tablet Take 1 tablet by mouth daily.      levothyroxine (SYNTHROID) 50 MCG tablet Take 50 mcg by mouth daily.     LUMIGAN 0.01 % SOLN SMARTSIG:In Eye(s)     Magnesium Oxide 400 (240 Mg) MG TABS      Multiple Vitamin (MULTIVITAMIN) tablet Take 1 tablet by mouth daily.     omeprazole (PRILOSEC) 20 MG capsule Take 1 capsule (20 mg total) by mouth daily. 90 capsule 3   rosuvastatin (CRESTOR) 10 MG tablet Take 1 tablet (10 mg total) by mouth daily. 90 tablet 0   venlafaxine XR (EFFEXOR-XR) 75 MG 24 hr capsule Take 1 capsule (75 mg total) by mouth daily. 90 capsule 1   No current facility-administered medications for this visit.    OBJECTIVE: Vitals:   03/13/23 1325  BP: (!) 140/91  Pulse: 74  Resp: 20  Temp: 97.6 F (36.4 C)  SpO2: 100%     Body mass index is 24.58 kg/m.    ECOG FS:0 - Asymptomatic  General: Well-developed, well-nourished, no acute distress. Eyes: Pink conjunctiva, anicteric sclera. HEENT: Normocephalic, moist mucous membranes. Breasts: Exam deferred today. Lungs: No audible wheezing or coughing. Heart: Regular rate and rhythm. Abdomen: Soft, nontender, no obvious distention. Musculoskeletal: No edema, cyanosis, or clubbing. Neuro: Alert, answering all questions appropriately. Cranial nerves grossly intact. Skin: No rashes or petechiae noted. Psych: Normal  affect.  LAB RESULTS:  Lab Results  Component Value Date   NA 135 03/02/2023   K 4.7 03/02/2023   CL 97 03/02/2023   CO2 31 03/02/2023   GLUCOSE 102 (H) 03/02/2023   BUN 14 03/02/2023   CREATININE 0.83 03/02/2023   CALCIUM 9.6 03/02/2023   PROT 7.0 03/02/2023   ALBUMIN 4.5 03/02/2023   AST 23 03/02/2023   ALT 16 03/02/2023   ALKPHOS 113 03/02/2023   BILITOT 0.6 03/02/2023   GFRNONAA >60 03/22/2019   GFRAA >60 03/22/2019    Lab Results  Component Value Date   WBC 4.3 02/28/2023   NEUTROABS 5.4 12/20/2021   HGB 11.4 (L) 02/28/2023   HCT 34.5 (L) 02/28/2023   MCV 92.2 02/28/2023   PLT 209 02/28/2023     STUDIES: No  results found.  ASSESSMENT: DCIS right breast, ER positive, PR negative.  PLAN:    DCIS right breast: Patient underwent lumpectomy on January 04, 2023 confirming diagnosis.  She did not require adjuvant chemotherapy.  Patient will finish adjuvant XRT on March 14, 2023.  She was given a prescription for tamoxifen today which is recommended to take for a total of 5 years completing in December 2029.  No further interventions are needed.  Return to clinic in 3 months for further evaluation.  I spent a total of 20 minutes reviewing chart data, face-to-face evaluation with the patient, counseling and coordination of care as detailed above.     Patient expressed understanding and was in agreement with this plan. She also understands that She can call clinic at any time with any questions, concerns, or complaints.    Cancer Staging  Ductal carcinoma in situ (DCIS) of right breast Staging form: Breast, AJCC 8th Edition - Clinical stage from 12/05/2022: Stage 0 (cTis (DCIS), cN0, cM0) - Signed by Jeralyn Ruths, MD on 12/05/2022 Stage prefix: Initial diagnosis   Jeralyn Ruths, MD   03/13/2023 1:32 PM

## 2023-03-14 ENCOUNTER — Inpatient Hospital Stay: Payer: Medicare HMO

## 2023-03-14 ENCOUNTER — Ambulatory Visit
Admission: RE | Admit: 2023-03-14 | Discharge: 2023-03-14 | Disposition: A | Discharge status: Home / Self Care | Payer: Medicare HMO | Source: Ambulatory Visit | Attending: Radiation Oncology | Admitting: Radiation Oncology

## 2023-03-14 ENCOUNTER — Encounter: Payer: Self-pay | Admitting: *Deleted

## 2023-03-14 ENCOUNTER — Other Ambulatory Visit: Payer: Self-pay

## 2023-03-14 DIAGNOSIS — D0511 Intraductal carcinoma in situ of right breast: Secondary | ICD-10-CM | POA: Diagnosis not present

## 2023-03-14 DIAGNOSIS — Z510 Encounter for antineoplastic radiation therapy: Secondary | ICD-10-CM | POA: Diagnosis not present

## 2023-03-14 DIAGNOSIS — Z170 Estrogen receptor positive status [ER+]: Secondary | ICD-10-CM | POA: Diagnosis not present

## 2023-03-14 LAB — RAD ONC ARIA SESSION SUMMARY
Course Elapsed Days: 35
Plan Fractions Treated to Date: 8
Plan Prescribed Dose Per Fraction: 2 Gy
Plan Total Fractions Prescribed: 8
Plan Total Prescribed Dose: 16 Gy
Reference Point Dosage Given to Date: 16 Gy
Reference Point Session Dosage Given: 2 Gy
Session Number: 24

## 2023-03-14 LAB — CBC (CANCER CENTER ONLY)
HCT: 35.5 % — ABNORMAL LOW (ref 36.0–46.0)
Hemoglobin: 11.7 g/dL — ABNORMAL LOW (ref 12.0–15.0)
MCH: 30.8 pg (ref 26.0–34.0)
MCHC: 33 g/dL (ref 30.0–36.0)
MCV: 93.4 fL (ref 80.0–100.0)
Platelet Count: 207 10*3/uL (ref 150–400)
RBC: 3.8 MIL/uL — ABNORMAL LOW (ref 3.87–5.11)
RDW: 13.5 % (ref 11.5–15.5)
WBC Count: 4.3 10*3/uL (ref 4.0–10.5)
nRBC: 0 % (ref 0.0–0.2)

## 2023-03-15 NOTE — Radiation Completion Notes (Signed)
Patient Name: Sarah Perry, Sarah Perry MRN: [id number] Date of Birth: [birth year] Referring Physician: Dale Bullhead, M.D. Date of Service: 2023-03-15 Radiation Oncologist: Carmina Miller, M.D. [facility]                             RADIATION ONCOLOGY END OF TREATMENT NOTE     Diagnosis: D05.11 Intraductal carcinoma in situ of right breast Staging on 2022-12-05: Ductal carcinoma in situ (DCIS) of right breast T=cTis (DCIS), N=cN0, M=cM0 Intent: Curative     HPI: Patient is a 72 year old female who presents with abnormal mammogram of the right breast showing calcifications warranting further investigation.  Calcifications were in the upper outer quadrant of the right breast at anterior depth.  Calcifications spanned 1.9 x 1.2 cm.  She underwent targeted biopsy which was positive for ductal carcinoma in situ.  She went on to have a wide local excision showing ER positive ductal carcinoma in situ 1.8 cm intermediate to high-grade with necrosis and calcifications.  There is no evidence of invasive cancer.  All margins were clear except for 0.5 mm lateral margin.  She had multiple areas of reexcision at the time of lumpectomy of the superior medial and posterior margins.  She has done well postoperatively but was still sore with ecchymosis present in the resection site.  She otherwise specifically denies cough or bone pain.      ==========DELIVERED PLANS==========  First Treatment Date: 2023-02-07 Last Treatment Date: 2023-03-14   Plan Name: Breast_R Site: Breast, Right Technique: 3D Mode: Photon Dose Per Fraction: 2.66 Gy Prescribed Dose (Delivered / Prescribed): 42.56 Gy / 42.56 Gy Prescribed Fxs (Delivered / Prescribed): 16 / 16   Plan Name: Breast_Rt_Bst Site: Breast, Right Technique: 3D Mode: Photon Dose Per Fraction: 2 Gy Prescribed Dose (Delivered / Prescribed): 16 Gy / 16 Gy Prescribed Fxs (Delivered / Prescribed): 8 / 8     ==========ON TREATMENT VISIT  DATES========== 2023-02-13, 2023-02-20, 2023-02-27, 2023-03-06, 2023-03-13     ==========UPCOMING VISITS========== 06/11/2023 [facility] ONC EST PT 15 Jeralyn Ruths, MD  04/19/2023 [facility] RAD ONCOLOGY FOLLOW UP 30 Chrystal, Glenn, MD  04/17/2023 [facility]  03/20/2023 [facility] AWV, SEQUENTIAL [facility] ANNUAL WELLNESS VISIT        ==========APPENDIX - ON TREATMENT VISIT NOTES==========   See weekly On Treatment Notes in Epic for details in the Media tab (listed as Progress notes on the On Treatment Visit Dates listed above).

## 2023-03-20 ENCOUNTER — Ambulatory Visit (INDEPENDENT_AMBULATORY_CARE_PROVIDER_SITE_OTHER): Payer: Medicare HMO | Admitting: *Deleted

## 2023-03-20 VITALS — Ht 70.0 in | Wt 170.0 lb

## 2023-03-20 DIAGNOSIS — Z0000 Encounter for general adult medical examination without abnormal findings: Secondary | ICD-10-CM

## 2023-03-20 NOTE — Progress Notes (Signed)
Subjective:   Sarah Perry is a 72 y.o. female who presents for Medicare Annual (Subsequent) preventive examination.  Visit Complete: Virtual I connected with  Rhya Hackworth Thomann on 03/20/23 by a audio enabled telemedicine application and verified that I am speaking with the correct person using two identifiers.  Patient Location: Home  Provider Location: Office/Clinic  I discussed the limitations of evaluation and management by telemedicine. The patient expressed understanding and agreed to proceed.  Vital Signs: Because this visit was a virtual/telehealth visit, some criteria may be missing or patient reported. Any vitals not documented were not able to be obtained and vitals that have been documented are patient reported.  Patient Medicare AWV questionnaire was completed by the patient on 03/19/23; I have confirmed that all information answered by patient is correct and no changes since this date.  Cardiac Risk Factors include: advanced age (>4men, >84 women);dyslipidemia;Other (see comment), Risk factor comments: recently started cholesterol medication     Objective:    Today's Vitals   03/20/23 1240  Weight: 170 lb (77.1 kg)  Height: 5\' 10"  (1.778 m)   Body mass index is 24.39 kg/m.     03/20/2023   12:51 PM 03/13/2023    1:25 PM 01/18/2023   10:29 AM 01/04/2023    9:38 AM 12/05/2022    3:09 PM 03/15/2021    9:07 AM 03/12/2020    9:10 AM  Advanced Directives  Does Patient Have a Medical Advance Directive? No No No No No No No  Would patient like information on creating a medical advance directive? No - Patient declined No - Patient declined No - Patient declined No - Patient declined No - Patient declined No - Patient declined No - Patient declined    Current Medications (verified) Outpatient Encounter Medications as of 03/20/2023  Medication Sig   aspirin 81 MG EC tablet Take 1 tablet by mouth daily.   calcium-vitamin D (OSCAL-500) 500-400 MG-UNIT tablet Take 1 tablet  by mouth daily.    LUMIGAN 0.01 % SOLN SMARTSIG:In Eye(s)   Magnesium Oxide 400 (240 Mg) MG TABS    Multiple Vitamin (MULTIVITAMIN) tablet Take 1 tablet by mouth daily.   omeprazole (PRILOSEC) 20 MG capsule Take 1 capsule (20 mg total) by mouth daily.   rosuvastatin (CRESTOR) 10 MG tablet Take 1 tablet (10 mg total) by mouth daily.   tamoxifen (NOLVADEX) 20 MG tablet Take 1 tablet (20 mg total) by mouth daily.   venlafaxine XR (EFFEXOR-XR) 75 MG 24 hr capsule Take 1 capsule (75 mg total) by mouth daily.   levothyroxine (SYNTHROID) 50 MCG tablet Take 50 mcg by mouth daily.   No facility-administered encounter medications on file as of 03/20/2023.    Allergies (verified) Penicillins   History: Past Medical History:  Diagnosis Date   Basal cell carcinoma 12/08/2014   L spinal mid back    Basal cell carcinoma 12/08/2014   R spinal upper back    Basal cell carcinoma 12/08/2014   L upper sternum    Basal cell carcinoma 02/11/2018   L med pretibial    Basal cell carcinoma 02/11/2018   L lower pretibial    Basal cell carcinoma 09/17/2018   R shoulder    Basal cell carcinoma 09/17/2018   L lat pretibial    Basal cell carcinoma 02/18/2019   R parietal scalp    Basal cell carcinoma 02/28/2019   Central nasal tip    Basal cell carcinoma 09/23/2019   R mid chin  Basal cell carcinoma 09/23/2019   R clavicle    Basal cell carcinoma 10/14/2020   left superior medial forehead, Moh's 02/07/21   Basal cell carcinoma 10/14/2020   right medial cheek, Moh's 02/07/21   Basal cell carcinoma 11/08/2021   L nasal ala, Mohs 01/09/2022   BCC (basal cell carcinoma of skin) 10/14/2020   L preauricular, exc 12/27/20   BCC (basal cell carcinoma of skin) 06/06/2022   left popliteal, edc   BCC (basal cell carcinoma of skin) 06/06/2022   left popliteal, edc   Degenerative arthritis    hips, knees   GERD (gastroesophageal reflux disease)    Varicella zoster 01/2008   Past Surgical History:   Procedure Laterality Date   BASAL CELL CARCINOMA EXCISION     BREAST BIOPSY Right 11/29/2022   Right Stereo Bx, X clip- path pending   BREAST BIOPSY Right 11/29/2022   MM RT BREAST BX W LOC DEV 1ST LESION IMAGE BX SPEC STEREO GUIDE 11/29/2022 [facility]   BREAST BIOPSY  01/03/2023   MM RT RADIOACTIVE SEED LOC MAMMO GUIDE 01/03/2023 [facility]   BREAST LUMPECTOMY WITH RADIOACTIVE SEED LOCALIZATION Right 01/04/2023   Procedure: RIGHT BREAST SEED GUIDED LUMPECTOMY;  Surgeon: Emelia Loron, MD;  Location: [facility];  Service: General;  Laterality: Right;  LMA   DILATION AND CURETTAGE OF UTERUS     TONSILECTOMY/ADENOIDECTOMY WITH MYRINGOTOMY     Family History  Problem Relation Age of Onset   Ovarian cancer Mother    Hypercholesterolemia Mother    Dementia Father    Prostate cancer Father    Melanoma Sister    Breast cancer Sister 49   Pancreatic cancer Maternal Aunt    Breast cancer Paternal Aunt    Social History   Socioeconomic History   Marital status: Married    Spouse name: Not on file   Number of children: 2   Years of education: Not on file   Highest education level: Bachelor's degree (e.g., BA, AB, BS)  Occupational History   Not on file  Tobacco Use   Smoking status: Never   Smokeless tobacco: Never  Vaping Use   Vaping status: Never Used  Substance and Sexual Activity   Alcohol use: Yes    Alcohol/week: 0.0 standard drinks of alcohol    Comment: glass of wine daily   Drug use: No   Sexual activity: Yes    Birth control/protection: None  Other Topics Concern   Not on file  Social History Narrative   Married   Social Determinants of Health   Financial Resource Strain: Low Risk  (03/19/2023)   Overall Financial Resource Strain (CARDIA)    Difficulty of Paying Living Expenses: Not hard at all  Food Insecurity: No Food Insecurity (03/19/2023)   Hunger Vital Sign    Worried About Running Out of Food in the Last Year: Never true     Ran Out of Food in the Last Year: Never true  Transportation Needs: No Transportation Needs (03/19/2023)   PRAPARE - Administrator, Civil Service (Medical): No    Lack of Transportation (Non-Medical): No  Physical Activity: Sufficiently Active (03/19/2023)   Exercise Vital Sign    Days of Exercise per Week: 5 days    Minutes of Exercise per Session: 30 min  Stress: No Stress Concern Present (03/19/2023)   [employer] of Occupational Health - Occupational Stress Questionnaire    Feeling of Stress : Only a little  Social Connections: Socially Integrated (  03/19/2023)   Social Connection and Isolation Panel [NHANES]    Frequency of Communication with Friends and Family: Three times a week    Frequency of Social Gatherings with Friends and Family: Twice a week    Attends Religious Services: More than 4 times per year    Active Member of [employer] or Organizations: Yes    Attends Engineer, structural: More than 4 times per year    Marital Status: Married    Tobacco Counseling Counseling given: Not Answered   Clinical Intake:  Pre-visit preparation completed: Yes  Pain : No/denies pain     BMI - recorded: 24.39 Nutritional Status: BMI of 19-24  Normal Nutritional Risks: None Diabetes: No  How often do you need to have someone help you when you read instructions, pamphlets, or other written materials from your doctor or pharmacy?: 1 - Never  Interpreter Needed?: No  Information entered by :: R, Agnieszka Newhouse LPN   Activities of Daily Living    03/19/2023    5:59 PM 01/04/2023    9:44 AM  In your present state of health, do you have any difficulty performing the following activities:  Hearing? 0 0  Vision? 0 0  Comment glasses   Difficulty concentrating or making decisions? 0 0  Walking or climbing stairs? 0   Dressing or bathing? 0   Doing errands, shopping? 0   Preparing Food and eating ? N   Using the Toilet? N   In the past six months, have you  accidently leaked urine? Y   Do you have problems with loss of bowel control? N   Managing your Medications? N   Managing your Finances? N   Housekeeping or managing your Housekeeping? N     Patient Care Team: Dale Paukaa, MD as PCP - General (Internal Medicine) Hulen Luster, RN as Oncology Nurse Navigator  Indicate any recent Medical Services you may have received from other than [facility] providers in the past year (date may be approximate).     Assessment:   This is a routine wellness examination for Sarah Perry.  Hearing/Vision screen Hearing Screening - Comments:: No issues Vision Screening - Comments:: glasses   Goals Addressed             This Visit's Progress    Patient Stated       Wants to continue to exercise and eat better       Depression Screen    03/20/2023   12:46 PM 06/20/2022    8:41 AM 03/16/2022    1:43 PM 03/09/2022    1:02 PM 12/20/2021    9:02 AM 03/15/2021    9:08 AM 03/12/2020    9:08 AM  PHQ 2/9 Scores  PHQ - 2 Score 0 0 0 0 0 0 0  PHQ- 9 Score 2  0 2       Fall Risk    03/19/2023    5:59 PM 06/20/2022    8:41 AM 03/16/2022    1:37 PM 03/09/2022    1:02 PM 12/20/2021    9:02 AM  Fall Risk   Falls in the past year? 0 1 1 0 0  Number falls in past yr: 0 0 0 0 0  Injury with Fall? 0 0  0 0  Risk for fall due to : No Fall Risks History of fall(s) No Fall Risks No Fall Risks No Fall Risks  Follow up Falls prevention discussed;Falls evaluation completed Falls evaluation completed Falls evaluation completed Falls  evaluation completed Falls evaluation completed    MEDICARE RISK AT HOME: Medicare Risk at Home Any stairs in or around the home?: Yes If so, are there any without handrails?: No Home free of loose throw rugs in walkways, pet beds, electrical cords, etc?: Yes Adequate lighting in your home to reduce risk of falls?: Yes Life alert?: No Use of a cane, walker or w/c?: No Grab bars in the bathroom?: No Shower chair or bench in shower?:  No Elevated toilet seat or a handicapped toilet?: Yes   Cognitive Function:        03/20/2023   12:52 PM 03/16/2022    2:07 PM 03/11/2019    9:14 AM  6CIT Screen  What Year? 0 points 0 points 0 points  What month? 0 points 0 points 0 points  What time? 0 points 0 points 0 points  Count back from 20 0 points 0 points 0 points  Months in reverse 0 points 0 points 0 points  Repeat phrase 0 points 0 points 0 points  Total Score 0 points 0 points 0 points    Immunizations Immunization History  Administered Date(s) Administered   Influenza Split 01/06/2013, 12/30/2013, 01/05/2015   Influenza, High Dose Seasonal PF 11/28/2018   Influenza,inj,Quad PF,6+ Mos 12/12/2017   Influenza-Unspecified 01/01/2020, 12/16/2020, 11/18/2021, 11/17/2022   Moderna Covid-19 Fall Seasonal Vaccine 26yrs & older 10/08/2022   Moderna SARS-COV2 Booster Vaccination 10/08/2022   PFIZER(Purple Top)SARS-COV-2 Vaccination 05/18/2019, 06/10/2019, 01/13/2020, 07/09/2020   PNEUMOCOCCAL CONJUGATE-20 10/07/2022   Pfizer Covid-19 Vaccine Bivalent Booster 68yrs & up 01/04/2021, 10/20/2021, 02/24/2022   Pneumococcal Conjugate-13 12/31/2017   Pneumococcal Polysaccharide-23 02/03/2019   Pneumococcal-Unspecified 10/07/2022   Respiratory Syncytial Virus Vaccine,Recomb Aduvanted(Arexvy) 01/24/2022   Tdap 03/11/2020   Zoster Recombinant(Shingrix) 01/02/2018, 03/20/2018   Zoster, Live 03/12/2013    TDAP status: Up to date  Flu Vaccine status: Up to date  Pneumococcal vaccine status: Up to date  Covid-19 vaccine status: Information provided on how to obtain vaccines.   Qualifies for Shingles Vaccine? Yes   Zostavax completed Yes   Shingrix Completed?: Yes  Screening Tests Health Maintenance  Topic Date Due   COVID-19 Vaccine (8 - 2023-24 season) 12/10/2022   Medicare Annual Wellness (AWV)  03/17/2023   Fecal DNA (Cologuard)  02/15/2024 (Originally 01/27/2023)   MAMMOGRAM  10/29/2024   DTaP/Tdap/Td (2 - Td or  Tdap) 03/11/2030   Pneumonia Vaccine 54+ Years old  Completed   INFLUENZA VACCINE  Completed   DEXA SCAN  Completed   Hepatitis C Screening  Completed   Zoster Vaccines- Shingrix  Completed   HPV VACCINES  Aged Out   Colonoscopy  Discontinued    Health Maintenance  Health Maintenance Due  Topic Date Due   COVID-19 Vaccine (8 - 2023-24 season) 12/10/2022   Medicare Annual Wellness (AWV)  03/17/2023    Colorectal cancer screening: Type of screening: Cologuard. Completed 01/2020. Repeat every 3 years Patient has been having radiation and will discuss with PCP at next visit  Mammogram status: Completed 10/2022. Repeat every year  Bone Density status: Completed 10/2021. Results reflect: Bone density results: OSTEOPENIA. Repeat every 2 years.  Lung Cancer Screening: (Low Dose CT Chest recommended if Age 46-80 years, 20 pack-year currently smoking OR have quit w/in 15years.) does not qualify.   Additional Screening:  Hepatitis C Screening: does qualify; Completed 04/2016  Vision Screening: Recommended annual ophthalmology exams for early detection of glaucoma and other disorders of the eye. Is the patient up to date with their  annual eye exam?  Yes  Who is the provider or what is the name of the office in which the patient attends annual eye exams? [facility] If pt is not established with a provider, would they like to be referred to a provider to establish care? No .   Dental Screening: Recommended annual dental exams for proper oral hygiene    Community Resource Referral / Chronic Care Management: CRR required this visit?  No   CCM required this visit?  No     Plan:     I have personally reviewed and noted the following in the patient's chart:   Medical and social history Use of alcohol, tobacco or illicit drugs  Current medications and supplements including opioid prescriptions. Patient is not currently taking opioid prescriptions. Functional ability and  status Nutritional status Physical activity Advanced directives List of other physicians Hospitalizations, surgeries, and ER visits in previous 12 months Vitals Screenings to include cognitive, depression, and falls Referrals and appointments  In addition, I have reviewed and discussed with patient certain preventive protocols, quality metrics, and best practice recommendations. A written personalized care plan for preventive services as well as general preventive health recommendations were provided to patient.     Sydell Axon, LPN   [id number]/12/[id number]   After Visit Summary: (MyChart) Due to this being a telephonic visit, the after visit summary with patients personalized plan was offered to patient via MyChart   Nurse Notes: None

## 2023-03-20 NOTE — Patient Instructions (Signed)
Sarah Perry , Thank you for taking time to come for your Medicare Wellness Visit. I appreciate your ongoing commitment to your health goals. Please review the following plan we discussed and let me know if I can assist you in the future.   Referrals/Orders/Follow-Ups/Clinician Recommendations: None  This is a list of the screening recommended for you and due dates:  Health Maintenance  Topic Date Due   COVID-19 Vaccine (8 - 2023-24 season) 12/10/2022   Cologuard (Stool DNA test)  02/15/2024*   Medicare Annual Wellness Visit  03/19/2024   Mammogram  10/29/2024   DTaP/Tdap/Td vaccine (2 - Td or Tdap) 03/11/2030   Pneumonia Vaccine  Completed   Flu Shot  Completed   DEXA scan (bone density measurement)  Completed   Hepatitis C Screening  Completed   Zoster (Shingles) Vaccine  Completed   HPV Vaccine  Aged Out   Colon Cancer Screening  Discontinued  *Topic was postponed. The date shown is not the original due date.    Advanced directives: (Declined) Advance directive discussed with you today. Even though you declined this today, please call our office should you change your mind, and we can give you the proper paperwork for you to fill out.  Next Medicare Annual Wellness Visit scheduled for next year: Yes 03/21/24 @ 11:30

## 2023-04-17 ENCOUNTER — Other Ambulatory Visit (INDEPENDENT_AMBULATORY_CARE_PROVIDER_SITE_OTHER): Payer: Medicare HMO

## 2023-04-17 DIAGNOSIS — E7800 Pure hypercholesterolemia, unspecified: Secondary | ICD-10-CM | POA: Diagnosis not present

## 2023-04-17 LAB — HEPATIC FUNCTION PANEL
ALT: 15 U/L (ref 0–35)
AST: 26 U/L (ref 0–37)
Albumin: 4.3 g/dL (ref 3.5–5.2)
Alkaline Phosphatase: 95 U/L (ref 39–117)
Bilirubin, Direct: 0.1 mg/dL (ref 0.0–0.3)
Total Bilirubin: 0.4 mg/dL (ref 0.2–1.2)
Total Protein: 6.7 g/dL (ref 6.0–8.3)

## 2023-04-19 ENCOUNTER — Ambulatory Visit: Payer: Medicare HMO | Admitting: Radiation Oncology

## 2023-04-26 ENCOUNTER — Ambulatory Visit
Admission: RE | Admit: 2023-04-26 | Discharge: 2023-04-26 | Disposition: A | Discharge status: Home / Self Care | Payer: Medicare HMO | Source: Ambulatory Visit | Attending: Radiation Oncology | Admitting: Radiation Oncology

## 2023-04-26 ENCOUNTER — Encounter: Payer: Self-pay | Admitting: Radiation Oncology

## 2023-04-26 VITALS — BP 119/76 | HR 82 | Temp 97.0°F | Resp 16 | Wt 171.0 lb

## 2023-04-26 DIAGNOSIS — D0511 Intraductal carcinoma in situ of right breast: Secondary | ICD-10-CM | POA: Diagnosis not present

## 2023-04-26 NOTE — Progress Notes (Signed)
Radiation Oncology Follow up Note  Name: Sarah Perry   Date:   04/26/2023 MRN:  [id number] DOB: 11-Jun-1950    This 73 y.o. female presents to the clinic today for 1 month follow-up status post whole breast radiation to her right breast for ER positive ductal carcinoma site to.  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  Seen today in routine follow-up she is doing well specifically denies breast tenderness cough or bone pain..  She currently is on tamoxifen without side effect.  COMPLICATIONS OF TREATMENT: none  FOLLOW UP COMPLIANCE: keeps appointments   PHYSICAL EXAM:  BP 119/76   Pulse 82   Temp (!) 97 F (36.1 C)   Resp 16   Wt 171 lb (77.6 kg)   BMI 24.54 kg/m  Lungs are clear to A&P cardiac examination essentially unremarkable with regular rate and rhythm. No dominant mass or nodularity is noted in either breast in 2 positions examined. Incision is well-healed. No axillary or supraclavicular adenopathy is appreciated. Cosmetic result is excellent.  Well-developed well-nourished patient in NAD. HEENT reveals PERLA, EOMI, discs not visualized.  Oral cavity is clear. No oral mucosal lesions are identified. Neck is clear without evidence of cervical or supraclavicular adenopathy. Lungs are clear to A&P. Cardiac examination is essentially unremarkable with regular rate and rhythm without murmur rub or thrill. Abdomen is benign with no organomegaly or masses noted. Motor sensory and DTR levels are equal and symmetric in the upper and lower extremities. Cranial nerves II through XII are grossly intact. Proprioception is intact. No peripheral adenopathy or edema is identified. No motor or sensory levels are noted. Crude visual fields are within normal range.  RADIOLOGY RESULTS: No current films to review  PLAN: Present time patient is  doing well 1 month out from whole breast radiation and pleased with her overall progress and low side effect profile.  Of asked to see her back in 6 months for follow-up.  She continues on tamoxifen without side effect.  Patient knows to call with any concerns.  I would like to take this opportunity to thank you for allowing me to participate in the care of your patient.Carmina Miller, MD

## 2023-05-03 ENCOUNTER — Encounter: Payer: Self-pay | Admitting: Internal Medicine

## 2023-06-03 ENCOUNTER — Other Ambulatory Visit: Payer: Self-pay | Admitting: Internal Medicine

## 2023-06-11 ENCOUNTER — Encounter: Payer: Self-pay | Admitting: Oncology

## 2023-06-11 ENCOUNTER — Inpatient Hospital Stay: Payer: Medicare HMO | Attending: Oncology | Admitting: Oncology

## 2023-06-11 VITALS — BP 112/72 | Temp 98.2°F | Resp 16 | Ht 70.0 in | Wt 170.0 lb

## 2023-06-11 DIAGNOSIS — D0511 Intraductal carcinoma in situ of right breast: Secondary | ICD-10-CM | POA: Diagnosis not present

## 2023-06-11 DIAGNOSIS — R232 Flushing: Secondary | ICD-10-CM | POA: Insufficient documentation

## 2023-06-11 DIAGNOSIS — Z170 Estrogen receptor positive status [ER+]: Secondary | ICD-10-CM | POA: Diagnosis not present

## 2023-06-11 DIAGNOSIS — Z79810 Long term (current) use of selective estrogen receptor modulators (SERMs): Secondary | ICD-10-CM | POA: Diagnosis not present

## 2023-06-11 NOTE — Progress Notes (Signed)
 Survivorship Care Plan visit completed.  Treatment summary reviewed and given to patient.  ASCO answers booklet reviewed and given to patient.  CARE program and Cancer Transitions discussed with patient along with other resources cancer center offers to patients and caregivers.  Patient verbalized understanding.

## 2023-06-11 NOTE — Progress Notes (Signed)
 [facility]  [phone] [phone]  ID: Sarah Perry OB: 1950/11/25  MR#: [id number]  AOZ#:[id number]  Patient Care Team: Dale Mazomanie, MD as PCP - General (Internal Medicine) Hulen Luster, RN as Oncology Nurse Navigator Orlie Dakin, Tollie Pizza, MD as Consulting Physician (Oncology) Carmina Miller, MD as Consulting Physician (Radiation Oncology) Emelia Loron, MD as Consulting Physician (General Surgery)  CHIEF COMPLAINT: DCIS right breast.  INTERVAL HISTORY: Patient returns to clinic today for routine 66-month evaluation and to assess her toleration of tamoxifen.  She has increased hot flashes, but these do not affect her day-to-day activity.  She otherwise feels well and is asymptomatic.  She has no neurologic complaints.  She denies any recent fevers or illnesses.  She has a good appetite and denies weight loss.  She has no chest pain, shortness of breath, cough, or hemoptysis.  She denies any nausea, vomiting, constipation, or diarrhea.  She has no urinary complaints.  Patient offers no further specific complaints today.  REVIEW OF SYSTEMS:   Review of Systems  Constitutional: Negative.  Negative for fever, malaise/fatigue and weight loss.  Respiratory: Negative.  Negative for cough, hemoptysis and shortness of breath.   Cardiovascular: Negative.  Negative for chest pain and leg swelling.  Gastrointestinal: Negative.  Negative for abdominal pain.  Genitourinary: Negative.  Negative for dysuria.  Musculoskeletal: Negative.  Negative for back pain.  Neurological:  Positive for sensory change. Negative for dizziness, focal weakness, weakness and headaches.  Psychiatric/Behavioral: Negative.  The patient is not nervous/anxious.     As per HPI. Otherwise, a complete review of systems is negative.  PAST MEDICAL HISTORY: Past Medical History:  Diagnosis Date   Basal cell carcinoma 12/08/2014   L spinal mid back    Basal cell carcinoma  12/08/2014   R spinal upper back    Basal cell carcinoma 12/08/2014   L upper sternum    Basal cell carcinoma 02/11/2018   L med pretibial    Basal cell carcinoma 02/11/2018   L lower pretibial    Basal cell carcinoma 09/17/2018   R shoulder    Basal cell carcinoma 09/17/2018   L lat pretibial    Basal cell carcinoma 02/18/2019   R parietal scalp    Basal cell carcinoma 02/28/2019   Central nasal tip    Basal cell carcinoma 09/23/2019   R mid chin    Basal cell carcinoma 09/23/2019   R clavicle    Basal cell carcinoma 10/14/2020   left superior medial forehead, Moh's 02/07/21   Basal cell carcinoma 10/14/2020   right medial cheek, Moh's 02/07/21   Basal cell carcinoma 11/08/2021   L nasal ala, Mohs 01/09/2022   BCC (basal cell carcinoma of skin) 10/14/2020   L preauricular, exc 12/27/20   BCC (basal cell carcinoma of skin) 06/06/2022   left popliteal, edc   BCC (basal cell carcinoma of skin) 06/06/2022   left popliteal, edc   Degenerative arthritis    hips, knees   GERD (gastroesophageal reflux disease)    Varicella zoster 01/2008    PAST SURGICAL HISTORY: Past Surgical History:  Procedure Laterality Date   BASAL CELL CARCINOMA EXCISION     BREAST BIOPSY Right 11/29/2022   Right Stereo Bx, X clip- path pending   BREAST BIOPSY Right 11/29/2022   MM RT BREAST BX W LOC DEV 1ST LESION IMAGE BX SPEC STEREO GUIDE 11/29/2022 [facility]   BREAST BIOPSY  01/03/2023   MM RT RADIOACTIVE SEED LOC MAMMO GUIDE  01/03/2023 [facility]   BREAST LUMPECTOMY WITH RADIOACTIVE SEED LOCALIZATION Right 01/04/2023   Procedure: RIGHT BREAST SEED GUIDED LUMPECTOMY;  Surgeon: Emelia Loron, MD;  Location: [facility];  Service: General;  Laterality: Right;  LMA   DILATION AND CURETTAGE OF UTERUS     TONSILECTOMY/ADENOIDECTOMY WITH MYRINGOTOMY      FAMILY HISTORY: Family History  Problem Relation Age of Onset   Ovarian cancer Mother    Hypercholesterolemia  Mother    Dementia Father    Prostate cancer Father    Melanoma Sister    Breast cancer Sister 27   Pancreatic cancer Maternal Aunt    Breast cancer Paternal Aunt     ADVANCED DIRECTIVES (Y/N):  N  HEALTH MAINTENANCE: Social History   Tobacco Use   Smoking status: Never   Smokeless tobacco: Never  Vaping Use   Vaping status: Never Used  Substance Use Topics   Alcohol use: Yes    Alcohol/week: 0.0 standard drinks of alcohol    Comment: glass of wine daily   Drug use: No     Colonoscopy:  PAP:  Bone density:  Lipid panel:  Allergies  Allergen Reactions   Penicillins Other (See Comments)    Unsure from childhood     Current Outpatient Medications  Medication Sig Dispense Refill   aspirin 81 MG EC tablet Take 1 tablet by mouth daily.     calcium-vitamin D (OSCAL-500) 500-400 MG-UNIT tablet Take 1 tablet by mouth daily.      levothyroxine (SYNTHROID) 50 MCG tablet Take 50 mcg by mouth daily.     LUMIGAN 0.01 % SOLN SMARTSIG:In Eye(s)     Magnesium Oxide 400 (240 Mg) MG TABS      Multiple Vitamin (MULTIVITAMIN) tablet Take 1 tablet by mouth daily.     omeprazole (PRILOSEC) 20 MG capsule Take 1 capsule (20 mg total) by mouth daily. 90 capsule 3   rosuvastatin (CRESTOR) 10 MG tablet TAKE 1 TABLET BY MOUTH DAILY 90 tablet 0   tamoxifen (NOLVADEX) 20 MG tablet Take 1 tablet (20 mg total) by mouth daily. 90 tablet 3   venlafaxine XR (EFFEXOR-XR) 75 MG 24 hr capsule Take 1 capsule (75 mg total) by mouth daily. 90 capsule 1   No current facility-administered medications for this visit.    OBJECTIVE: Vitals:   06/11/23 1410  BP: 112/72  Resp: 16  Temp: 98.2 F (36.8 C)  SpO2: 100%      Body mass index is 24.39 kg/m.    ECOG FS:0 - Asymptomatic  General: Well-developed, well-nourished, no acute distress. Eyes: Pink conjunctiva, anicteric sclera. HEENT: Normocephalic, moist mucous membranes. Lungs: No audible wheezing or coughing. Heart: Regular rate and  rhythm. Abdomen: Soft, nontender, no obvious distention. Musculoskeletal: No edema, cyanosis, or clubbing. Neuro: Alert, answering all questions appropriately. Cranial nerves grossly intact. Skin: No rashes or petechiae noted. Psych: Normal affect.  LAB RESULTS:  Lab Results  Component Value Date   NA 135 03/02/2023   K 4.7 03/02/2023   CL 97 03/02/2023   CO2 31 03/02/2023   GLUCOSE 102 (H) 03/02/2023   BUN 14 03/02/2023   CREATININE 0.83 03/02/2023   CALCIUM 9.6 03/02/2023   PROT 6.7 04/17/2023   ALBUMIN 4.3 04/17/2023   AST 26 04/17/2023   ALT 15 04/17/2023   ALKPHOS 95 04/17/2023   BILITOT 0.4 04/17/2023   GFRNONAA >60 03/22/2019   GFRAA >60 03/22/2019    Lab Results  Component Value Date   WBC 4.3  03/14/2023   NEUTROABS 5.4 12/20/2021   HGB 11.7 (L) 03/14/2023   HCT 35.5 (L) 03/14/2023   MCV 93.4 03/14/2023   PLT 207 03/14/2023     STUDIES: No results found.  ASSESSMENT: DCIS right breast, ER positive, PR negative.  PLAN:    DCIS right breast: Patient underwent lumpectomy on January 04, 2023 confirming diagnosis.  She did not require adjuvant chemotherapy.  Patient completed adjuvant XRT on March 14, 2023.  Continue tamoxifen for a total of 5 years completing treatment in December 2029.  No further intervention is needed.  Return to clinic in 6 months for routine evaluation.   Hot flashes: Monitor.     I spent a total of 20 minutes reviewing chart data, face-to-face evaluation with the patient, counseling and coordination of care as detailed above.   Patient expressed understanding and was in agreement with this plan. She also understands that She can call clinic at any time with any questions, concerns, or complaints.    Cancer Staging  Ductal carcinoma in situ (DCIS) of right breast Staging form: Breast, AJCC 8th Edition - Clinical stage from 12/05/2022: Stage 0 (cTis (DCIS), cN0, cM0) - Signed by Jeralyn Ruths, MD on 12/05/2022 Stage prefix:  Initial diagnosis   Jeralyn Ruths, MD   06/11/2023 4:27 PM

## 2023-06-22 ENCOUNTER — Ambulatory Visit (INDEPENDENT_AMBULATORY_CARE_PROVIDER_SITE_OTHER): Payer: Medicare HMO | Admitting: Internal Medicine

## 2023-06-22 ENCOUNTER — Encounter: Payer: Self-pay | Admitting: Internal Medicine

## 2023-06-22 VITALS — BP 118/76 | HR 68 | Temp 97.7°F | Ht 70.0 in | Wt 169.6 lb

## 2023-06-22 DIAGNOSIS — D0511 Intraductal carcinoma in situ of right breast: Secondary | ICD-10-CM | POA: Diagnosis not present

## 2023-06-22 DIAGNOSIS — D329 Benign neoplasm of meninges, unspecified: Secondary | ICD-10-CM | POA: Diagnosis not present

## 2023-06-22 DIAGNOSIS — R232 Flushing: Secondary | ICD-10-CM | POA: Diagnosis not present

## 2023-06-22 DIAGNOSIS — Z0000 Encounter for general adult medical examination without abnormal findings: Secondary | ICD-10-CM | POA: Diagnosis not present

## 2023-06-22 DIAGNOSIS — R739 Hyperglycemia, unspecified: Secondary | ICD-10-CM

## 2023-06-22 DIAGNOSIS — E039 Hypothyroidism, unspecified: Secondary | ICD-10-CM

## 2023-06-22 DIAGNOSIS — R252 Cramp and spasm: Secondary | ICD-10-CM | POA: Diagnosis not present

## 2023-06-22 DIAGNOSIS — Z1211 Encounter for screening for malignant neoplasm of colon: Secondary | ICD-10-CM

## 2023-06-22 DIAGNOSIS — K219 Gastro-esophageal reflux disease without esophagitis: Secondary | ICD-10-CM | POA: Diagnosis not present

## 2023-06-22 MED ORDER — ROSUVASTATIN CALCIUM 10 MG PO TABS: 10.0000 mg | ORAL_TABLET | Freq: Every day | ORAL | 0 refills | Status: DC

## 2023-06-22 MED ORDER — VENLAFAXINE HCL ER 150 MG PO CP24: 150.0000 mg | ORAL_CAPSULE | Freq: Every day | ORAL | 1 refills | Status: DC

## 2023-06-22 NOTE — Assessment & Plan Note (Addendum)
 Physical today 06/22/23.   PAP 06/2017 - negative with negative HPV. cologuard 01/2020 negative. Due cologuard.

## 2023-06-22 NOTE — Progress Notes (Signed)
 Subjective:    Patient ID: Sarah Perry, female    DOB: 09/22/1950, 73 y.o.   MRN: [id number]  Patient here for  Chief Complaint  Patient presents with   Medical Management of Chronic Issues    HPI Here for a physical exam. Seeing Dr Orlie Dakin DCIS - s/p lumpectomy 01/04/23. Completed XRT 03/14/23. Continues on tamoxifen for 5 years - to complete treatment in 03/2028. Had f/u with Dr Adriana Simas 02/06/23 - f/u cavernous sinus and clinoid meningioma. Elected continued surveillance and f/u MRI in one year. Saw endocrinology 02/08/23 - stable. Mild persistent stable hyperprolactinemia. F/u 12 months. Reports she is doing relatively well. Discussed due cologuard. Having some intermittent constipation. Discussed benefiber. Breathing stable. No chest pain or sob reported. Having some leg cramps. Taking mag oxide. Discussed magnesium glycinate - could help with sleep as well. Is having some increased hot flashes. On effexor. Discussed increasing dose.    Past Medical History:  Diagnosis Date   Basal cell carcinoma 12/08/2014   L spinal mid back    Basal cell carcinoma 12/08/2014   R spinal upper back    Basal cell carcinoma 12/08/2014   L upper sternum    Basal cell carcinoma 02/11/2018   L med pretibial    Basal cell carcinoma 02/11/2018   L lower pretibial    Basal cell carcinoma 09/17/2018   R shoulder    Basal cell carcinoma 09/17/2018   L lat pretibial    Basal cell carcinoma 02/18/2019   R parietal scalp    Basal cell carcinoma 02/28/2019   Central nasal tip    Basal cell carcinoma 09/23/2019   R mid chin    Basal cell carcinoma 09/23/2019   R clavicle    Basal cell carcinoma 10/14/2020   left superior medial forehead, Moh's 02/07/21   Basal cell carcinoma 10/14/2020   right medial cheek, Moh's 02/07/21   Basal cell carcinoma 11/08/2021   L nasal ala, Mohs 01/09/2022   BCC (basal cell carcinoma of skin) 10/14/2020   L preauricular, exc 12/27/20   BCC (basal cell carcinoma of  skin) 06/06/2022   left popliteal, edc   BCC (basal cell carcinoma of skin) 06/06/2022   left popliteal, edc   Degenerative arthritis    hips, knees   GERD (gastroesophageal reflux disease)    Varicella zoster 01/2008   Past Surgical History:  Procedure Laterality Date   BASAL CELL CARCINOMA EXCISION     BREAST BIOPSY Right 11/29/2022   Right Stereo Bx, X clip- path pending   BREAST BIOPSY Right 11/29/2022   MM RT BREAST BX W LOC DEV 1ST LESION IMAGE BX SPEC STEREO GUIDE 11/29/2022 [facility]   BREAST BIOPSY  01/03/2023   MM RT RADIOACTIVE SEED LOC MAMMO GUIDE 01/03/2023 [facility]   BREAST LUMPECTOMY WITH RADIOACTIVE SEED LOCALIZATION Right 01/04/2023   Procedure: RIGHT BREAST SEED GUIDED LUMPECTOMY;  Surgeon: Emelia Loron, MD;  Location: [facility];  Service: General;  Laterality: Right;  LMA   DILATION AND CURETTAGE OF UTERUS     TONSILECTOMY/ADENOIDECTOMY WITH MYRINGOTOMY     Family History  Problem Relation Age of Onset   Ovarian cancer Mother    Hypercholesterolemia Mother    Dementia Father    Prostate cancer Father    Melanoma Sister    Breast cancer Sister 34   Pancreatic cancer Maternal Aunt    Breast cancer Paternal Aunt    Social History   Socioeconomic History   Marital status: Married  Spouse name: Not on file   Number of children: 2   Years of education: Not on file   Highest education level: Bachelor's degree (e.g., BA, AB, BS)  Occupational History   Not on file  Tobacco Use   Smoking status: Never   Smokeless tobacco: Never  Vaping Use   Vaping status: Never Used  Substance and Sexual Activity   Alcohol use: Yes    Alcohol/week: 0.0 standard drinks of alcohol    Comment: glass of wine daily   Drug use: No   Sexual activity: Yes    Birth control/protection: None  Other Topics Concern   Not on file  Social History Narrative   Married   Social Drivers of Health   Financial Resource Strain: Low Risk   (06/18/2023)   Overall Financial Resource Strain (CARDIA)    Difficulty of Paying Living Expenses: Not hard at all  Food Insecurity: No Food Insecurity (06/18/2023)   Hunger Vital Sign    Worried About Running Out of Food in the Last Year: Never true    Ran Out of Food in the Last Year: Never true  Transportation Needs: No Transportation Needs (06/18/2023)   PRAPARE - Administrator, Civil Service (Medical): No    Lack of Transportation (Non-Medical): No  Physical Activity: Sufficiently Active (06/18/2023)   Exercise Vital Sign    Days of Exercise per Week: 5 days    Minutes of Exercise per Session: 50 min  Stress: No Stress Concern Present (06/18/2023)   [employer] of Occupational Health - Occupational Stress Questionnaire    Feeling of Stress : Not at all  Social Connections: Socially Integrated (06/18/2023)   Social Connection and Isolation Panel [NHANES]    Frequency of Communication with Friends and Family: Twice a week    Frequency of Social Gatherings with Friends and Family: Twice a week    Attends Religious Services: More than 4 times per year    Active Member of [employer] or Organizations: Yes    Attends Banker Meetings: 1 to 4 times per year    Marital Status: Married     Review of Systems  Constitutional:  Negative for appetite change and unexpected weight change.  HENT:  Negative for congestion, sinus pressure and sore throat.   Eyes:  Negative for pain and visual disturbance.  Respiratory:  Negative for cough, chest tightness and shortness of breath.   Cardiovascular:  Negative for chest pain, palpitations and leg swelling.  Gastrointestinal:  Negative for abdominal pain, diarrhea, nausea and vomiting.  Endocrine:       Hot flashes.   Genitourinary:  Negative for difficulty urinating and dysuria.  Musculoskeletal:  Negative for joint swelling and myalgias.       Leg cramps.   Skin:  Negative for color change and rash.  Neurological:   Negative for dizziness and headaches.  Hematological:  Negative for adenopathy. Does not bruise/bleed easily.  Psychiatric/Behavioral:  Negative for agitation and dysphoric mood.        Objective:     BP 118/76   Pulse 68   Temp 97.7 F (36.5 C) (Oral)   Ht 5\' 10"  (1.778 m)   Wt 169 lb 9.6 oz (76.9 kg)   SpO2 98%   BMI 24.34 kg/m  Wt Readings from Last 3 Encounters:  06/22/23 169 lb 9.6 oz (76.9 kg)  06/11/23 170 lb (77.1 kg)  04/26/23 171 lb (77.6 kg)    Physical Exam Vitals reviewed.  Constitutional:  General: She is not in acute distress.    Appearance: Normal appearance.  HENT:     Head: Normocephalic and atraumatic.     Right Ear: External ear normal.     Left Ear: External ear normal.     Mouth/Throat:     Pharynx: No oropharyngeal exudate or posterior oropharyngeal erythema.  Eyes:     General: No scleral icterus.       Right eye: No discharge.        Left eye: No discharge.     Conjunctiva/sclera: Conjunctivae normal.  Neck:     Thyroid: No thyromegaly.  Cardiovascular:     Rate and Rhythm: Normal rate and regular rhythm.  Pulmonary:     Effort: No respiratory distress.     Breath sounds: Normal breath sounds. No wheezing.  Abdominal:     General: Bowel sounds are normal.     Palpations: Abdomen is soft.     Tenderness: There is no abdominal tenderness.  Musculoskeletal:        General: No swelling or tenderness.     Cervical back: Neck supple. No tenderness.  Lymphadenopathy:     Cervical: No cervical adenopathy.  Skin:    Findings: No erythema or rash.  Neurological:     Mental Status: She is alert.  Psychiatric:        Mood and Affect: Mood normal.        Behavior: Behavior normal.         Outpatient Encounter Medications as of 06/22/2023  Medication Sig   aspirin 81 MG EC tablet Take 1 tablet by mouth daily.   calcium-vitamin D (OSCAL-500) 500-400 MG-UNIT tablet Take 1 tablet by mouth daily.    LUMIGAN 0.01 % SOLN SMARTSIG:In  Eye(s)   Magnesium Oxide 400 (240 Mg) MG TABS    Multiple Vitamin (MULTIVITAMIN) tablet Take 1 tablet by mouth daily.   omeprazole (PRILOSEC) 20 MG capsule Take 1 capsule (20 mg total) by mouth daily.   tamoxifen (NOLVADEX) 20 MG tablet Take 1 tablet (20 mg total) by mouth daily.   venlafaxine XR (EFFEXOR XR) 150 MG 24 hr capsule Take 1 capsule (150 mg total) by mouth daily with breakfast.   [DISCONTINUED] rosuvastatin (CRESTOR) 10 MG tablet TAKE 1 TABLET BY MOUTH DAILY   [DISCONTINUED] venlafaxine XR (EFFEXOR-XR) 75 MG 24 hr capsule Take 1 capsule (75 mg total) by mouth daily.   levothyroxine (SYNTHROID) 50 MCG tablet Take 50 mcg by mouth daily.   rosuvastatin (CRESTOR) 10 MG tablet Take 1 tablet (10 mg total) by mouth daily.   No facility-administered encounter medications on file as of 06/22/2023.     Lab Results  Component Value Date   WBC 4.3 03/14/2023   HGB 11.7 (L) 03/14/2023   HCT 35.5 (L) 03/14/2023   PLT 207 03/14/2023   GLUCOSE 102 (H) 03/02/2023   CHOL 225 (H) 03/02/2023   TRIG 90.0 03/02/2023   HDL 83.70 03/02/2023   LDLCALC 123 (H) 03/02/2023   ALT 15 04/17/2023   AST 26 04/17/2023   NA 135 03/02/2023   K 4.7 03/02/2023   CL 97 03/02/2023   CREATININE 0.83 03/02/2023   BUN 14 03/02/2023   CO2 31 03/02/2023   TSH 2.04 07/01/2021   INR 0.9 10/24/2018   HGBA1C 5.9 03/02/2023       Assessment & Plan:  Routine general medical examination at a health care facility  Health care maintenance Assessment & Plan: Physical today 06/22/23.   PAP 06/2017 -  negative with negative HPV. cologuard 01/2020 negative. Due cologuard.    Hypothyroidism, unspecified type Assessment & Plan: On synthroid. Follow tsh.    Hyperglycemia Assessment & Plan: Low carb diet and exercise. Follow met b and A1c.   Orders: -     Hepatic function panel; Future -     Basic metabolic panel; Future -     Hemoglobin A1c; Future -     Lipid panel; Future  Colon cancer screening -      Cologuard  Meningioma [facility]) Assessment & Plan:  Had f/u with Dr Adriana Simas 02/06/23 - f/u cavernous sinus and clinoid meningioma. Elected continued surveillance. Recommended f/u MRI one year from last.    Gastroesophageal reflux disease without esophagitis Assessment & Plan: On omeprazole. No upper symptoms reported.    Ductal carcinoma in situ (DCIS) of right breast Assessment & Plan:  Seeing Dr Orlie Dakin DCIS - s/p lumpectomy 01/04/23. Completed XRT 03/14/23. Continues on tamoxifen for 5 years - to complete treatment in 03/2028.   Hot flashes Assessment & Plan: On effexor 75mg  q day. Will increase to 150mg  q day. Follow.    Leg cramps Assessment & Plan: Magnesium glycinate.    Other orders -     Rosuvastatin Calcium; Take 1 tablet (10 mg total) by mouth daily.  Dispense: 90 tablet; Refill: 0 -     Venlafaxine HCl ER; Take 1 capsule (150 mg total) by mouth daily with breakfast.  Dispense: 90 capsule; Refill: 1     Dale Ge[place], MD

## 2023-06-22 NOTE — Patient Instructions (Signed)
 Benefiber - daily   Magnesium glycinate.

## 2023-06-23 ENCOUNTER — Encounter: Payer: Self-pay | Admitting: Internal Medicine

## 2023-06-23 DIAGNOSIS — R252 Cramp and spasm: Secondary | ICD-10-CM | POA: Insufficient documentation

## 2023-06-23 DIAGNOSIS — R232 Flushing: Secondary | ICD-10-CM | POA: Insufficient documentation

## 2023-06-23 NOTE — Assessment & Plan Note (Signed)
 On effexor 75mg  q day. Will increase to 150mg  q day. Follow.

## 2023-06-23 NOTE — Assessment & Plan Note (Signed)
 Had f/u with Dr Adriana Simas 02/06/23 - f/u cavernous sinus and clinoid meningioma. Elected continued surveillance. Recommended f/u MRI one year from last.

## 2023-06-23 NOTE — Assessment & Plan Note (Signed)
On omeprazole.  No upper symptoms reported.   

## 2023-06-23 NOTE — Assessment & Plan Note (Signed)
 Magnesium glycinate

## 2023-06-23 NOTE — Assessment & Plan Note (Signed)
 Low-carb diet and exercise.  Follow met b and A1c.

## 2023-06-23 NOTE — Assessment & Plan Note (Signed)
On synthroid.  Follow tsh.   

## 2023-06-23 NOTE — Assessment & Plan Note (Signed)
 Seeing Dr Orlie Dakin DCIS - s/p lumpectomy 01/04/23. Completed XRT 03/14/23. Continues on tamoxifen for 5 years - to complete treatment in 03/2028.

## 2023-07-01 ENCOUNTER — Encounter: Payer: Self-pay | Admitting: Internal Medicine

## 2023-07-02 ENCOUNTER — Encounter: Payer: Self-pay | Admitting: Dermatology

## 2023-07-02 ENCOUNTER — Ambulatory Visit (INDEPENDENT_AMBULATORY_CARE_PROVIDER_SITE_OTHER): Payer: Medicare HMO | Admitting: Dermatology

## 2023-07-02 DIAGNOSIS — Z85828 Personal history of other malignant neoplasm of skin: Secondary | ICD-10-CM | POA: Diagnosis not present

## 2023-07-02 DIAGNOSIS — D229 Melanocytic nevi, unspecified: Secondary | ICD-10-CM

## 2023-07-02 DIAGNOSIS — L814 Other melanin hyperpigmentation: Secondary | ICD-10-CM | POA: Diagnosis not present

## 2023-07-02 DIAGNOSIS — W908XXA Exposure to other nonionizing radiation, initial encounter: Secondary | ICD-10-CM

## 2023-07-02 DIAGNOSIS — L989 Disorder of the skin and subcutaneous tissue, unspecified: Secondary | ICD-10-CM | POA: Diagnosis not present

## 2023-07-02 DIAGNOSIS — L739 Follicular disorder, unspecified: Secondary | ICD-10-CM | POA: Diagnosis not present

## 2023-07-02 DIAGNOSIS — L820 Inflamed seborrheic keratosis: Secondary | ICD-10-CM | POA: Diagnosis not present

## 2023-07-02 DIAGNOSIS — L578 Other skin changes due to chronic exposure to nonionizing radiation: Secondary | ICD-10-CM | POA: Diagnosis not present

## 2023-07-02 DIAGNOSIS — B001 Herpesviral vesicular dermatitis: Secondary | ICD-10-CM | POA: Diagnosis not present

## 2023-07-02 DIAGNOSIS — Z1283 Encounter for screening for malignant neoplasm of skin: Secondary | ICD-10-CM | POA: Diagnosis not present

## 2023-07-02 DIAGNOSIS — D1801 Hemangioma of skin and subcutaneous tissue: Secondary | ICD-10-CM

## 2023-07-02 DIAGNOSIS — L821 Other seborrheic keratosis: Secondary | ICD-10-CM

## 2023-07-02 DIAGNOSIS — B009 Herpesviral infection, unspecified: Secondary | ICD-10-CM

## 2023-07-02 MED ORDER — VALACYCLOVIR HCL 1 G PO TABS
ORAL_TABLET | ORAL | 1 refills | Status: AC
Start: 2023-07-02 — End: ?

## 2023-07-02 NOTE — Patient Instructions (Addendum)

## 2023-07-02 NOTE — Telephone Encounter (Signed)
Fasting labs scheduled.  

## 2023-07-02 NOTE — Progress Notes (Signed)
 Follow-Up Visit   Subjective  Sarah Perry is a 73 y.o. female who presents for the following: Skin Cancer Screening and Full Body Skin Exam  The patient presents for Total-Body Skin Exam (TBSE) for skin cancer screening and mole check. The patient has spots, moles and lesions to be evaluated, some may be new or changing. Patient had radiation in November for breast cancer, she has noticed tiny dots on her right breast.  She also has flaky spots on the right arm and left upper thigh (picks at). History of multiple BCCs treated in the past.   The following portions of the chart were reviewed this encounter and updated as appropriate: medications, allergies, medical history  Review of Systems:  No other skin or systemic complaints except as noted in HPI or Assessment and Plan.  Objective  Well appearing patient in no apparent distress; mood and affect are within normal limits.  A full examination was performed including scalp, head, eyes, ears, nose, lips, neck, chest, axillae, abdomen, back, buttocks, bilateral upper extremities, bilateral lower extremities, hands, feet, fingers, toes, fingernails, and toenails. All findings within normal limits unless otherwise noted below.   Relevant physical exam findings are noted in the Assessment and Plan.  R upper elbow x 1, L ant upper thigh x 1 (2) Erythematous stuck-on, waxy papule R medial shoulder    L upper thigh     Assessment & Plan   SKIN CANCER SCREENING PERFORMED TODAY.  ACTINIC DAMAGE - Chronic condition, secondary to cumulative UV/sun exposure - diffuse scaly erythematous macules with underlying dyspigmentation - Recommend daily broad spectrum sunscreen SPF 30+ to sun-exposed areas, reapply every 2 hours as needed.  - Staying in the shade or wearing long sleeves, sun glasses (UVA+UVB protection) and wide brim hats (4-inch brim around the entire circumference of the hat) are also recommended for sun protection.  - Call for  new or changing lesions.  LENTIGINES, SEBORRHEIC KERATOSES, HEMANGIOMAS (including right medial cheek) - Benign normal skin lesions - Benign-appearing - Call for any changes  MELANOCYTIC NEVI - Tan-brown and/or pink-flesh-colored symmetric macules and papules - Benign appearing on exam today - Observation - Call clinic for new or changing moles - Recommend daily use of broad spectrum spf 30+ sunscreen to sun-exposed areas.   HISTORY OF BASAL CELL CARCINOMA OF THE SKIN L popliteal, R post ankle - 06/07/2022 L nasal ala, 2023, Mohs Multiple on face, see history - No evidence of recurrence today - Recommend regular full body skin exams - Recommend daily broad spectrum sunscreen SPF 30+ to sun-exposed areas, reapply every 2 hours as needed.  - Call if any new or changing lesions are noted between office visits  NEVUS VS LENTIGO Exam: 4 mm speckled brown macule at right lateral thigh.  Stable compared to photo 11/08/2021.  Treatment Plan: Benign appearing on exam today. Recommend observation. Call clinic for new or changing moles. Recommend daily use of broad spectrum spf 30+ sunscreen to sun-exposed areas.   FOLLICULITIS Exam: few scattered perifollicular erythematous papules and pustules at right breast.  Folliculitis occurs due to inflammation of the superficial hair follicle (pore), resulting in acne-like lesions (pus bumps). It can be infectious (bacterial, fungal) or noninfectious (shaving, tight clothing, heat/sweat, medications).  Folliculitis can be acute or chronic and recommended treatment depends on the underlying cause of folliculitis.  Treatment Plan: May use 1% hydrocortisone if flared/itchy. Benzoyl Peroxide wash in the shower. Benzoyl peroxide can cause dryness and irritation of the skin. It can  also bleach fabric. When used together with Aczone (dapsone) cream, it can stain the skin orange.  HERPESVIRAL INFECTION (COLD SORES) Exam: right upper lip with pink patch, mild  crusting. Came up several days ago, using Abreva. Pt states she gets prodrome tingling before sore erupts.  Chronic and persistent condition with duration or expected duration over one year. Condition is bothersome/symptomatic for patient. Currently flared.  Herpes Simplex Virus = Cold Sores = Fever Blisters is a chronic recurring blistering; scabbing sore-producing viral infection that is recurrent usually in the same area triggered by stress, sun/UV exposure and trauma.  It is infectious and can be spread from person to person by direct contact.  It is not curable, but is treatable with topical and oral medication.  Treatment Plan Take Valacyclovir 2 grams every 12 hours for 2 doses with a glass of water at the first sign of symptoms dsp #30 1Rf.  LENTIGO Exam: 8.0 mm speckled brown macule at right medial post shoulder. Photo taken today. Due to sun exposure Treatment Plan: Benign-appearing, observe. Recommend daily broad spectrum sunscreen SPF 30+ to sun-exposed areas, reapply every 2 hours as needed.  Call for any changes  SEBORRHEIC KERATOSIS VS LENTIGO - 5.29mm waxy speckled thin waxy papule at left upper anterior thigh. Photo taken today. - Benign-appearing - Discussed benign etiology and prognosis. - Observe - Call for any changes  INFLAMED SEBORRHEIC KERATOSIS (2) R upper elbow x 1, L ant upper thigh x 1 (2) (vs AK R upper elbow)  Symptomatic, irritating, patient would like treated. Destruction of lesion - R upper elbow x 1, L ant upper thigh x 1 (2)  Destruction method: cryotherapy   Informed consent: discussed and consent obtained   Lesion destroyed using liquid nitrogen: Yes   Region frozen until ice ball extended beyond lesion: Yes   Outcome: patient tolerated procedure well with no complications   Post-procedure details: wound care instructions given   Additional details:  Prior to procedure, discussed risks of blister formation, small wound, skin dyspigmentation, or  rare scar following cryotherapy. Recommend Vaseline ointment to treated areas while healing.  Return in about 6 months (around 01/02/2024) for sun exposed areas, Hx BCC.  ICherlyn Labella, CMA, am acting as scribe for Willeen Niece, MD .   Documentation: I have reviewed the above documentation for accuracy and completeness, and I agree with the above.  Willeen Niece, MD

## 2023-07-05 ENCOUNTER — Other Ambulatory Visit (INDEPENDENT_AMBULATORY_CARE_PROVIDER_SITE_OTHER)

## 2023-07-05 DIAGNOSIS — R739 Hyperglycemia, unspecified: Secondary | ICD-10-CM

## 2023-07-05 DIAGNOSIS — Z1211 Encounter for screening for malignant neoplasm of colon: Secondary | ICD-10-CM | POA: Diagnosis not present

## 2023-07-05 LAB — HEPATIC FUNCTION PANEL
ALT: 16 U/L (ref 0–35)
AST: 25 U/L (ref 0–37)
Albumin: 4.4 g/dL (ref 3.5–5.2)
Alkaline Phosphatase: 80 U/L (ref 39–117)
Bilirubin, Direct: 0.1 mg/dL (ref 0.0–0.3)
Total Bilirubin: 0.5 mg/dL (ref 0.2–1.2)
Total Protein: 6.9 g/dL (ref 6.0–8.3)

## 2023-07-05 LAB — LIPID PANEL
Cholesterol: 160 mg/dL (ref 0–200)
HDL: 83.4 mg/dL (ref >39.00)
LDL Cholesterol: 66 mg/dL (ref 0–99)
NonHDL: 76.74
Total CHOL/HDL Ratio: 2
Triglycerides: 56 mg/dL (ref 0.0–149.0)
VLDL: 11.2 mg/dL (ref 0.0–40.0)

## 2023-07-05 LAB — BASIC METABOLIC PANEL WITH GFR
BUN: 19 mg/dL (ref 6–23)
CO2: 32 meq/L (ref 19–32)
Calcium: 9.4 mg/dL (ref 8.4–10.5)
Chloride: 99 meq/L (ref 96–112)
Creatinine, Ser: 0.84 mg/dL (ref 0.40–1.20)
GFR: 70.35 mL/min (ref >60.00)
Glucose, Bld: 100 mg/dL — ABNORMAL HIGH (ref 70–99)
Potassium: 4.2 meq/L (ref 3.5–5.1)
Sodium: 138 meq/L (ref 135–145)

## 2023-07-05 LAB — HEMOGLOBIN A1C: Hgb A1c MFr Bld: 6 % (ref 4.6–6.5)

## 2023-07-06 ENCOUNTER — Encounter: Payer: Self-pay | Admitting: Internal Medicine

## 2023-07-09 LAB — COLOGUARD: COLOGUARD: NEGATIVE

## 2023-07-22 ENCOUNTER — Other Ambulatory Visit: Payer: Self-pay | Admitting: Internal Medicine

## 2023-08-13 DIAGNOSIS — H43813 Vitreous degeneration, bilateral: Secondary | ICD-10-CM | POA: Diagnosis not present

## 2023-08-13 DIAGNOSIS — H40053 Ocular hypertension, bilateral: Secondary | ICD-10-CM | POA: Diagnosis not present

## 2023-08-13 DIAGNOSIS — H2513 Age-related nuclear cataract, bilateral: Secondary | ICD-10-CM | POA: Diagnosis not present

## 2023-08-13 DIAGNOSIS — D320 Benign neoplasm of cerebral meninges: Secondary | ICD-10-CM | POA: Diagnosis not present

## 2023-10-16 ENCOUNTER — Telehealth: Payer: Self-pay | Admitting: Internal Medicine

## 2023-10-16 DIAGNOSIS — E039 Hypothyroidism, unspecified: Secondary | ICD-10-CM

## 2023-10-16 DIAGNOSIS — D509 Iron deficiency anemia, unspecified: Secondary | ICD-10-CM

## 2023-10-16 DIAGNOSIS — R739 Hyperglycemia, unspecified: Secondary | ICD-10-CM

## 2023-10-16 NOTE — Telephone Encounter (Signed)
 Lab orders needed

## 2023-10-17 NOTE — Telephone Encounter (Signed)
Orders placed for future labs.  

## 2023-10-24 ENCOUNTER — Ambulatory Visit
Admission: RE | Admit: 2023-10-24 | Discharge: 2023-10-24 | Disposition: A | Discharge status: Home / Self Care | Payer: Medicare HMO | Source: Ambulatory Visit | Attending: Radiation Oncology | Admitting: Radiation Oncology

## 2023-10-24 ENCOUNTER — Encounter: Payer: Self-pay | Admitting: Radiation Oncology

## 2023-10-24 VITALS — BP 135/84 | HR 76 | Temp 98.2°F | Resp 16 | Wt 161.0 lb

## 2023-10-24 DIAGNOSIS — D0511 Intraductal carcinoma in situ of right breast: Secondary | ICD-10-CM | POA: Insufficient documentation

## 2023-10-24 DIAGNOSIS — Z170 Estrogen receptor positive status [ER+]: Secondary | ICD-10-CM | POA: Insufficient documentation

## 2023-10-24 DIAGNOSIS — Z79810 Long term (current) use of selective estrogen receptor modulators (SERMs): Secondary | ICD-10-CM | POA: Insufficient documentation

## 2023-10-24 DIAGNOSIS — Z923 Personal history of irradiation: Secondary | ICD-10-CM | POA: Insufficient documentation

## 2023-10-24 NOTE — Progress Notes (Signed)
 Radiation Oncology Follow up Note  Name: Sarah Perry   Date:   10/24/2023 MRN:  [id number] DOB: 08/14/1950    This 73 y.o. female presents to the clinic today for 81-month follow-up status post whole breast radiation to her right breast for your positive ductal carcinoma and site to.  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  Seen today in routine follow-up she is doing well.  She specifically denies breast tenderness cough or bone pain.  She is currently on tamoxifen  tolerating it well without side effect..  She is being followed for meningioma with with serial MRI scans.  She has not yet had mammograms.  COMPLICATIONS OF TREATMENT: none  FOLLOW UP COMPLIANCE: keeps appointments   PHYSICAL EXAM:  BP 135/84   Pulse 76   Temp 98.2 F (36.8 C) (Tympanic)   Resp 16   Wt 161 lb (73 kg)   BMI 23.10 kg/m  Lungs are clear to A&P cardiac examination essentially unremarkable with regular rate and rhythm. No dominant mass or nodularity is noted in either breast in 2 positions examined. Incision is well-healed. No axillary or supraclavicular adenopathy is appreciated. Cosmetic result is excellent.  Well-developed well-nourished patient in NAD. HEENT reveals PERLA, EOMI, discs not visualized.  Oral cavity is clear. No oral mucosal lesions are identified. Neck is clear without evidence of cervical or supraclavicular adenopathy. Lungs are clear to A&P. Cardiac examination is essentially unremarkable with regular rate and rhythm without murmur rub or thrill. Abdomen is benign with no organomegaly or masses noted. Motor sensory and DTR levels are equal and symmetric in the upper and lower extremities. Cranial nerves II through XII are grossly intact. Proprioception is intact. No peripheral adenopathy or edema is identified. No motor or sensory levels  are noted. Crude visual fields are within normal range.  RADIOLOGY RESULTS: No current films to review  PLAN: The present time patient is doing well with no evidence of disease now at 7 months of whole breast radiation and pleased with her overall progress.  I have asked to see her back in 6 months for follow-up.  Patient knows to call with any concerns.  She continues on tamoxifen  without side effect.  I would like to take this opportunity to thank you for allowing me to participate in the care of your patient.SABRA Marcey Penton, MD

## 2023-10-30 ENCOUNTER — Other Ambulatory Visit (INDEPENDENT_AMBULATORY_CARE_PROVIDER_SITE_OTHER)

## 2023-10-30 ENCOUNTER — Ambulatory Visit: Payer: Self-pay | Admitting: Internal Medicine

## 2023-10-30 DIAGNOSIS — E039 Hypothyroidism, unspecified: Secondary | ICD-10-CM

## 2023-10-30 DIAGNOSIS — R739 Hyperglycemia, unspecified: Secondary | ICD-10-CM

## 2023-10-30 DIAGNOSIS — D509 Iron deficiency anemia, unspecified: Secondary | ICD-10-CM

## 2023-10-30 LAB — CBC WITH DIFFERENTIAL/PLATELET
Basophils Absolute: 0.1 K/uL (ref 0.0–0.1)
Basophils Relative: 1.5 % (ref 0.0–3.0)
Eosinophils Absolute: 0.1 K/uL (ref 0.0–0.7)
Eosinophils Relative: 3.5 % (ref 0.0–5.0)
HCT: 35.2 % — ABNORMAL LOW (ref 36.0–46.0)
Hemoglobin: 12 g/dL (ref 12.0–15.0)
Lymphocytes Relative: 27.6 % (ref 12.0–46.0)
Lymphs Abs: 1.1 K/uL (ref 0.7–4.0)
MCHC: 34.2 g/dL (ref 30.0–36.0)
MCV: 91.8 fl (ref 78.0–100.0)
Monocytes Absolute: 0.4 K/uL (ref 0.1–1.0)
Monocytes Relative: 10.1 % (ref 3.0–12.0)
Neutro Abs: 2.2 K/uL (ref 1.4–7.7)
Neutrophils Relative %: 57.3 % (ref 43.0–77.0)
Platelets: 206 K/uL (ref 150.0–400.0)
RBC: 3.83 Mil/uL — ABNORMAL LOW (ref 3.87–5.11)
RDW: 13.5 % (ref 11.5–15.5)
WBC: 3.9 K/uL — ABNORMAL LOW (ref 4.0–10.5)

## 2023-10-30 LAB — HEMOGLOBIN A1C: Hgb A1c MFr Bld: 6.2 % (ref 4.6–6.5)

## 2023-10-30 LAB — LIPID PANEL
Cholesterol: 154 mg/dL (ref 0–200)
HDL: 82.6 mg/dL (ref >39.00)
LDL Cholesterol: 58 mg/dL (ref 0–99)
NonHDL: 71.64
Total CHOL/HDL Ratio: 2
Triglycerides: 69 mg/dL (ref 0.0–149.0)
VLDL: 13.8 mg/dL (ref 0.0–40.0)

## 2023-10-30 LAB — IBC + FERRITIN
Ferritin: 44.7 ng/mL (ref 10.0–291.0)
Iron: 73 ug/dL (ref 42–145)
Saturation Ratios: 26.5 % (ref 20.0–50.0)
TIBC: 275.8 ug/dL (ref 250.0–450.0)
Transferrin: 197 mg/dL — ABNORMAL LOW (ref 212.0–360.0)

## 2023-10-30 LAB — HEPATIC FUNCTION PANEL
ALT: 19 U/L (ref 0–35)
AST: 24 U/L (ref 0–37)
Albumin: 4.3 g/dL (ref 3.5–5.2)
Alkaline Phosphatase: 73 U/L (ref 39–117)
Bilirubin, Direct: 0.1 mg/dL (ref 0.0–0.3)
Total Bilirubin: 0.3 mg/dL (ref 0.2–1.2)
Total Protein: 6.5 g/dL (ref 6.0–8.3)

## 2023-10-30 LAB — BASIC METABOLIC PANEL WITH GFR
BUN: 14 mg/dL (ref 6–23)
CO2: 33 meq/L — ABNORMAL HIGH (ref 19–32)
Calcium: 9.5 mg/dL (ref 8.4–10.5)
Chloride: 103 meq/L (ref 96–112)
Creatinine, Ser: 0.83 mg/dL (ref 0.40–1.20)
GFR: 71.2 mL/min (ref >60.00)
Glucose, Bld: 100 mg/dL — ABNORMAL HIGH (ref 70–99)
Potassium: 4.8 meq/L (ref 3.5–5.1)
Sodium: 141 meq/L (ref 135–145)

## 2023-10-30 LAB — VITAMIN B12: Vitamin B-12: 484 pg/mL (ref 211–911)

## 2023-10-30 LAB — TSH: TSH: 2.51 u[IU]/mL (ref 0.35–5.50)

## 2023-11-01 ENCOUNTER — Ambulatory Visit (INDEPENDENT_AMBULATORY_CARE_PROVIDER_SITE_OTHER): Admitting: Internal Medicine

## 2023-11-01 VITALS — BP 122/68 | HR 78 | Resp 16 | Ht 70.0 in | Wt 166.4 lb

## 2023-11-01 DIAGNOSIS — K219 Gastro-esophageal reflux disease without esophagitis: Secondary | ICD-10-CM

## 2023-11-01 DIAGNOSIS — D329 Benign neoplasm of meninges, unspecified: Secondary | ICD-10-CM

## 2023-11-01 DIAGNOSIS — D0511 Intraductal carcinoma in situ of right breast: Secondary | ICD-10-CM | POA: Diagnosis not present

## 2023-11-01 DIAGNOSIS — D509 Iron deficiency anemia, unspecified: Secondary | ICD-10-CM | POA: Diagnosis not present

## 2023-11-01 DIAGNOSIS — E039 Hypothyroidism, unspecified: Secondary | ICD-10-CM

## 2023-11-01 DIAGNOSIS — N644 Mastodynia: Secondary | ICD-10-CM

## 2023-11-01 DIAGNOSIS — R739 Hyperglycemia, unspecified: Secondary | ICD-10-CM | POA: Diagnosis not present

## 2023-11-01 NOTE — Progress Notes (Signed)
 Subjective:    Patient ID: Sarah Perry, female    DOB: 1950-11-10, 73 y.o.   MRN: [id number]  Patient here for  Chief Complaint  Patient presents with   Medical Management of Chronic Issues    HPI Here for a scheduled follow up.  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 Completed XRT 03/14/23. Continues on tamoxifen  for 5 years - to complete treatment in 03/2028. Had f/u with Dr Lenn 10/24/23 - stable.she reports noticing some discomfort - right breast. Evaluated by Dr  Camelia. Felt no further w/up warranted. Has been doing yoga. Some soreness and itching. No rash.  F/u 6 months. Had f/u with Dr Bluford 02/06/23 - f/u cavernous sinus and clinoid meningioma. Elected continued surveillance and f/u MRI in one year. Saw endocrinology 02/08/23 - stable. Mild persistent stable hyperprolactinemia. F/u 12 months. Last visit, was having increased hot flashes. Effexor  was increased to 150mg  q day.    Past Medical History:  Diagnosis Date   Basal cell carcinoma 12/08/2014   L spinal mid back    Basal cell carcinoma 12/08/2014   R spinal upper back    Basal cell carcinoma 12/08/2014   L upper sternum    Basal cell carcinoma 02/11/2018   L med pretibial    Basal cell carcinoma 02/11/2018   L lower pretibial    Basal cell carcinoma 09/17/2018   R shoulder    Basal cell carcinoma 09/17/2018   L lat pretibial    Basal cell carcinoma 02/18/2019   R parietal scalp    Basal cell carcinoma 02/28/2019   Central nasal tip    Basal cell carcinoma 09/23/2019   R mid chin    Basal cell carcinoma 09/23/2019   R clavicle    Basal cell carcinoma 10/14/2020   left superior medial forehead, Moh's 02/07/21   Basal cell carcinoma 10/14/2020   right medial cheek, Moh's 02/07/21   Basal cell carcinoma 11/08/2021   L nasal ala, Mohs 01/09/2022   BCC (basal cell carcinoma of skin) 10/14/2020   L preauricular, exc 12/27/20   BCC (basal cell carcinoma of skin) 06/06/2022   left popliteal, edc    BCC (basal cell carcinoma of skin) 06/06/2022   right posterior ankle, Superficial, edc   Degenerative arthritis    hips, knees   GERD (gastroesophageal reflux disease)    Varicella zoster 01/2008   Past Surgical History:  Procedure Laterality Date   BASAL CELL CARCINOMA EXCISION     BREAST BIOPSY Right 11/29/2022   Right Stereo Bx, X clip- path pending   BREAST BIOPSY Right 11/29/2022   MM RT BREAST BX W LOC DEV 1ST LESION IMAGE BX SPEC STEREO GUIDE 11/29/2022 [facility]   BREAST BIOPSY  01/03/2023   MM RT RADIOACTIVE SEED LOC MAMMO GUIDE 01/03/2023 [facility]   BREAST LUMPECTOMY WITH RADIOACTIVE SEED LOCALIZATION Right 01/04/2023   Procedure: RIGHT BREAST SEED GUIDED LUMPECTOMY;  Surgeon: Ebbie Cough, MD;  Location: [facility];  Service: General;  Laterality: Right;  LMA   DILATION AND CURETTAGE OF UTERUS     TONSILECTOMY/ADENOIDECTOMY WITH MYRINGOTOMY     Family History  Problem Relation Age of Onset   Ovarian cancer Mother    Hypercholesterolemia Mother    Dementia Father    Prostate cancer Father    Melanoma Sister    Breast cancer Sister 95   Pancreatic cancer Maternal Aunt    Breast cancer Paternal Aunt    Social History   Socioeconomic  History   Marital status: Married    Spouse name: Not on file   Number of children: 2   Years of education: Not on file   Highest education level: Bachelor's degree (e.g., BA, AB, BS)  Occupational History   Not on file  Tobacco Use   Smoking status: Never   Smokeless tobacco: Never  Vaping Use   Vaping status: Never Used  Substance and Sexual Activity   Alcohol use: Yes    Alcohol/week: 0.0 standard drinks of alcohol    Comment: glass of wine daily   Drug use: No   Sexual activity: Yes    Birth control/protection: None  Other Topics Concern   Not on file  Social History Narrative   Married   Social Drivers of Health   Financial Resource Strain: Low Risk  (10/28/2023)   Overall  Financial Resource Strain (CARDIA)    Difficulty of Paying Living Expenses: Not hard at all  Food Insecurity: No Food Insecurity (10/28/2023)   Hunger Vital Sign    Worried About Running Out of Food in the Last Year: Never true    Ran Out of Food in the Last Year: Never true  Transportation Needs: No Transportation Needs (10/28/2023)   PRAPARE - Administrator, Civil Service (Medical): No    Lack of Transportation (Non-Medical): No  Physical Activity: Sufficiently Active (10/28/2023)   Exercise Vital Sign    Days of Exercise per Week: 5 days    Minutes of Exercise per Session: 30 min  Stress: No Stress Concern Present (10/28/2023)   [employer] of Occupational Health - Occupational Stress Questionnaire    Feeling of Stress: Not at all  Social Connections: Socially Integrated (10/28/2023)   Social Connection and Isolation Panel    Frequency of Communication with Friends and Family: Three times a week    Frequency of Social Gatherings with Friends and Family: More than three times a week    Attends Religious Services: More than 4 times per year    Active Member of Clubs or Organizations: Yes    Attends Engineer, structural: More than 4 times per year    Marital Status: Married     Review of Systems  Constitutional:  Negative for appetite change and unexpected weight change.  HENT:  Negative for congestion and sinus pressure.   Respiratory:  Negative for cough, chest tightness and shortness of breath.   Cardiovascular:  Negative for chest pain and palpitations.  Gastrointestinal:  Negative for abdominal pain, diarrhea, nausea and vomiting.  Genitourinary:  Negative for difficulty urinating and dysuria.  Musculoskeletal:  Negative for joint swelling and myalgias.  Skin:  Negative for color change and rash.  Neurological:  Negative for dizziness and headaches.  Psychiatric/Behavioral:  Negative for agitation and dysphoric mood.        Objective:     BP  122/68   Pulse 78   Resp 16   Ht 5' 10 (1.778 m)   Wt 166 lb 6.4 oz (75.5 kg)   SpO2 98%   BMI 23.88 kg/m  Wt Readings from Last 3 Encounters:  11/01/23 166 lb 6.4 oz (75.5 kg)  10/24/23 161 lb (73 kg)  06/22/23 169 lb 9.6 oz (76.9 kg)    Physical Exam Vitals reviewed.  Constitutional:      General: She is not in acute distress.    Appearance: Normal appearance.  HENT:     Head: Normocephalic and atraumatic.     Right Ear:  External ear normal.     Left Ear: External ear normal.     Mouth/Throat:     Pharynx: No oropharyngeal exudate or posterior oropharyngeal erythema.  Eyes:     General: No scleral icterus.       Right eye: No discharge.        Left eye: No discharge.     Conjunctiva/sclera: Conjunctivae normal.  Neck:     Thyroid : No thyromegaly.  Cardiovascular:     Rate and Rhythm: Normal rate and regular rhythm.  Pulmonary:     Effort: No respiratory distress.     Breath sounds: Normal breath sounds. No wheezing.     Comments: Breast exam - no nipple discharge or nipple retraction present. Could not appreciate a distinct nodule or axillary adenopathy.  Abdominal:     General: Bowel sounds are normal.     Palpations: Abdomen is soft.     Tenderness: There is no abdominal tenderness.  Musculoskeletal:        General: No swelling or tenderness.     Cervical back: Neck supple. No tenderness.  Lymphadenopathy:     Cervical: No cervical adenopathy.  Skin:    Findings: No erythema or rash.  Neurological:     Mental Status: She is alert.  Psychiatric:        Mood and Affect: Mood normal.        Behavior: Behavior normal.         Outpatient Encounter Medications as of 11/01/2023  Medication Sig   MAGNESIUM GLYCINATE PO Take by mouth daily.   aspirin 81 MG EC tablet Take 1 tablet by mouth daily.   calcium -vitamin D (OSCAL-500) 500-400 MG-UNIT tablet Take 1 tablet by mouth daily.    levothyroxine (SYNTHROID) 50 MCG tablet Take 50 mcg by mouth daily.    LUMIGAN 0.01 % SOLN SMARTSIG:In Eye(s)   Multiple Vitamin (MULTIVITAMIN) tablet Take 1 tablet by mouth daily.   omeprazole  (PRILOSEC) 20 MG capsule TAKE 1 CAPSULE BY MOUTH DAILY   rosuvastatin  (CRESTOR ) 10 MG tablet Take 1 tablet (10 mg total) by mouth daily.   tamoxifen  (NOLVADEX ) 20 MG tablet Take 1 tablet (20 mg total) by mouth daily.   valACYclovir  (VALTREX ) 1000 MG tablet Take 2 grams every 12 hours for 2 doses with a glass of water at the first sign of symptoms.   venlafaxine  XR (EFFEXOR  XR) 150 MG 24 hr capsule Take 1 capsule (150 mg total) by mouth daily with breakfast.   [DISCONTINUED] Magnesium Oxide 400 (240 Mg) MG TABS    No facility-administered encounter medications on file as of 11/01/2023.     Lab Results  Component Value Date   WBC 3.9 (L) 10/30/2023   HGB 12.0 10/30/2023   HCT 35.2 (L) 10/30/2023   PLT 206.0 10/30/2023   GLUCOSE 100 (H) 10/30/2023   CHOL 154 10/30/2023   TRIG 69.0 10/30/2023   HDL 82.60 10/30/2023   LDLCALC 58 10/30/2023   ALT 19 10/30/2023   AST 24 10/30/2023   NA 141 10/30/2023   K 4.8 10/30/2023   CL 103 10/30/2023   CREATININE 0.83 10/30/2023   BUN 14 10/30/2023   CO2 33 (H) 10/30/2023   TSH 2.51 10/30/2023   INR 0.9 10/24/2018   HGBA1C 6.2 10/30/2023    No results found.     Assessment & Plan:  Ductal carcinoma in situ (DCIS) of right breast Assessment & Plan:  Seeing Dr Jacobo DCIS - s/p lumpectomy 01/04/23. Completed XRT 03/14/23. Continues on  tamoxifen  for 5 years - to complete treatment in 03/2028. 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 Completed XRT 03/14/23. Continues on tamoxifen  for 5 years - to complete treatment in 03/2028. Breast discomfort as outlined. She has been doing yoga and working out in her yard. Evaluated by Dr Camelia recently. Discussed with him. No further w/up felt warranted. Call with update.    Hyperglycemia Assessment & Plan: Low carb diet and exercise. Follow met b and A1c.   Orders: -      Basic metabolic panel with GFR; Future -     Hemoglobin A1c; Future -     Hepatic function panel; Future  Hypothyroidism, unspecified type -     Hemoglobin A1c; Future -     Lipid panel; Future -     Hepatic function panel; Future  Iron deficiency anemia, unspecified iron deficiency anemia type Assessment & Plan: Check cbc.   Orders: -     CBC with Differential/Platelet; Future  Gastroesophageal reflux disease without esophagitis Assessment & Plan: No upper symptoms reported. Continues on omeprazole .    Meningioma [facility]) Assessment & Plan:  Had f/u with Dr Bluford 02/06/23 - f/u cavernous sinus and clinoid meningioma. Elected continued surveillance. Recommended f/u MRI one year from last.    Breast tenderness Assessment & Plan: Right breast sore. Could not apprciate any distinct nodules or axillary adenopathy present. Had evaluated by Dr Camelia. Discussed concern may be related to yoga,working in yard, etc. Avoid strenuous/strained positions. Follow. Call with update.       Allena Hamilton, MD

## 2023-11-04 ENCOUNTER — Encounter: Payer: Self-pay | Admitting: Internal Medicine

## 2023-11-04 DIAGNOSIS — N644 Mastodynia: Secondary | ICD-10-CM | POA: Insufficient documentation

## 2023-11-04 NOTE — Assessment & Plan Note (Signed)
 Right breast sore. Could not apprciate any distinct nodules or axillary adenopathy present. Had evaluated by Dr Camelia. Discussed concern may be related to yoga,working in yard, etc. Avoid strenuous/strained positions. Follow. Call with update.

## 2023-11-04 NOTE — Assessment & Plan Note (Signed)
 No upper symptoms reported. Continues on omeprazole .

## 2023-11-04 NOTE — Assessment & Plan Note (Addendum)
 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 Completed XRT 03/14/23. Continues on tamoxifen  for 5 years - to complete treatment in 03/2028. 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 Completed XRT 03/14/23. Continues on tamoxifen  for 5 years - to complete treatment in 03/2028. Breast discomfort as outlined. She has been doing yoga and working out in her yard. Evaluated by Dr Camelia recently. Discussed with him. No further w/up felt warranted. Call with update.

## 2023-11-04 NOTE — Assessment & Plan Note (Signed)
 Had f/u with Dr Adriana Simas 02/06/23 - f/u cavernous sinus and clinoid meningioma. Elected continued surveillance. Recommended f/u MRI one year from last.

## 2023-11-04 NOTE — Assessment & Plan Note (Signed)
 Check cbc

## 2023-11-04 NOTE — Assessment & Plan Note (Signed)
 Low-carb diet and exercise.  Follow met b and A1c.

## 2023-11-13 DIAGNOSIS — H9313 Tinnitus, bilateral: Secondary | ICD-10-CM | POA: Diagnosis not present

## 2023-11-13 DIAGNOSIS — H903 Sensorineural hearing loss, bilateral: Secondary | ICD-10-CM | POA: Diagnosis not present

## 2023-11-14 ENCOUNTER — Encounter: Payer: Self-pay | Admitting: Internal Medicine

## 2023-11-18 ENCOUNTER — Encounter: Payer: Self-pay | Admitting: Internal Medicine

## 2023-11-18 ENCOUNTER — Other Ambulatory Visit: Payer: Self-pay | Admitting: Internal Medicine

## 2023-12-12 ENCOUNTER — Encounter: Payer: Self-pay | Admitting: Oncology

## 2023-12-12 ENCOUNTER — Inpatient Hospital Stay: Attending: Oncology | Admitting: Oncology

## 2023-12-12 VITALS — BP 104/68 | HR 83 | Temp 97.3°F | Resp 16 | Wt 166.7 lb

## 2023-12-12 DIAGNOSIS — Z808 Family history of malignant neoplasm of other organs or systems: Secondary | ICD-10-CM | POA: Insufficient documentation

## 2023-12-12 DIAGNOSIS — D0511 Intraductal carcinoma in situ of right breast: Secondary | ICD-10-CM | POA: Insufficient documentation

## 2023-12-12 DIAGNOSIS — Z79810 Long term (current) use of selective estrogen receptor modulators (SERMs): Secondary | ICD-10-CM | POA: Insufficient documentation

## 2023-12-12 DIAGNOSIS — Z85828 Personal history of other malignant neoplasm of skin: Secondary | ICD-10-CM | POA: Insufficient documentation

## 2023-12-12 DIAGNOSIS — N951 Menopausal and female climacteric states: Secondary | ICD-10-CM | POA: Diagnosis not present

## 2023-12-12 DIAGNOSIS — Z803 Family history of malignant neoplasm of breast: Secondary | ICD-10-CM | POA: Insufficient documentation

## 2023-12-12 DIAGNOSIS — Z8041 Family history of malignant neoplasm of ovary: Secondary | ICD-10-CM | POA: Insufficient documentation

## 2023-12-12 DIAGNOSIS — Z800 Family history of malignant neoplasm of digestive organs: Secondary | ICD-10-CM | POA: Insufficient documentation

## 2023-12-12 NOTE — Progress Notes (Signed)
 [facility]  [phone] [phone]  ID: Sarah Perry OB: 1951-04-01  MR#: [id number]  RDW#:[id number]  Patient Care Team: Glendia Shad, MD as PCP - General (Internal Medicine) Georgina Shasta POUR, RN as Oncology Nurse Navigator Jacobo, Evalene PARAS, MD as Consulting Physician (Oncology) Lenn Aran, MD as Consulting Physician (Radiation Oncology) Ebbie Cough, MD as Consulting Physician (General Surgery)  CHIEF COMPLAINT: DCIS right breast.  INTERVAL HISTORY: Patient returns to clinic today for routine 72-month evaluation.  She is tolerating tamoxifen  well except for occasional hot flashes that do not affect her day-to-day activity.  She has no neurologic complaints.  She denies any recent fevers or illnesses.  She has a good appetite and denies weight loss.  She has no chest pain, shortness of breath, cough, or hemoptysis.  She denies any nausea, vomiting, constipation, or diarrhea.  She has no urinary complaints.  Patient offers no further specific complaints today.  REVIEW OF SYSTEMS:   Review of Systems  Constitutional: Negative.  Negative for fever, malaise/fatigue and weight loss.  Respiratory: Negative.  Negative for cough, hemoptysis and shortness of breath.   Cardiovascular: Negative.  Negative for chest pain and leg swelling.  Gastrointestinal: Negative.  Negative for abdominal pain.  Genitourinary: Negative.  Negative for dysuria.  Musculoskeletal: Negative.  Negative for back pain.  Neurological:  Positive for sensory change. Negative for dizziness, focal weakness, weakness and headaches.  Psychiatric/Behavioral: Negative.  The patient is not nervous/anxious.     As per HPI. Otherwise, a complete review of systems is negative.  PAST MEDICAL HISTORY: Past Medical History:  Diagnosis Date   Basal cell carcinoma 12/08/2014   L spinal mid back    Basal cell carcinoma 12/08/2014   R spinal upper back    Basal cell carcinoma  12/08/2014   L upper sternum    Basal cell carcinoma 02/11/2018   L med pretibial    Basal cell carcinoma 02/11/2018   L lower pretibial    Basal cell carcinoma 09/17/2018   R shoulder    Basal cell carcinoma 09/17/2018   L lat pretibial    Basal cell carcinoma 02/18/2019   R parietal scalp    Basal cell carcinoma 02/28/2019   Central nasal tip    Basal cell carcinoma 09/23/2019   R mid chin    Basal cell carcinoma 09/23/2019   R clavicle    Basal cell carcinoma 10/14/2020   left superior medial forehead, Moh's 02/07/21   Basal cell carcinoma 10/14/2020   right medial cheek, Moh's 02/07/21   Basal cell carcinoma 11/08/2021   L nasal ala, Mohs 01/09/2022   BCC (basal cell carcinoma of skin) 10/14/2020   L preauricular, exc 12/27/20   BCC (basal cell carcinoma of skin) 06/06/2022   left popliteal, edc   BCC (basal cell carcinoma of skin) 06/06/2022   right posterior ankle, Superficial, edc   Degenerative arthritis    hips, knees   GERD (gastroesophageal reflux disease)    Varicella zoster 01/2008    PAST SURGICAL HISTORY: Past Surgical History:  Procedure Laterality Date   BASAL CELL CARCINOMA EXCISION     BREAST BIOPSY Right 11/29/2022   Right Stereo Bx, X clip- path pending   BREAST BIOPSY Right 11/29/2022   MM RT BREAST BX W LOC DEV 1ST LESION IMAGE BX SPEC STEREO GUIDE 11/29/2022 [facility]   BREAST BIOPSY  01/03/2023   MM RT RADIOACTIVE SEED LOC MAMMO GUIDE 01/03/2023 [facility]   BREAST LUMPECTOMY WITH RADIOACTIVE  SEED LOCALIZATION Right 01/04/2023   Procedure: RIGHT BREAST SEED GUIDED LUMPECTOMY;  Surgeon: Ebbie Cough, MD;  Location: [facility];  Service: General;  Laterality: Right;  LMA   DILATION AND CURETTAGE OF UTERUS     TONSILECTOMY/ADENOIDECTOMY WITH MYRINGOTOMY      FAMILY HISTORY: Family History  Problem Relation Age of Onset   Ovarian cancer Mother    Hypercholesterolemia Mother    Dementia Father    Prostate  cancer Father    Melanoma Sister    Breast cancer Sister 33   Pancreatic cancer Maternal Aunt    Breast cancer Paternal Aunt     ADVANCED DIRECTIVES (Y/N):  N  HEALTH MAINTENANCE: Social History   Tobacco Use   Smoking status: Never   Smokeless tobacco: Never  Vaping Use   Vaping status: Never Used  Substance Use Topics   Alcohol use: Yes    Alcohol/week: 0.0 standard drinks of alcohol    Comment: glass of wine daily   Drug use: No     Colonoscopy:  PAP:  Bone density:  Lipid panel:  Allergies  Allergen Reactions   Penicillins Other (See Comments)    Unsure from childhood     Current Outpatient Medications  Medication Sig Dispense Refill   aspirin 81 MG EC tablet Take 1 tablet by mouth daily.     calcium -vitamin D (OSCAL-500) 500-400 MG-UNIT tablet Take 1 tablet by mouth daily.      levothyroxine (SYNTHROID) 50 MCG tablet Take 50 mcg by mouth daily.     LUMIGAN 0.01 % SOLN SMARTSIG:In Eye(s)     MAGNESIUM GLYCINATE PO Take by mouth daily.     Multiple Vitamin (MULTIVITAMIN) tablet Take 1 tablet by mouth daily.     omeprazole  (PRILOSEC) 20 MG capsule TAKE 1 CAPSULE BY MOUTH DAILY 90 capsule 3   rosuvastatin  (CRESTOR ) 10 MG tablet TAKE 1 TABLET BY MOUTH DAILY 90 tablet 1   tamoxifen  (NOLVADEX ) 20 MG tablet Take 1 tablet (20 mg total) by mouth daily. 90 tablet 3   valACYclovir  (VALTREX ) 1000 MG tablet Take 2 grams every 12 hours for 2 doses with a glass of water at the first sign of symptoms. 30 tablet 1   venlafaxine  XR (EFFEXOR  XR) 150 MG 24 hr capsule Take 1 capsule (150 mg total) by mouth daily with breakfast. 90 capsule 1   No current facility-administered medications for this visit.    OBJECTIVE: Vitals:   12/12/23 1344  BP: 104/68  Pulse: 83  Resp: 16  Temp: (!) 97.3 F (36.3 C)  SpO2: 100%      Body mass index is 23.92 kg/m.    ECOG FS:0 - Asymptomatic  General: Well-developed, well-nourished, no acute distress. Eyes: Pink conjunctiva, anicteric  sclera. HEENT: Normocephalic, moist mucous membranes. Lungs: No audible wheezing or coughing. Heart: Regular rate and rhythm. Abdomen: Soft, nontender, no obvious distention. Musculoskeletal: No edema, cyanosis, or clubbing. Neuro: Alert, answering all questions appropriately. Cranial nerves grossly intact. Skin: No rashes or petechiae noted. Psych: Normal affect.  LAB RESULTS:  Lab Results  Component Value Date   NA 141 10/30/2023   K 4.8 10/30/2023   CL 103 10/30/2023   CO2 33 (H) 10/30/2023   GLUCOSE 100 (H) 10/30/2023   BUN 14 10/30/2023   CREATININE 0.83 10/30/2023   CALCIUM  9.5 10/30/2023   PROT 6.5 10/30/2023   ALBUMIN 4.3 10/30/2023   AST 24 10/30/2023   ALT 19 10/30/2023   ALKPHOS 73 10/30/2023   BILITOT  0.3 10/30/2023   GFRNONAA >60 03/22/2019   GFRAA >60 03/22/2019    Lab Results  Component Value Date   WBC 3.9 (L) 10/30/2023   NEUTROABS 2.2 10/30/2023   HGB 12.0 10/30/2023   HCT 35.2 (L) 10/30/2023   MCV 91.8 10/30/2023   PLT 206.0 10/30/2023     STUDIES: No results found.  ASSESSMENT: DCIS right breast, ER positive, PR negative.  PLAN:    DCIS right breast: Patient underwent lumpectomy on January 04, 2023 confirming diagnosis.  She did not require adjuvant chemotherapy.  Patient completed adjuvant XRT on March 14, 2023.  Continue tamoxifen  for a total of 5 years completing treatment in December 2029.  No further interventions needed.  Patient will require bilateral screening mammogram in the next 1 to 2 weeks.  Return to clinic in 6 months for routine evaluation.  Hot flashes: Mild.  Patient reports they do not affect her day-to-day activity.  I spent a total of 20 minutes reviewing chart data, face-to-face evaluation with the patient, counseling and coordination of care as detailed above.    Patient expressed understanding and was in agreement with this plan. She also understands that She can call clinic at any time with any questions,  concerns, or complaints.    Cancer Staging  Ductal carcinoma in situ (DCIS) of right breast Staging form: Breast, AJCC 8th Edition - Clinical stage from 12/05/2022: Stage 0 (cTis (DCIS), cN0, cM0) - Signed by Jacobo Evalene PARAS, MD on 12/05/2022 Stage prefix: Initial diagnosis   Evalene PARAS Jacobo, MD   12/12/2023 1:59 PM

## 2023-12-13 ENCOUNTER — Telehealth: Payer: Self-pay | Admitting: Internal Medicine

## 2023-12-13 NOTE — Telephone Encounter (Signed)
 Left VM and MyChart message for pt to let her know lab appt has been moved from 7:45 to 8:15 on 03/05/24.   E2C2, please reschedule with pt if the time does not work for her -[facility]

## 2023-12-15 ENCOUNTER — Other Ambulatory Visit: Payer: Self-pay | Admitting: Internal Medicine

## 2023-12-27 ENCOUNTER — Ambulatory Visit
Admission: RE | Admit: 2023-12-27 | Discharge: 2023-12-27 | Disposition: A | Discharge status: Home / Self Care | Source: Ambulatory Visit | Attending: Oncology | Admitting: Oncology

## 2023-12-27 DIAGNOSIS — D0511 Intraductal carcinoma in situ of right breast: Secondary | ICD-10-CM

## 2023-12-27 DIAGNOSIS — R92323 Mammographic fibroglandular density, bilateral breasts: Secondary | ICD-10-CM | POA: Diagnosis not present

## 2024-01-08 ENCOUNTER — Ambulatory Visit: Admitting: Dermatology

## 2024-01-08 ENCOUNTER — Encounter: Payer: Self-pay | Admitting: Dermatology

## 2024-01-08 DIAGNOSIS — L814 Other melanin hyperpigmentation: Secondary | ICD-10-CM

## 2024-01-08 DIAGNOSIS — L820 Inflamed seborrheic keratosis: Secondary | ICD-10-CM | POA: Diagnosis not present

## 2024-01-08 DIAGNOSIS — L570 Actinic keratosis: Secondary | ICD-10-CM | POA: Diagnosis not present

## 2024-01-08 DIAGNOSIS — L821 Other seborrheic keratosis: Secondary | ICD-10-CM | POA: Diagnosis not present

## 2024-01-08 DIAGNOSIS — L720 Epidermal cyst: Secondary | ICD-10-CM

## 2024-01-08 DIAGNOSIS — D1801 Hemangioma of skin and subcutaneous tissue: Secondary | ICD-10-CM | POA: Diagnosis not present

## 2024-01-08 DIAGNOSIS — D692 Other nonthrombocytopenic purpura: Secondary | ICD-10-CM | POA: Diagnosis not present

## 2024-01-08 DIAGNOSIS — L578 Other skin changes due to chronic exposure to nonionizing radiation: Secondary | ICD-10-CM | POA: Diagnosis not present

## 2024-01-08 DIAGNOSIS — W908XXA Exposure to other nonionizing radiation, initial encounter: Secondary | ICD-10-CM

## 2024-01-08 DIAGNOSIS — Z85828 Personal history of other malignant neoplasm of skin: Secondary | ICD-10-CM | POA: Diagnosis not present

## 2024-01-08 NOTE — Patient Instructions (Addendum)

## 2024-01-08 NOTE — Progress Notes (Signed)
 Follow-Up Visit   Subjective  Sarah Perry is a 73 y.o. female who presents for the following: 6 month follow-up, check sun-exposed areas.  The patient has spots, moles and lesions to be evaluated, some may be new or changing. She has a spot on each lower leg, treating with desoximetasone ointment.  History of BCCs.    The following portions of the chart were reviewed this encounter and updated as appropriate: medications, allergies, medical history  Review of Systems:  No other skin or systemic complaints except as noted in HPI or Assessment and Plan.  Objective  Well appearing patient in no apparent distress; mood and affect are within normal limits.  A focused examination was performed of the following areas: Face, arms, legs  Relevant physical exam findings are noted in the Assessment and Plan.  R lower knee x 1, L lower knee x 1, L pretibia x 1 Erythematous stuck-on, waxy papule or plaque mid nasal dorsum x 2 (2) Pink scaly macule.   Assessment & Plan  ACTINIC DAMAGE - chronic, secondary to cumulative UV radiation exposure/sun exposure over time - diffuse scaly erythematous macules with underlying dyspigmentation - Recommend daily broad spectrum sunscreen SPF 30+ to sun-exposed areas, reapply every 2 hours as needed.  - Recommend staying in the shade or wearing long sleeves, sun glasses (UVA+UVB protection) and wide brim hats (4-inch brim around the entire circumference of the hat). - Call for new or changing lesions.  HISTORY OF BASAL CELL CARCINOMA OF THE SKIN L popliteal, R post ankle - 06/07/2022 L nasal ala, 2023, Mohs Multiple on face, see history - No evidence of recurrence today - Recommend regular full body skin exams - Recommend daily broad spectrum sunscreen SPF 30+ to sun-exposed areas, reapply every 2 hours as needed.  - Call if any new or changing lesions are noted between office visits  SEBORRHEIC KERATOSIS - Stuck-on, waxy, tan-brown papules -  Benign-appearing - Discussed benign etiology and prognosis. - Observe - Call for any changes  LENTIGINES Exam: scattered tan macules Due to sun exposure Treatment Plan: Benign-appearing, observe. Recommend daily broad spectrum sunscreen SPF 30+ to sun-exposed areas, reapply every 2 hours as needed.  Call for any changes  HEMANGIOMA Exam: red papules; inf nose 2.5 mm red papule- no changes per pt, no bleeding  Discussed benign nature. Recommend observation. Call for changes.  Milia - tiny firm white papules at forehead - type of cyst - benign - sometimes these will clear with nightly OTC adapalene/Differin 0.1% gel or retinol. - may be extracted if symptomatic - observe  Purpura - Chronic; persistent and recurrent.  Treatable, but not curable. - Violaceous macules and patches - Benign - Related to trauma, age, sun damage and/or use of blood thinners, chronic use of topical and/or oral steroids - Observe - Can use OTC arnica containing moisturizer such as Dermend Bruise Formula if desired - Call for worsening or other concerns  INFLAMED SEBORRHEIC KERATOSIS R lower knee x 1, L lower knee x 1, L pretibia x 1 Symptomatic, irritating, patient would like treated. Destruction of lesion - R lower knee x 1, L lower knee x 1, L pretibia x 1  Destruction method: cryotherapy   Informed consent: discussed and consent obtained   Lesion destroyed using liquid nitrogen: Yes   Region frozen until ice ball extended beyond lesion: Yes   Outcome: patient tolerated procedure well with no complications   Post-procedure details: wound care instructions given   Additional details:  Prior  to procedure, discussed risks of blister formation, small wound, skin dyspigmentation, or rare scar following cryotherapy. Recommend Vaseline ointment to treated areas while healing.   AK (ACTINIC KERATOSIS) (2) mid nasal dorsum x 2 (2) vs Telangiectasia. Recheck on follow-up.  Actinic keratoses are  precancerous spots that appear secondary to cumulative UV radiation exposure/sun exposure over time. They are chronic with expected duration over 1 year. A portion of actinic keratoses will progress to squamous cell carcinoma of the skin. It is not possible to reliably predict which spots will progress to skin cancer and so treatment is recommended to prevent development of skin cancer.  Recommend daily broad spectrum sunscreen SPF 30+ to sun-exposed areas, reapply every 2 hours as needed.  Recommend staying in the shade or wearing long sleeves, sun glasses (UVA+UVB protection) and wide brim hats (4-inch brim around the entire circumference of the hat). Call for new or changing lesions. Destruction of lesion - mid nasal dorsum x 2 (2)  Destruction method: cryotherapy   Informed consent: discussed and consent obtained   Lesion destroyed using liquid nitrogen: Yes   Region frozen until ice ball extended beyond lesion: Yes   Outcome: patient tolerated procedure well with no complications   Post-procedure details: wound care instructions given   Additional details:  Prior to procedure, discussed risks of blister formation, small wound, skin dyspigmentation, or rare scar following cryotherapy. Recommend Vaseline ointment to treated areas while healing.   ACTINIC SKIN DAMAGE   HISTORY OF BASAL CELL CARCINOMA (BCC) OF SKIN   SEBORRHEIC KERATOSIS   LENTIGO   HEMANGIOMA OF SKIN   MILIA   SENILE PURPURA     Return in about 6 months (around 07/07/2024) for TBSE, Hx BCC, Hx AKs. Recheck nose and photos taken 07/02/2023 visit.  IAndrea Kerns, CMA, am acting as scribe for Rexene Rattler, MD .   Documentation: I have reviewed the above documentation for accuracy and completeness, and I agree with the above.  Rexene Rattler, MD

## 2024-01-09 ENCOUNTER — Ambulatory Visit: Admission: RE | Admit: 2024-01-09 | Payer: Medicare HMO | Source: Ambulatory Visit

## 2024-01-16 ENCOUNTER — Ambulatory Visit
Admission: RE | Admit: 2024-01-16 | Discharge: 2024-01-16 | Disposition: A | Discharge status: Home / Self Care | Source: Ambulatory Visit | Attending: Neurosurgery | Admitting: Neurosurgery

## 2024-01-16 DIAGNOSIS — D329 Benign neoplasm of meninges, unspecified: Secondary | ICD-10-CM | POA: Diagnosis not present

## 2024-01-16 DIAGNOSIS — R9082 White matter disease, unspecified: Secondary | ICD-10-CM | POA: Diagnosis not present

## 2024-01-16 DIAGNOSIS — Z86018 Personal history of other benign neoplasm: Secondary | ICD-10-CM | POA: Diagnosis not present

## 2024-01-16 MED ORDER — GADOBUTROL 1 MMOL/ML IV SOLN
7.5000 mL | Freq: Once | INTRAVENOUS | Status: AC | PRN
  Administered 2024-01-16: 7.5 mL via INTRAVENOUS

## 2024-01-29 DIAGNOSIS — D329 Benign neoplasm of meninges, unspecified: Secondary | ICD-10-CM | POA: Diagnosis not present

## 2024-02-04 DIAGNOSIS — E221 Hyperprolactinemia: Secondary | ICD-10-CM | POA: Diagnosis not present

## 2024-02-04 DIAGNOSIS — D352 Benign neoplasm of pituitary gland: Secondary | ICD-10-CM | POA: Diagnosis not present

## 2024-02-11 DIAGNOSIS — E221 Hyperprolactinemia: Secondary | ICD-10-CM | POA: Diagnosis not present

## 2024-02-11 DIAGNOSIS — Z1331 Encounter for screening for depression: Secondary | ICD-10-CM | POA: Diagnosis not present

## 2024-02-11 DIAGNOSIS — E039 Hypothyroidism, unspecified: Secondary | ICD-10-CM | POA: Diagnosis not present

## 2024-02-11 DIAGNOSIS — D352 Benign neoplasm of pituitary gland: Secondary | ICD-10-CM | POA: Diagnosis not present

## 2024-02-14 DIAGNOSIS — D0511 Intraductal carcinoma in situ of right breast: Secondary | ICD-10-CM | POA: Diagnosis not present

## 2024-02-15 DIAGNOSIS — H40053 Ocular hypertension, bilateral: Secondary | ICD-10-CM | POA: Diagnosis not present

## 2024-02-22 DIAGNOSIS — H40053 Ocular hypertension, bilateral: Secondary | ICD-10-CM | POA: Diagnosis not present

## 2024-03-05 ENCOUNTER — Other Ambulatory Visit

## 2024-03-05 ENCOUNTER — Ambulatory Visit: Payer: Self-pay | Admitting: Internal Medicine

## 2024-03-05 ENCOUNTER — Other Ambulatory Visit (INDEPENDENT_AMBULATORY_CARE_PROVIDER_SITE_OTHER)

## 2024-03-05 DIAGNOSIS — R739 Hyperglycemia, unspecified: Secondary | ICD-10-CM | POA: Diagnosis not present

## 2024-03-05 DIAGNOSIS — D509 Iron deficiency anemia, unspecified: Secondary | ICD-10-CM

## 2024-03-05 DIAGNOSIS — E039 Hypothyroidism, unspecified: Secondary | ICD-10-CM | POA: Diagnosis not present

## 2024-03-05 LAB — CBC WITH DIFFERENTIAL/PLATELET
Basophils Absolute: 0.1 K/uL (ref 0.0–0.1)
Basophils Relative: 1.2 % (ref 0.0–3.0)
Eosinophils Absolute: 0.1 K/uL (ref 0.0–0.7)
Eosinophils Relative: 3.5 % (ref 0.0–5.0)
HCT: 35.4 % — ABNORMAL LOW (ref 36.0–46.0)
Hemoglobin: 12 g/dL (ref 12.0–15.0)
Lymphocytes Relative: 25.5 % (ref 12.0–46.0)
Lymphs Abs: 1.1 K/uL (ref 0.7–4.0)
MCHC: 33.8 g/dL (ref 30.0–36.0)
MCV: 92.1 fl (ref 78.0–100.0)
Monocytes Absolute: 0.4 K/uL (ref 0.1–1.0)
Monocytes Relative: 10.4 % (ref 3.0–12.0)
Neutro Abs: 2.5 K/uL (ref 1.4–7.7)
Neutrophils Relative %: 59.4 % (ref 43.0–77.0)
Platelets: 225 K/uL (ref 150.0–400.0)
RBC: 3.85 Mil/uL — ABNORMAL LOW (ref 3.87–5.11)
RDW: 13.3 % (ref 11.5–15.5)
WBC: 4.2 K/uL (ref 4.0–10.5)

## 2024-03-05 LAB — BASIC METABOLIC PANEL WITH GFR
BUN: 18 mg/dL (ref 6–23)
CO2: 32 meq/L (ref 19–32)
Calcium: 9.4 mg/dL (ref 8.4–10.5)
Chloride: 100 meq/L (ref 96–112)
Creatinine, Ser: 0.89 mg/dL (ref 0.40–1.20)
GFR: 65.32 mL/min (ref >60.00)
Glucose, Bld: 94 mg/dL (ref 70–99)
Potassium: 4.6 meq/L (ref 3.5–5.1)
Sodium: 139 meq/L (ref 135–145)

## 2024-03-05 LAB — HEPATIC FUNCTION PANEL
ALT: 18 U/L (ref 0–35)
AST: 25 U/L (ref 0–37)
Albumin: 4.4 g/dL (ref 3.5–5.2)
Alkaline Phosphatase: 69 U/L (ref 39–117)
Bilirubin, Direct: 0.1 mg/dL (ref 0.0–0.3)
Total Bilirubin: 0.4 mg/dL (ref 0.2–1.2)
Total Protein: 6.6 g/dL (ref 6.0–8.3)

## 2024-03-05 LAB — LIPID PANEL
Cholesterol: 149 mg/dL (ref 0–200)
HDL: 87.8 mg/dL (ref >39.00)
LDL Cholesterol: 49 mg/dL (ref 0–99)
NonHDL: 60.79
Total CHOL/HDL Ratio: 2
Triglycerides: 59 mg/dL (ref 0.0–149.0)
VLDL: 11.8 mg/dL (ref 0.0–40.0)

## 2024-03-05 LAB — HEMOGLOBIN A1C: Hgb A1c MFr Bld: 5.8 % (ref 4.6–6.5)

## 2024-03-10 ENCOUNTER — Other Ambulatory Visit: Payer: Self-pay | Admitting: Oncology

## 2024-03-10 ENCOUNTER — Encounter: Payer: Self-pay | Admitting: Internal Medicine

## 2024-03-10 ENCOUNTER — Ambulatory Visit: Admitting: Internal Medicine

## 2024-03-10 VITALS — BP 110/70 | HR 82 | Temp 97.9°F | Ht 71.0 in | Wt 166.1 lb

## 2024-03-10 DIAGNOSIS — D509 Iron deficiency anemia, unspecified: Secondary | ICD-10-CM

## 2024-03-10 DIAGNOSIS — R944 Abnormal results of kidney function studies: Secondary | ICD-10-CM | POA: Diagnosis not present

## 2024-03-10 DIAGNOSIS — D329 Benign neoplasm of meninges, unspecified: Secondary | ICD-10-CM | POA: Diagnosis not present

## 2024-03-10 DIAGNOSIS — E039 Hypothyroidism, unspecified: Secondary | ICD-10-CM

## 2024-03-10 DIAGNOSIS — D0511 Intraductal carcinoma in situ of right breast: Secondary | ICD-10-CM | POA: Diagnosis not present

## 2024-03-10 DIAGNOSIS — K219 Gastro-esophageal reflux disease without esophagitis: Secondary | ICD-10-CM | POA: Diagnosis not present

## 2024-03-10 DIAGNOSIS — R739 Hyperglycemia, unspecified: Secondary | ICD-10-CM

## 2024-03-10 NOTE — Progress Notes (Signed)
 Subjective:    Patient ID: Sarah Perry, female    DOB: [birth year], 73 y.o.   MRN: [id number]  Patient here for  Chief Complaint  Patient presents with   Medical Management of Chronic Issues    4 mth f/u    HPI Here for a scheduled follow up. 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 Completed XRT 03/14/23. Continues on tamoxifen  for 5 years - to complete treatment in 03/2028. Had f/u with Dr Lenn 10/24/23. Had f/u with Dr Ebbie 02/14/24 - tolerating tamoxifen . No changes made. Had f/u with Dr Jacobo 12/12/23 - stable. Had f/u with Dr Damian 02/11/24 - seen in follow up for a meningioma of the sella turcica. She was initially diagnosed in 10/2018. She was last seen on 02/08/2023. Last MRI brain completed at [facility] on 01/16/2024 showed stable tumor size since 01/2021. She was seen in follow up by Dr Bluford, [facility], on 01/29/2024. She plans to follow up again with him in 2 years. She reports only complaint of sudden hearing loss a few months ago. She established with Audiology. She has hearing aides. Mild persistent stable hyperprolactinemia - no treatment recommended. Continue levothyroxine. Overall feels things are stable. Tries to stay active. Discussed recent labs.    Past Medical History:  Diagnosis Date   Basal cell carcinoma 12/08/2014   L spinal mid back    Basal cell carcinoma 12/08/2014   R spinal upper back    Basal cell carcinoma 12/08/2014   L upper sternum    Basal cell carcinoma 02/11/2018   L med pretibial    Basal cell carcinoma 02/11/2018   L lower pretibial    Basal cell carcinoma 09/17/2018   R shoulder    Basal cell carcinoma 09/17/2018   L lat pretibial    Basal cell carcinoma 02/18/2019   R parietal scalp    Basal cell carcinoma 02/28/2019   Central nasal tip    Basal cell carcinoma 09/23/2019   R mid chin    Basal cell carcinoma 09/23/2019   R clavicle    Basal cell carcinoma 10/14/2020   left superior medial forehead, Moh's  02/07/21   Basal cell carcinoma 10/14/2020   right medial cheek, Moh's 02/07/21   Basal cell carcinoma 11/08/2021   L nasal ala, Mohs 01/09/2022   BCC (basal cell carcinoma of skin) 10/14/2020   L preauricular, exc 12/27/20   BCC (basal cell carcinoma of skin) 06/06/2022   left popliteal, edc   BCC (basal cell carcinoma of skin) 06/06/2022   right posterior ankle, Superficial, edc   Degenerative arthritis    hips, knees   GERD (gastroesophageal reflux disease)    Varicella zoster 01/2008   Past Surgical History:  Procedure Laterality Date   BASAL CELL CARCINOMA EXCISION     BREAST BIOPSY Right 11/29/2022   Right Stereo Bx, X clip-  HIGH-GRADE DUCTAL CARCINOMA IN SITU (DCIS), COMEDO TYPE WITH CALCIFICATION   BREAST BIOPSY Right 11/29/2022   MM RT BREAST BX W LOC DEV 1ST LESION IMAGE BX SPEC STEREO GUIDE 11/29/2022 [facility]   BREAST BIOPSY  01/03/2023   MM RT RADIOACTIVE SEED LOC MAMMO GUIDE 01/03/2023 [facility]   BREAST LUMPECTOMY WITH RADIOACTIVE SEED LOCALIZATION Right 01/04/2023   Procedure: RIGHT BREAST SEED GUIDED LUMPECTOMY;  Surgeon: Ebbie Cough, MD;  Location: [facility];  Service: General;  Laterality: Right;  LMA   DILATION AND CURETTAGE OF UTERUS     TONSILECTOMY/ADENOIDECTOMY WITH MYRINGOTOMY  Family History  Problem Relation Age of Onset   Ovarian cancer Mother    Hypercholesterolemia Mother    Dementia Father    Prostate cancer Father    Melanoma Sister    Breast cancer Sister 35   Pancreatic cancer Maternal Aunt    Breast cancer Paternal Aunt    Social History   Socioeconomic History   Marital status: Married    Spouse name: Not on file   Number of children: 2   Years of education: Not on file   Highest education level: Bachelor's degree (e.g., BA, AB, BS)  Occupational History   Not on file  Tobacco Use   Smoking status: Never   Smokeless tobacco: Never  Vaping Use   Vaping status: Never Used  Substance and  Sexual Activity   Alcohol use: Yes    Alcohol/week: 0.0 standard drinks of alcohol    Comment: glass of wine daily   Drug use: No   Sexual activity: Yes    Birth control/protection: None  Other Topics Concern   Not on file  Social History Narrative   Married   Social Drivers of Health   Financial Resource Strain: Low Risk  (03/03/2024)   Overall Financial Resource Strain (CARDIA)    Difficulty of Paying Living Expenses: Not hard at all  Food Insecurity: No Food Insecurity (03/03/2024)   Hunger Vital Sign    Worried About Running Out of Food in the Last Year: Never true    Ran Out of Food in the Last Year: Never true  Transportation Needs: No Transportation Needs (03/03/2024)   PRAPARE - Administrator, Civil Service (Medical): No    Lack of Transportation (Non-Medical): No  Physical Activity: Sufficiently Active (03/03/2024)   Exercise Vital Sign    Days of Exercise per Week: 5 days    Minutes of Exercise per Session: 30 min  Stress: No Stress Concern Present (03/03/2024)   [employer] of Occupational Health - Occupational Stress Questionnaire    Feeling of Stress: Not at all  Social Connections: Socially Integrated (03/03/2024)   Social Connection and Isolation Panel    Frequency of Communication with Friends and Family: More than three times a week    Frequency of Social Gatherings with Friends and Family: Twice a week    Attends Religious Services: More than 4 times per year    Active Member of [employer] or Organizations: Yes    Attends Engineer, Structural: More than 4 times per year    Marital Status: Married     Review of Systems  Constitutional:  Negative for appetite change and unexpected weight change.  HENT:  Negative for congestion and sinus pressure.   Respiratory:  Negative for cough, chest tightness and shortness of breath.   Cardiovascular:  Negative for chest pain, palpitations and leg swelling.  Gastrointestinal:  Negative for  abdominal pain, nausea and vomiting.  Genitourinary:  Negative for difficulty urinating and dysuria.  Musculoskeletal:  Negative for joint swelling and myalgias.  Skin:  Negative for color change and rash.  Neurological:  Negative for dizziness and headaches.  Psychiatric/Behavioral:  Negative for agitation and dysphoric mood.        Objective:     BP 110/70   Pulse 82   Temp 97.9 F (36.6 C) (Oral)   Ht 5' 11 (1.803 m)   Wt 166 lb 2 oz (75.4 kg)   SpO2 97%   BMI 23.17 kg/m  Wt Readings from Last 3  Encounters:  03/10/24 166 lb 2 oz (75.4 kg)  12/12/23 166 lb 11.2 oz (75.6 kg)  11/01/23 166 lb 6.4 oz (75.5 kg)    Physical Exam Vitals reviewed.  Constitutional:      General: She is not in acute distress.    Appearance: Normal appearance.  HENT:     Head: Normocephalic and atraumatic.     Right Ear: External ear normal.     Left Ear: External ear normal.     Mouth/Throat:     Pharynx: No oropharyngeal exudate or posterior oropharyngeal erythema.  Eyes:     General: No scleral icterus.       Right eye: No discharge.        Left eye: No discharge.     Conjunctiva/sclera: Conjunctivae normal.  Neck:     Thyroid : No thyromegaly.  Cardiovascular:     Rate and Rhythm: Normal rate and regular rhythm.  Pulmonary:     Effort: No respiratory distress.     Breath sounds: Normal breath sounds. No wheezing.  Abdominal:     General: Bowel sounds are normal.     Palpations: Abdomen is soft.     Tenderness: There is no abdominal tenderness.  Musculoskeletal:        General: No swelling or tenderness.     Cervical back: Neck supple. No tenderness.  Lymphadenopathy:     Cervical: No cervical adenopathy.  Skin:    Findings: No erythema or rash.  Neurological:     Mental Status: She is alert.  Psychiatric:        Mood and Affect: Mood normal.        Behavior: Behavior normal.         Outpatient Encounter Medications as of 03/10/2024  Medication Sig   ketorolac  (ACULAR) 0.5 % ophthalmic solution Instill 1 drop into right eye as directed  4x a day for 5 days then stop in the right eye   Omega-3 Fatty Acids (FISH OIL) 1200 MG CAPS    aspirin 81 MG EC tablet Take 1 tablet by mouth daily.   calcium -vitamin D (OSCAL-500) 500-400 MG-UNIT tablet Take 1 tablet by mouth daily.    levothyroxine (SYNTHROID) 50 MCG tablet Take 50 mcg by mouth daily.   LUMIGAN 0.01 % SOLN SMARTSIG:In Eye(s)   MAGNESIUM GLYCINATE PO Take by mouth daily.   Multiple Vitamin (MULTIVITAMIN) tablet Take 1 tablet by mouth daily.   omeprazole  (PRILOSEC) 20 MG capsule TAKE 1 CAPSULE BY MOUTH DAILY   rosuvastatin  (CRESTOR ) 10 MG tablet TAKE 1 TABLET BY MOUTH DAILY   valACYclovir  (VALTREX ) 1000 MG tablet Take 2 grams every 12 hours for 2 doses with a glass of water at the first sign of symptoms.   venlafaxine  XR (EFFEXOR -XR) 150 MG 24 hr capsule TAKE 1 CAPSULE BY MOUTH DAILY WITH BREAKFAST   [DISCONTINUED] tamoxifen  (NOLVADEX ) 20 MG tablet Take 1 tablet (20 mg total) by mouth daily.   No facility-administered encounter medications on file as of 03/10/2024.     Lab Results  Component Value Date   WBC 4.2 03/05/2024   HGB 12.0 03/05/2024   HCT 35.4 (L) 03/05/2024   PLT 225.0 03/05/2024   GLUCOSE 94 03/05/2024   CHOL 149 03/05/2024   TRIG 59.0 03/05/2024   HDL 87.80 03/05/2024   LDLCALC 49 03/05/2024   ALT 18 03/05/2024   AST 25 03/05/2024   NA 139 03/05/2024   K 4.6 03/05/2024   CL 100 03/05/2024   CREATININE 0.89 03/05/2024  BUN 18 03/05/2024   CO2 32 03/05/2024   TSH 2.51 10/30/2023   INR 0.9 10/24/2018   HGBA1C 5.8 03/05/2024    MR BRAIN W WO CONTRAST Result Date: 01/20/2024 EXAM: MRI BRAIN WITH AND WITHOUT CONTRAST 01/16/2024 01:14:19 PM TECHNIQUE: Multiplanar multisequence MRI of the head/brain was performed with and without the administration of intravenous contrast. COMPARISON: Brain MRI 01/09/2023 and earlier. CLINICAL HISTORY: 73 year old female. History of  meningioma. Yearly follow up. No symptoms. 7.6mL gadobutrol . FINDINGS: BRAIN AND VENTRICLES: No acute infarct. No acute intracranial hemorrhage. No hydrocephalus. Major vascular flow voids remain stable. Following contrast, the major dural venous sinuses are enhancing and appear to be patent. Small chronic inferior cerebellar infarcts (series 8 image 42) are stable since at least 2022. Brain volume is stable. No supratentorial cortical encephalomalacia is identified. Widely scattered cerebral white matter T2 and FLAIR hyperintensity is chronic, moderate for age, not significantly changed. Chronic infiltrative skull base meningioma with extensive chronic involvement of the sella turcica, right cavernous sinus, right anterior clinoid process, medial wall of the right middle cranial fossa, and over the dorsal clivus extending to the anterior bilateral porous acusticus. The lesion homogeneously enhances, size and configuration overall stable since 2022. A rounded and nodular exophytic component over the right anterior clinoid process has mildly increased since 2022, now 8 x 11 mm (previously 6 x 10 mm). Chronic tumor involvement at the right orbital apex and along the prechiasmatic right optic nerve is confluent. The optic nerve signal appears stable and within normal limits. Mild chronic mass effect on the mesial right temporal lobe. No convincing cerebral edema. No other areas of abnormal dural thickening or enhancement. No abnormal intra-axial enhancement. Chronic effacement of the suprasellar cistern is stable. No chronic cerebral blood products on SWI. ORBITS: Chronic tumor involvement at the right orbital apex and along the prechiasmatic right optic nerve is confluent. The optic nerve signal appears stable and within normal limits. SINUSES: Chronic tumor extension into the right sphenoid sinus is stable. Minor paranasal sinus mucosal thickening otherwise. BONES AND SOFT TISSUES: Normal background bone marrow  signal. Negative visible cervical spine. No acute soft tissue abnormality. IMPRESSION: 1. Chronic infiltrative skull base meningioma has not significantly changed since 2022; only a rounded exophytic component over the right anterior clinoid process is slightly larger (now 8 x 11 mm - previously 6 x 10 mm). 2. No new intracranial abnormality. 3. Small chronic inferior cerebellar infarcts and chronic moderate-for-age white matter disease. Electronically signed by: Helayne Hurst MD 01/20/2024 10:58 AM EDT RP Workstation: [id number]       Assessment & Plan:  Ductal carcinoma in situ (DCIS) of right breast Assessment & Plan:  Seeing Dr Jacobo DCIS - s/p lumpectomy 01/04/23. Completed XRT 03/14/23. Continues on tamoxifen  for 5 years - to complete treatment in 03/2028. 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 Completed XRT 03/14/23. Continues on tamoxifen  for 5 years - to complete treatment in 03/2028. Breast discomfort as outlined. She has been doing yoga and working out in her yard. Evaluated by Dr Camelia recently. Discussed with him. No further w/up felt warranted. Call with update. Mammogram 12/27/23 - Birads II   Iron deficiency anemia, unspecified iron deficiency anemia type Assessment & Plan: Follow cbc.    Hyperglycemia Assessment & Plan: Low carb diet and exercise. Follow met b and A1c.   Orders: -     Hepatic function panel; Future -     Hemoglobin A1c; Future -     Basic  metabolic panel with GFR; Future -     Basic metabolic panel with GFR; Future  Hypothyroidism, unspecified type Assessment & Plan: On synthroid. Follow tsh.   Orders: -     Hepatic function panel; Future -     Lipid panel; Future -     Hemoglobin A1c; Future  Meningioma [facility]) Assessment & Plan: Last MRI brain completed at [facility] on 01/16/2024 showed stable tumor size since 01/2021. She was seen in follow up by Dr Bluford, [facility], on 01/29/2024. She plans to follow up again with him in 2  years.   Gastroesophageal reflux disease without esophagitis Assessment & Plan: No upper symptoms reported. Continues on prilosec.    Decreased GFR Assessment & Plan: GFR decreased from last check. Continue to stay hydrated. Follow. Check met b in 6-8 weeks.       Allena Hamilton, MD

## 2024-03-10 NOTE — Assessment & Plan Note (Signed)
 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 Completed XRT 03/14/23. Continues on tamoxifen  for 5 years - to complete treatment in 03/2028. 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 Completed XRT 03/14/23. Continues on tamoxifen  for 5 years - to complete treatment in 03/2028. Breast discomfort as outlined. She has been doing yoga and working out in her yard. Evaluated by Dr Camelia recently. Discussed with him. No further w/up felt warranted. Call with update. Mammogram 12/27/23 - Birads II

## 2024-03-16 ENCOUNTER — Encounter: Payer: Self-pay | Admitting: Internal Medicine

## 2024-03-16 DIAGNOSIS — R944 Abnormal results of kidney function studies: Secondary | ICD-10-CM | POA: Insufficient documentation

## 2024-03-16 NOTE — Assessment & Plan Note (Signed)
 Low-carb diet and exercise.  Follow met b and A1c.

## 2024-03-16 NOTE — Assessment & Plan Note (Signed)
 Follow cbc.

## 2024-03-16 NOTE — Assessment & Plan Note (Signed)
 GFR decreased from last check. Continue to stay hydrated. Follow. Check met b in 6-8 weeks.

## 2024-03-16 NOTE — Assessment & Plan Note (Signed)
 Last MRI brain completed at [facility] on 01/16/2024 showed stable tumor size since 01/2021. She was seen in follow up by Dr Bluford, [facility], on 01/29/2024. She plans to follow up again with him in 2 years.

## 2024-03-16 NOTE — Assessment & Plan Note (Signed)
On synthroid.  Follow tsh.   

## 2024-03-16 NOTE — Assessment & Plan Note (Signed)
 No upper symptoms reported. Continues on prilosec.

## 2024-03-21 ENCOUNTER — Ambulatory Visit: Payer: Medicare HMO

## 2024-03-21 DIAGNOSIS — H40053 Ocular hypertension, bilateral: Secondary | ICD-10-CM | POA: Diagnosis not present

## 2024-03-21 DIAGNOSIS — Z0000 Encounter for general adult medical examination without abnormal findings: Secondary | ICD-10-CM | POA: Diagnosis not present

## 2024-03-21 NOTE — Progress Notes (Signed)
 Chief Complaint  Patient presents with   Medicare Wellness     Subjective:   Sarah Perry is a 73 y.o. female who presents for a Medicare Annual Wellness Visit.  Visit info / Clinical Intake: Medicare Wellness Visit Type:: Subsequent Annual Wellness Visit Persons participating in visit and providing information:: patient Medicare Wellness Visit Mode:: Telephone If telephone:: video declined Since this visit was completed virtually, some vitals may be partially provided or unavailable. Missing vitals are due to the limitations of the virtual format.: Unable to obtain vitals - no equipment If Telephone or Video please confirm:: I connected with patient using audio/video enable telemedicine. I verified patient identity with two identifiers, discussed telehealth limitations, and patient agreed to proceed. Patient Location:: home Provider Location:: home Interpreter Needed?: No Pre-visit prep was completed: yes AWV questionnaire completed by patient prior to visit?: yes Date:: 03/17/24 Living arrangements:: lives with spouse/significant other Patient's Overall Health Status Rating: very good Typical amount of pain: some Does pain affect daily life?: no  Dietary Habits and Nutritional Risks How many meals a day?: 3 Eats fruit and vegetables daily?: yes Most meals are obtained by: preparing own meals Diabetic:: no  Functional Status Activities of Daily Living (to include ambulation/medication): Independent Ambulation: Independent Medication Administration: Independent Home Management (perform basic housework or laundry): Independent Manage your own finances?: yes Primary transportation is: driving Concerns about vision?: no *vision screening is required for WTM* Concerns about hearing?: no Uses hearing aids?: (!) yes  Fall Screening Falls in the past year?: 0 Number of falls in past year: 0 Was there an injury with Fall?: 0 Fall Risk Category Calculator: 0 Patient Fall  Risk Level: Low Fall Risk  Fall Risk Patient at Risk for Falls Due to: No Fall Risks Fall risk Follow up: Falls evaluation completed; Education provided; Falls prevention discussed  Home and Transportation Safety: All rugs have non-skid backing?: yes All stairs or steps have railings?: yes Grab bars in the bathtub or shower?: (!) no Have non-skid surface in bathtub or shower?: yes Good home lighting?: yes Regular seat belt use?: yes Hospital stays in the last year:: no  Cognitive Assessment Difficulty concentrating, remembering, or making decisions? : no Will 6CIT or Mini Cog be Completed: yes What year is it?: 0 points What month is it?: 0 points About what time is it?: 0 points Count backwards from 20 to 1: 0 points Say the months of the year in reverse: 0 points Repeat the address phrase from earlier: 0 points 6 CIT Score: 0 points  Advance Directives (For Healthcare) Does Patient Have a Medical Advance Directive?: No Does patient want to make changes to medical advance directive?: No - Patient declined Type of Advance Directive: Healthcare Power of Holdrege; Living will Copy of Healthcare Power of Attorney in Chart?: No - copy requested Copy of Living Will in Chart?: No - copy requested Would patient like information on creating a medical advance directive?: No - Patient declined    Allergies (verified) Penicillins   Current Medications (verified) Outpatient Encounter Medications as of 03/21/2024  Medication Sig   aspirin 81 MG EC tablet Take 1 tablet by mouth daily.   calcium -vitamin D (OSCAL-500) 500-400 MG-UNIT tablet Take 1 tablet by mouth daily.    ketorolac (ACULAR) 0.5 % ophthalmic solution Instill 1 drop into right eye as directed  4x a day for 5 days then stop in the right eye (Patient taking differently: 1 drop.)   levothyroxine (SYNTHROID) 50 MCG tablet Take 50  mcg by mouth daily.   LUMIGAN 0.01 % SOLN SMARTSIG:In Eye(s)   MAGNESIUM GLYCINATE PO Take by  mouth daily.   Multiple Vitamin (MULTIVITAMIN) tablet Take 1 tablet by mouth daily.   Omega-3 Fatty Acids (FISH OIL) 1200 MG CAPS    omeprazole  (PRILOSEC) 20 MG capsule TAKE 1 CAPSULE BY MOUTH DAILY   rosuvastatin  (CRESTOR ) 10 MG tablet TAKE 1 TABLET BY MOUTH DAILY   tamoxifen  (NOLVADEX ) 20 MG tablet TAKE 1 TABLET BY MOUTH DAILY   valACYclovir  (VALTREX ) 1000 MG tablet Take 2 grams every 12 hours for 2 doses with a glass of water at the first sign of symptoms. (Patient taking differently: Take 1,000 mg by mouth 2 (two) times daily. Take 2 grams every 12 hours for 2 doses with a glass of water at the first sign of symptoms.)   venlafaxine  XR (EFFEXOR -XR) 150 MG 24 hr capsule TAKE 1 CAPSULE BY MOUTH DAILY WITH BREAKFAST   No facility-administered encounter medications on file as of 03/21/2024.    History: Past Medical History:  Diagnosis Date   Allergy 1960   Penicillin   Basal cell carcinoma 12/08/2014   L spinal mid back    Basal cell carcinoma 12/08/2014   R spinal upper back    Basal cell carcinoma 12/08/2014   L upper sternum    Basal cell carcinoma 02/11/2018   L med pretibial    Basal cell carcinoma 02/11/2018   L lower pretibial    Basal cell carcinoma 09/17/2018   R shoulder    Basal cell carcinoma 09/17/2018   L lat pretibial    Basal cell carcinoma 02/18/2019   R parietal scalp    Basal cell carcinoma 02/28/2019   Central nasal tip    Basal cell carcinoma 09/23/2019   R mid chin    Basal cell carcinoma 09/23/2019   R clavicle    Basal cell carcinoma 10/14/2020   left superior medial forehead, Moh's 02/07/21   Basal cell carcinoma 10/14/2020   right medial cheek, Moh's 02/07/21   Basal cell carcinoma 11/08/2021   L nasal ala, Mohs 01/09/2022   BCC (basal cell carcinoma of skin) 10/14/2020   L preauricular, exc 12/27/20   BCC (basal cell carcinoma of skin) 06/06/2022   left popliteal, edc   BCC (basal cell carcinoma of skin) 06/06/2022   right posterior ankle,  Superficial, edc   Degenerative arthritis    hips, knees   GERD (gastroesophageal reflux disease)    Heart murmur 1962?   Pediatrician identified it when lying down only   Varicella zoster 01/2008   Past Surgical History:  Procedure Laterality Date   BASAL CELL CARCINOMA EXCISION     BREAST BIOPSY Right 11/29/2022   Right Stereo Bx, X clip-  HIGH-GRADE DUCTAL CARCINOMA IN SITU (DCIS), COMEDO TYPE WITH CALCIFICATION   BREAST BIOPSY Right 11/29/2022   MM RT BREAST BX W LOC DEV 1ST LESION IMAGE BX SPEC STEREO GUIDE 11/29/2022 [facility]   BREAST BIOPSY  01/03/2023   MM RT RADIOACTIVE SEED LOC MAMMO GUIDE 01/03/2023 [facility]   BREAST LUMPECTOMY WITH RADIOACTIVE SEED LOCALIZATION Right 01/04/2023   Procedure: RIGHT BREAST SEED GUIDED LUMPECTOMY;  Surgeon: Ebbie Cough, MD;  Location: [facility];  Service: General;  Laterality: Right;  LMA   DILATION AND CURETTAGE OF UTERUS     TONSILECTOMY/ADENOIDECTOMY WITH MYRINGOTOMY     Family History  Problem Relation Age of Onset   Ovarian cancer Mother    Hypercholesterolemia Mother  Arthritis Mother    Cancer Mother    Hearing loss Mother    Dementia Father    Prostate cancer Father    Cancer Father    Stroke Father    Vision loss Father    Melanoma Sister    Hearing loss Sister    Breast cancer Sister 37   Pancreatic cancer Maternal Aunt    Cancer Maternal Aunt    Breast cancer Paternal Aunt    ADD / ADHD Son    Cancer Sister    Cancer Sister    Hearing loss Sister    Diabetes Maternal Uncle    Social History   Occupational History   Not on file  Tobacco Use   Smoking status: Never   Smokeless tobacco: Never  Vaping Use   Vaping status: Never Used  Substance and Sexual Activity   Alcohol use: Yes    Alcohol/week: 0.0 standard drinks of alcohol    Comment: glass of wine daily   Drug use: No   Sexual activity: Yes    Birth control/protection: None   Tobacco Counseling Counseling  given: Not Answered  SDOH Screenings   Food Insecurity: No Food Insecurity (03/21/2024)  Housing: Low Risk (03/21/2024)  Transportation Needs: No Transportation Needs (03/21/2024)  Utilities: Not At Risk (03/21/2024)  Alcohol Screen: Low Risk (03/03/2024)  Depression (PHQ2-9): Low Risk (12/12/2023)  Financial Resource Strain: Low Risk (03/03/2024)  Physical Activity: Sufficiently Active (03/21/2024)  Social Connections: Socially Integrated (03/21/2024)  Stress: No Stress Concern Present (03/21/2024)  Tobacco Use: Low Risk (03/16/2024)  Health Literacy: Adequate Health Literacy (03/21/2024)   See flowsheets for full screening details  Depression Screen PHQ 2 & 9 Depression Scale- Over the past 2 weeks, how often have you been bothered by any of the following problems? Little interest or pleasure in doing things: 0 Feeling down, depressed, or hopeless (PHQ Adolescent also includes...irritable): 0 PHQ-2 Total Score: 0     Goals Addressed             This Visit's Progress    Working on cutting down salt and eating foods with less preservatives.               Objective:    There were no vitals filed for this visit. There is no height or weight on file to calculate BMI.  Hearing/Vision screen No results found. Immunizations and Health Maintenance Health Maintenance  Topic Date Due   COVID-19 Vaccine (10 - 2025-26 season) 01/10/2024   Medicare Annual Wellness (AWV)  03/19/2024   Mammogram  12/26/2025   Fecal DNA (Cologuard)  07/05/2026   DTaP/Tdap/Td (2 - Td or Tdap) 03/11/2030   Pneumococcal Vaccine: 50+ Years  Completed   Influenza Vaccine  Completed   Bone Density Scan  Completed   Hepatitis C Screening  Completed   Zoster Vaccines- Shingrix  Completed   Meningococcal B Vaccine  Aged Out   Colonoscopy  Discontinued        Assessment/Plan:  This is a routine wellness examination for Sarah Perry.  Patient Care Team: Glendia Shad, MD as PCP - General (Internal  Medicine) Georgina Shasta POUR, RN as Oncology Nurse Navigator Jacobo, Evalene PARAS, MD as Consulting Physician (Oncology) Lenn Aran, MD as Consulting Physician (Radiation Oncology) Ebbie Cough, MD as Consulting Physician (General Surgery)  I have personally reviewed and noted the following in the patients chart:   Medical and social history Use of alcohol, tobacco or illicit drugs  Current medications and supplements including opioid  prescriptions. Functional ability and status Nutritional status Physical activity Advanced directives List of other physicians Hospitalizations, surgeries, and ER visits in previous 12 months Vitals Screenings to include cognitive, depression, and falls Referrals and appointments  No orders of the defined types were placed in this encounter.  In addition, I have reviewed and discussed with patient certain preventive protocols, quality metrics, and best practice recommendations. A written personalized care plan for preventive services as well as general preventive health recommendations were provided to patient.   Arnette LOISE Hoots, CMA   03/21/2024   No follow-ups on file.  After Visit Summary: (Declined) Due to this being a telephonic visit, with patients personalized plan was offered to patient but patient Declined AVS at this time   Nurse Notes: Patient is doing great, working with her husband to  cut down on salt and processed foods due to his CKD. She will look into the new Covid vaccine and get it as well.

## 2024-03-21 NOTE — Patient Instructions (Signed)
 Sarah Perry,  Thank you for taking the time for your Medicare Wellness Visit. I appreciate your continued commitment to your health goals. Please review the care plan we discussed, and feel free to reach out if I can assist you further.  Please note that Annual Wellness Visits do not include a physical exam. Some assessments may be limited, especially if the visit was conducted virtually. If needed, we may recommend an in-person follow-up with your provider.  Ongoing Care Seeing your primary care provider every 3 to 6 months helps us  monitor your health and provide consistent, personalized care. PCP appt 04/23/2024 and AWV 03/27/2025.  Referrals If a referral was made during today's visit and you haven't received any updates within two weeks, please contact the referred provider directly to check on the status.  Recommended Screenings:  Health Maintenance  Topic Date Due   COVID-19 Vaccine (10 - 2025-26 season) 01/10/2024   Medicare Annual Wellness Visit  03/19/2024   Breast Cancer Screening  12/26/2025   Cologuard (Stool DNA test)  07/05/2026   DTaP/Tdap/Td vaccine (2 - Td or Tdap) 03/11/2030   Pneumococcal Vaccine for age over 46  Completed   Flu Shot  Completed   Osteoporosis screening with Bone Density Scan  Completed   Hepatitis C Screening  Completed   Zoster (Shingles) Vaccine  Completed   Meningitis B Vaccine  Aged Out   Colon Cancer Screening  Discontinued       12/12/2023    1:41 PM  Advanced Directives  Does Patient Have a Medical Advance Directive? No    Vision: Annual vision screenings are recommended for early detection of glaucoma, cataracts, and diabetic retinopathy. These exams can also reveal signs of chronic conditions such as diabetes and high blood pressure.  Dental: Annual dental screenings help detect early signs of oral cancer, gum disease, and other conditions linked to overall health, including heart disease and diabetes.  Please see the attached  documents for additional preventive care recommendations.

## 2024-04-23 ENCOUNTER — Other Ambulatory Visit (INDEPENDENT_AMBULATORY_CARE_PROVIDER_SITE_OTHER)

## 2024-04-23 DIAGNOSIS — R739 Hyperglycemia, unspecified: Secondary | ICD-10-CM

## 2024-04-24 ENCOUNTER — Encounter: Payer: Self-pay | Admitting: Radiation Oncology

## 2024-04-24 ENCOUNTER — Ambulatory Visit
Admission: RE | Admit: 2024-04-24 | Discharge: 2024-04-24 | Disposition: A | Discharge status: Home / Self Care | Payer: Self-pay | Source: Ambulatory Visit | Attending: Radiation Oncology | Admitting: Radiation Oncology

## 2024-04-24 VITALS — BP 131/80 | HR 80 | Temp 97.3°F | Resp 14 | Ht 71.0 in | Wt 167.3 lb

## 2024-04-24 DIAGNOSIS — Z86011 Personal history of benign neoplasm of the brain: Secondary | ICD-10-CM | POA: Diagnosis not present

## 2024-04-24 DIAGNOSIS — D0511 Intraductal carcinoma in situ of right breast: Secondary | ICD-10-CM | POA: Diagnosis present

## 2024-04-24 DIAGNOSIS — Z170 Estrogen receptor positive status [ER+]: Secondary | ICD-10-CM | POA: Diagnosis not present

## 2024-04-24 DIAGNOSIS — Z79810 Long term (current) use of selective estrogen receptor modulators (SERMs): Secondary | ICD-10-CM | POA: Diagnosis not present

## 2024-04-24 DIAGNOSIS — Z923 Personal history of irradiation: Secondary | ICD-10-CM | POA: Insufficient documentation

## 2024-04-24 LAB — BASIC METABOLIC PANEL WITH GFR
BUN: 19 mg/dL (ref 6–23)
CO2: 32 meq/L (ref 19–32)
Calcium: 9.2 mg/dL (ref 8.4–10.5)
Chloride: 98 meq/L (ref 96–112)
Creatinine, Ser: 0.84 mg/dL (ref 0.40–1.20)
GFR: 69.95 mL/min
Glucose, Bld: 87 mg/dL (ref 70–99)
Potassium: 4.8 meq/L (ref 3.5–5.1)
Sodium: 135 meq/L (ref 135–145)

## 2024-04-24 NOTE — Progress Notes (Signed)
 Radiation Oncology Follow up Note  Name: Sarah Perry   Date:   04/24/2024 MRN:  [id number] DOB: Nov 18, 1950    This 74 y.o. 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  Seen today in routine follow-up she is doing well.  She specifically denies breast tenderness cough or bone pain..  She had mammograms back in September which I have reviewed were BI-RADS Category 2 benign.  She also recently had in October an MRI of her brain showing a chronic infiltrative skull base meningioma not significantly changed since 2022 no other new intracranial abnormalities are noted.  She is currently on tamoxifen  tolerating that well without side effect.  COMPLICATIONS OF TREATMENT: none  FOLLOW UP COMPLIANCE: keeps appointments   PHYSICAL EXAM:  BP 131/80   Pulse 80   Temp (!) 97.3 F (36.3 C) (Tympanic)   Resp 14   Ht 5' 11 (1.803 m)   Wt 167 lb 4.8 oz (75.9 kg)   BMI 23.33 kg/m  Lungs are clear to A&P cardiac examination essentially unremarkable with regular rate and rhythm. No dominant mass or nodularity is noted in either breast in 2 positions examined. Incision is well-healed. No axillary or supraclavicular adenopathy is appreciated. Cosmetic result is excellent.  Well-developed well-nourished patient in NAD. HEENT reveals PERLA, EOMI, discs not visualized.  Oral cavity is clear. No oral mucosal lesions are identified. Neck is clear without evidence of cervical or supraclavicular adenopathy. Lungs are clear to A&P. Cardiac examination is essentially unremarkable with regular rate and rhythm without murmur rub or thrill. Abdomen is benign with no organomegaly or masses noted. Motor sensory and DTR  levels are equal and symmetric in the upper and lower extremities. Cranial nerves II through XII are grossly intact. Proprioception is intact. No peripheral adenopathy or edema is identified. No motor or sensory levels are noted. Crude visual fields are within normal range.  RADIOLOGY RESULTS: Mammograms and MRI brain reviewed compatible with above-stated findings  PLAN: Present time patient is doing well now about 13 months from whole breast radiation I am pleased with her overall progress.  She continues on tamoxifen  without side effect.  Of asked to see her back in 1 year for follow-up.  Patient knows to call with any concerns at any time.  I would like to take this opportunity to thank you for allowing me to participate in the care of your patient.SABRA Marcey Penton, MD

## 2024-04-25 ENCOUNTER — Ambulatory Visit: Payer: Self-pay | Admitting: Internal Medicine

## 2024-06-10 ENCOUNTER — Ambulatory Visit: Admitting: Oncology

## 2024-07-07 ENCOUNTER — Other Ambulatory Visit

## 2024-07-09 ENCOUNTER — Encounter: Admitting: Internal Medicine

## 2024-07-22 ENCOUNTER — Ambulatory Visit: Admitting: Dermatology

## 2025-03-27 ENCOUNTER — Ambulatory Visit

## 2025-04-30 ENCOUNTER — Ambulatory Visit: Admitting: Radiation Oncology
